# Patient Record
Sex: Male | Born: 1965
Health system: Southern US, Community
[De-identification: ages and names within clinical notes are randomized; demographics above are authoritative.]

## PROBLEM LIST (undated history)

## (undated) DIAGNOSIS — M722 Plantar fascial fibromatosis: Secondary | ICD-10-CM

## (undated) DIAGNOSIS — Z8639 Personal history of other endocrine, nutritional and metabolic disease: Secondary | ICD-10-CM

## (undated) DIAGNOSIS — G629 Polyneuropathy, unspecified: Secondary | ICD-10-CM

## (undated) DIAGNOSIS — E119 Type 2 diabetes mellitus without complications: Secondary | ICD-10-CM

## (undated) DIAGNOSIS — I10 Essential (primary) hypertension: Secondary | ICD-10-CM

## (undated) DIAGNOSIS — N289 Disorder of kidney and ureter, unspecified: Secondary | ICD-10-CM

## (undated) DIAGNOSIS — N200 Calculus of kidney: Secondary | ICD-10-CM

## (undated) DIAGNOSIS — M1990 Unspecified osteoarthritis, unspecified site: Secondary | ICD-10-CM

## (undated) DIAGNOSIS — N183 Chronic kidney disease, stage 3 unspecified: Secondary | ICD-10-CM

## (undated) DIAGNOSIS — E785 Hyperlipidemia, unspecified: Secondary | ICD-10-CM

## (undated) DIAGNOSIS — R351 Nocturia: Secondary | ICD-10-CM

## (undated) DIAGNOSIS — M549 Dorsalgia, unspecified: Secondary | ICD-10-CM

## (undated) DIAGNOSIS — N201 Calculus of ureter: Secondary | ICD-10-CM

## (undated) DIAGNOSIS — G8929 Other chronic pain: Secondary | ICD-10-CM

## (undated) DIAGNOSIS — M542 Cervicalgia: Secondary | ICD-10-CM

## (undated) HISTORY — PX: CARDIOVASCULAR STRESS TEST: SHX262

## (undated) HISTORY — DX: Calculus of kidney: N20.0

## (undated) HISTORY — PX: ANTERIOR CERVICAL DECOMP/DISCECTOMY FUSION: SHX1161

## (undated) HISTORY — DX: Personal history of other endocrine, nutritional and metabolic disease: Z86.39

## (undated) HISTORY — DX: Essential (primary) hypertension: I10

---

## 2013-03-04 ENCOUNTER — Emergency Department: Payer: Self-pay | Admitting: Emergency Medicine

## 2013-03-04 LAB — CBC
HCT: 41.8 % (ref 40.0–52.0)
MCH: 30.5 pg (ref 26.0–34.0)
MCHC: 35.2 g/dL (ref 32.0–36.0)
RBC: 4.81 10*6/uL (ref 4.40–5.90)
WBC: 9 10*3/uL (ref 3.8–10.6)

## 2013-03-04 LAB — BASIC METABOLIC PANEL
Anion Gap: 7 (ref 7–16)
Calcium, Total: 8.4 mg/dL — ABNORMAL LOW (ref 8.5–10.1)
Co2: 26 mmol/L (ref 21–32)
Creatinine: 1.36 mg/dL — ABNORMAL HIGH (ref 0.60–1.30)
EGFR (African American): >60
Glucose: 239 mg/dL — ABNORMAL HIGH (ref 65–99)
Sodium: 135 mmol/L — ABNORMAL LOW (ref 136–145)

## 2013-03-04 LAB — URINALYSIS, COMPLETE
Glucose,UR: >=500 mg/dL (ref 0–75)
Leukocyte Esterase: NEGATIVE
Nitrite: NEGATIVE
Protein: NEGATIVE
RBC,UR: 10 /HPF (ref 0–5)
Specific Gravity: 1.029 (ref 1.003–1.030)
Squamous Epithelial: 1
WBC UR: 10 /HPF (ref 0–5)

## 2013-04-13 ENCOUNTER — Emergency Department: Payer: Self-pay

## 2013-04-13 LAB — URINALYSIS, COMPLETE
Glucose,UR: >=500 mg/dL (ref 0–75)
Ketone: NEGATIVE
Protein: 30
Specific Gravity: 1.037 (ref 1.003–1.030)
Squamous Epithelial: NONE SEEN
WBC UR: 1 /HPF (ref 0–5)

## 2013-04-13 LAB — CBC
HCT: 41.9 % (ref 40.0–52.0)
HGB: 14.9 g/dL (ref 13.0–18.0)
MCH: 30.1 pg (ref 26.0–34.0)
MCV: 85 fL (ref 80–100)
RBC: 4.94 10*6/uL (ref 4.40–5.90)
RDW: 13.1 % (ref 11.5–14.5)

## 2013-04-13 LAB — BASIC METABOLIC PANEL
Calcium, Total: 9.3 mg/dL (ref 8.5–10.1)
Chloride: 104 mmol/L (ref 98–107)
Co2: 28 mmol/L (ref 21–32)
EGFR (African American): >60
Osmolality: 284 (ref 275–301)

## 2015-06-14 ENCOUNTER — Ambulatory Visit (INDEPENDENT_AMBULATORY_CARE_PROVIDER_SITE_OTHER): Payer: 59

## 2015-06-14 ENCOUNTER — Ambulatory Visit (INDEPENDENT_AMBULATORY_CARE_PROVIDER_SITE_OTHER): Payer: 59 | Admitting: Podiatry

## 2015-06-14 DIAGNOSIS — M79673 Pain in unspecified foot: Secondary | ICD-10-CM

## 2015-06-14 DIAGNOSIS — M722 Plantar fascial fibromatosis: Secondary | ICD-10-CM

## 2015-06-14 MED ORDER — DICLOFENAC SODIUM 75 MG PO TBEC: 75.0000 mg | DELAYED_RELEASE_TABLET | Freq: Two times a day (BID) | ORAL | Status: DC

## 2015-06-14 MED ORDER — TRIAMCINOLONE ACETONIDE 10 MG/ML IJ SUSP
10.0000 mg | Freq: Once | INTRAMUSCULAR | Status: AC
  Administered 2015-06-14: 10 mg

## 2015-06-14 NOTE — Progress Notes (Signed)
   Subjective:    Patient ID: Brian Butler, male    DOB: Jul 03, 1966, 49 y.o.   MRN: [id number]  HPI Pt presents with bilateral heel pain left over right. He states the pain is worse when ambulating after resting. He has tried otc orthotics, nsaids and stretching with no relief. This has been an ongoing problem worsening over several months   Review of Systems  Musculoskeletal: Positive for myalgias, back pain and gait problem.       Objective:   Physical Exam        Assessment & Plan:

## 2015-06-14 NOTE — Progress Notes (Signed)
Subjective:     Patient ID: Brian Butler, male   DOB: [birth year], 49 y.o.   MRN: [id number]  HPI patient states that I have had terrible pain in my plantar fascia and heel left over right for about 6 months and is been worsening recently. Patient states that he's tried different treatment options and tried support without relief of symptoms   Review of Systems  All other systems reviewed and are negative.      Objective:   Physical Exam  Constitutional: He is oriented to person, place, and time.  Cardiovascular: Intact distal pulses.   Musculoskeletal: Normal range of motion.  Neurological: He is oriented to person, place, and time.  Skin: Skin is warm.  Nursing note and vitals reviewed.  neurovascular status intact muscle strength adequate with range of motion within normal limits. Patient's noted to have significant discomfort in the plantar fascia and heel left over right with inflammation and fluid buildup noted around the medial band and moderate depression of the arch noted upon weightbearing. Patient is noted to have good digital perfusion and is well oriented 3     Assessment:     Plantar fasciitis with acute inflammation heel left over right with fluid buildup around the medial    Plan:     H&P and x-ray reviewed and today I injected the left plantar fascia 3 mg Kenalog 5 mg Xylocaine and applied fascial brace with instructions on usage. Patient was given instructions on physical therapy was placed on diclofenac 75 mg twice a day and will be rechecked again in 3 weeks

## 2015-06-14 NOTE — Patient Instructions (Addendum)

## 2015-07-05 ENCOUNTER — Ambulatory Visit: Payer: 59 | Admitting: Podiatry

## 2015-09-05 ENCOUNTER — Ambulatory Visit (INDEPENDENT_AMBULATORY_CARE_PROVIDER_SITE_OTHER): Payer: 59 | Admitting: Family Medicine

## 2015-09-05 ENCOUNTER — Encounter: Payer: Self-pay | Admitting: Family Medicine

## 2015-09-05 VITALS — BP 158/86 | HR 72 | Ht 72.0 in | Wt 225.8 lb

## 2015-09-05 DIAGNOSIS — M2550 Pain in unspecified joint: Secondary | ICD-10-CM

## 2015-09-05 DIAGNOSIS — N200 Calculus of kidney: Secondary | ICD-10-CM | POA: Diagnosis not present

## 2015-09-05 DIAGNOSIS — Z823 Family history of stroke: Secondary | ICD-10-CM

## 2015-09-05 DIAGNOSIS — M659 Synovitis and tenosynovitis, unspecified: Secondary | ICD-10-CM | POA: Diagnosis not present

## 2015-09-05 DIAGNOSIS — Z8639 Personal history of other endocrine, nutritional and metabolic disease: Secondary | ICD-10-CM | POA: Diagnosis not present

## 2015-09-05 DIAGNOSIS — I10 Essential (primary) hypertension: Secondary | ICD-10-CM | POA: Diagnosis not present

## 2015-09-05 DIAGNOSIS — E1165 Type 2 diabetes mellitus with hyperglycemia: Secondary | ICD-10-CM | POA: Diagnosis not present

## 2015-09-05 DIAGNOSIS — R5383 Other fatigue: Secondary | ICD-10-CM

## 2015-09-05 DIAGNOSIS — E119 Type 2 diabetes mellitus without complications: Secondary | ICD-10-CM | POA: Insufficient documentation

## 2015-09-05 DIAGNOSIS — R739 Hyperglycemia, unspecified: Secondary | ICD-10-CM | POA: Diagnosis not present

## 2015-09-05 DIAGNOSIS — IMO0001 Reserved for inherently not codable concepts without codable children: Secondary | ICD-10-CM

## 2015-09-05 DIAGNOSIS — R358 Other polyuria: Secondary | ICD-10-CM

## 2015-09-05 DIAGNOSIS — R3589 Other polyuria: Secondary | ICD-10-CM

## 2015-09-05 DIAGNOSIS — M778 Other enthesopathies, not elsewhere classified: Secondary | ICD-10-CM

## 2015-09-05 HISTORY — DX: Essential (primary) hypertension: I10

## 2015-09-05 HISTORY — DX: Personal history of other endocrine, nutritional and metabolic disease: Z86.39

## 2015-09-05 LAB — POCT URINALYSIS DIPSTICK
Bilirubin, UA: NEGATIVE
Blood, UA: NEGATIVE
KETONES UA: NEGATIVE
Leukocytes, UA: NEGATIVE
Nitrite, UA: NEGATIVE
Protein, UA: NEGATIVE
SPEC GRAV UA: 1.015
Urobilinogen, UA: 0.2
pH, UA: 5

## 2015-09-05 LAB — GLUCOSE, POCT (MANUAL RESULT ENTRY): POC Glucose: 239 mg/dl — AB (ref 70–99)

## 2015-09-05 MED ORDER — METFORMIN HCL 500 MG PO TABS: 500.0000 mg | ORAL_TABLET | Freq: Two times a day (BID) | ORAL | Status: DC

## 2015-09-05 MED ORDER — LISINOPRIL 10 MG PO TABS: 10.0000 mg | ORAL_TABLET | Freq: Every day | ORAL | Status: DC

## 2015-09-05 NOTE — Progress Notes (Signed)
Date:  09/05/2015   Name:  Brian Butler   DOB:  March 10, 1966   MRN:  [id number]  PCP:  Adline Potter, MD    Chief Complaint: Establish Care   History of Present Illness:  This is a 49 y.o. male with hx diabetes and HTN previously controlled with weight loss but has regained weight now. Had sweet tea before office visit. Currently c/o polyuria, diffuse arthralgia, fatigue, L flank pain, and R elbow pain. Hx low testosterone on Androgel 3 yrs ago, quit due to palpitations. Last tetanus 1 yr ago, declines flu imm. Recent cortisone injections both heels by podiatry for plantar fasciitis (help for a while).  Review of Systems:  Review of Systems  Constitutional: Negative for fever and chills.  Respiratory: Negative for shortness of breath.   Cardiovascular: Negative for chest pain and leg swelling.  Gastrointestinal: Negative for abdominal pain.  Genitourinary: Negative for hematuria and testicular pain.  Skin: Negative for rash.  Neurological: Negative for syncope and light-headedness.    Patient Active Problem List   Diagnosis Date Noted  . FH: stroke 09/05/2015  . Hypertension 09/05/2015  . Nephrolithiasis 09/05/2015  . Hx of hypotestosteronemia 09/05/2015  . Hyperglycemia 09/05/2015  . Uncontrolled type 2 diabetes mellitus without complication ([place]) 25/00/3704    Prior to Admission medications   Medication Sig Start Date End Date Taking? Authorizing Provider  lisinopril (PRINIVIL,ZESTRIL) 10 MG tablet Take 1 tablet (10 mg total) by mouth daily. 09/05/15   Adline Potter, MD  metFORMIN (GLUCOPHAGE) 500 MG tablet Take 1 tablet (500 mg total) by mouth 2 (two) times daily with a meal. 09/05/15   Adline Potter, MD    No Known Allergies  Past Surgical History  Procedure Laterality Date  . Cervical disc surgery  2006    Social History  Substance Use Topics  . Smoking status: Never Smoker   . Smokeless tobacco: None  . Alcohol Use: No    Family History  Problem Relation Age  of Onset  . Stroke Father     Medication list has been reviewed and updated.  Physical Examination: BP 158/86 mmHg  Pulse 72  Ht 6' (1.829 m)  Wt 225 lb 12.8 oz (102.422 kg)  BMI 30.62 kg/m2  Physical Exam  Constitutional: He is oriented to person, place, and time. He appears well-developed and well-nourished.  HENT:  Head: Normocephalic and atraumatic.  Right Ear: External ear normal.  Left Ear: External ear normal.  Mouth/Throat: Oropharynx is clear and moist.  B TM's clear  Eyes: Conjunctivae and EOM are normal. Pupils are equal, round, and reactive to light. No scleral icterus.  Neck: Neck supple. No thyromegaly present.  Cardiovascular: Normal rate, regular rhythm, normal heart sounds and intact distal pulses.   Pulmonary/Chest: Effort normal and breath sounds normal.  Abdominal: Soft. He exhibits no distension and no mass. There is no tenderness.  Genitourinary:  Mild L CVAT  Musculoskeletal: He exhibits no edema.  R elbow tender at biceps and triceps insertions  Lymphadenopathy:    He has no cervical adenopathy.  Neurological: He is alert and oriented to person, place, and time. Coordination normal.  Skin: Skin is warm and dry.  Psychiatric: He has a normal mood and affect. His behavior is normal.  Nursing note and vitals reviewed.   Assessment and Plan:  1. Uncontrolled type 2 diabetes mellitus without complication, without long-term current use of insulin (HCC) UA 2000+ glucose, FSBG 239, likely recurrent due to weight gain but cortisone and diet  may have excerbated, begin metformin, consider MNT  2. Essential hypertension Poor control, begin lisinopril - Comprehensive metabolic panel  3. Nephrolithiasis Suspect L flank pain due to polyuria which should improve with DM control  4. Left elbow tendonitis Consider NSAID if blood work ok  5. Other fatigue Suspect due to uncontrolled DM - CBC - TSH  6. Arthralgia No inflammatory sxs - Sed Rate  (ESR)  7. FH: stroke Father age 33  8. Hx of hypotestosteronemia On Androgel in past - Testosterone  9. Hyperglycemia - HgB A1c  10. Polyuria - POCT urinalysis dipstick - POCT Glucose (CBG)  Return in about 2 weeks (around 09/19/2015).  Satira Anis. Nichola Cieslinski, [facility]  09/05/2015

## 2015-09-06 LAB — COMPREHENSIVE METABOLIC PANEL
ALT: 67 IU/L — ABNORMAL HIGH (ref 0–44)
AST: 38 IU/L (ref 0–40)
Albumin/Globulin Ratio: 1.5 (ref 1.1–2.5)
Albumin: 4.2 g/dL (ref 3.5–5.5)
Alkaline Phosphatase: 88 IU/L (ref 39–117)
BUN/Creatinine Ratio: 11 (ref 9–20)
BUN: 14 mg/dL (ref 6–24)
Bilirubin Total: 1.1 mg/dL (ref 0.0–1.2)
CALCIUM: 9.8 mg/dL (ref 8.7–10.2)
CO2: 24 mmol/L (ref 18–29)
CREATININE: 1.26 mg/dL (ref 0.76–1.27)
Chloride: 96 mmol/L — ABNORMAL LOW (ref 97–108)
GFR calc Af Amer: 77 mL/min/{1.73_m2} (ref >59)
GFR, EST NON AFRICAN AMERICAN: 67 mL/min/{1.73_m2} (ref >59)
GLOBULIN, TOTAL: 2.8 g/dL (ref 1.5–4.5)
GLUCOSE: 255 mg/dL — AB (ref 65–99)
Potassium: 4.4 mmol/L (ref 3.5–5.2)
Sodium: 138 mmol/L (ref 134–144)
Total Protein: 7 g/dL (ref 6.0–8.5)

## 2015-09-06 LAB — CBC
HEMOGLOBIN: 14.7 g/dL (ref 12.6–17.7)
Hematocrit: 43.4 % (ref 37.5–51.0)
MCH: 29.9 pg (ref 26.6–33.0)
MCHC: 33.9 g/dL (ref 31.5–35.7)
MCV: 88 fL (ref 79–97)
Platelets: 196 10*3/uL (ref 150–379)
RBC: 4.92 x10E6/uL (ref 4.14–5.80)
RDW: 13.9 % (ref 12.3–15.4)
WBC: 7 10*3/uL (ref 3.4–10.8)

## 2015-09-06 LAB — HEMOGLOBIN A1C
Est. average glucose Bld gHb Est-mCnc: 174 mg/dL
HEMOGLOBIN A1C: 7.7 % — AB (ref 4.8–5.6)

## 2015-09-06 LAB — TESTOSTERONE: Testosterone: 244 ng/dL — ABNORMAL LOW (ref 348–1197)

## 2015-09-06 LAB — SEDIMENTATION RATE: SED RATE: 6 mm/h (ref 0–15)

## 2015-09-06 LAB — TSH: TSH: 2.14 u[IU]/mL (ref 0.450–4.500)

## 2015-09-07 ENCOUNTER — Telehealth: Payer: Self-pay

## 2015-09-07 NOTE — Telephone Encounter (Signed)
Ok, thanks.

## 2015-09-07 NOTE — Telephone Encounter (Signed)
Sent to [facility] 

## 2015-09-08 ENCOUNTER — Ambulatory Visit: Payer: 59

## 2015-09-08 ENCOUNTER — Ambulatory Visit
Admission: EM | Admit: 2015-09-08 | Discharge: 2015-09-08 | Disposition: A | Discharge status: Home / Self Care | Payer: 59 | Attending: Family Medicine | Admitting: Family Medicine

## 2015-09-08 ENCOUNTER — Encounter: Payer: Self-pay | Admitting: Emergency Medicine

## 2015-09-08 DIAGNOSIS — M25521 Pain in right elbow: Secondary | ICD-10-CM

## 2015-09-08 DIAGNOSIS — E1165 Type 2 diabetes mellitus with hyperglycemia: Secondary | ICD-10-CM

## 2015-09-08 DIAGNOSIS — IMO0001 Reserved for inherently not codable concepts without codable children: Secondary | ICD-10-CM

## 2015-09-08 DIAGNOSIS — N200 Calculus of kidney: Secondary | ICD-10-CM | POA: Diagnosis not present

## 2015-09-08 LAB — URINALYSIS COMPLETE WITH MICROSCOPIC (ARMC ONLY)
BILIRUBIN URINE: NEGATIVE
Bacteria, UA: NONE SEEN — AB
Glucose, UA: NEGATIVE mg/dL
HGB URINE DIPSTICK: NEGATIVE
KETONES UR: NEGATIVE mg/dL
LEUKOCYTES UA: NEGATIVE
NITRITE: NEGATIVE
PH: 6 (ref 5.0–8.0)
Protein, ur: NEGATIVE mg/dL
SPECIFIC GRAVITY, URINE: 1.025 (ref 1.005–1.030)
Squamous Epithelial / LPF: NONE SEEN — AB

## 2015-09-08 MED ORDER — ACETAMINOPHEN 500 MG PO TABS: 1000.0000 mg | ORAL_TABLET | Freq: Four times a day (QID) | ORAL | Status: DC | PRN

## 2015-09-08 MED ORDER — TAMSULOSIN HCL 0.4 MG PO CAPS: 0.4000 mg | ORAL_CAPSULE | Freq: Every day | ORAL | Status: DC

## 2015-09-08 MED ORDER — ONDANSETRON HCL 8 MG PO TABS: 8.0000 mg | ORAL_TABLET | Freq: Two times a day (BID) | ORAL | Status: DC | PRN

## 2015-09-08 MED ORDER — HYDROCODONE-ACETAMINOPHEN 5-325 MG PO TABS: 1.0000 | ORAL_TABLET | Freq: Four times a day (QID) | ORAL | Status: DC | PRN

## 2015-09-08 NOTE — ED Notes (Signed)
Pt with right flank pain

## 2015-09-08 NOTE — Discharge Instructions (Signed)
Dietary Guidelines to Help Prevent Kidney Stones Your risk of kidney stones can be decreased by adjusting the foods you eat. The most important thing you can do is drink enough fluid. You should drink enough fluid to keep your urine clear or pale yellow. The following guidelines provide specific information for the type of kidney stone you have had. GUIDELINES ACCORDING TO TYPE OF KIDNEY STONE Calcium Oxalate Kidney Stones  Reduce the amount of salt you eat. Foods that have a lot of salt cause your body to release excess calcium into your urine. The excess calcium can combine with a substance called oxalate to form kidney stones.  Reduce the amount of animal protein you eat if the amount you eat is excessive. Animal protein causes your body to release excess calcium into your urine. Ask your dietitian how much protein from animal sources you should be eating.  Avoid foods that are high in oxalates. If you take vitamins, they should have less than 500 mg of vitamin C. Your body turns vitamin C into oxalates. You do not need to avoid fruits and vegetables high in vitamin C. Calcium Phosphate Kidney Stones  Reduce the amount of salt you eat to help prevent the release of excess calcium into your urine.  Reduce the amount of animal protein you eat if the amount you eat is excessive. Animal protein causes your body to release excess calcium into your urine. Ask your dietitian how much protein from animal sources you should be eating.  Get enough calcium from food or take a calcium supplement (ask your dietitian for recommendations). Food sources of calcium that do not increase your risk of kidney stones include:  Broccoli.  Dairy products, such as cheese and yogurt.  Pudding. Uric Acid Kidney Stones  Do not have more than 6 oz of animal protein per day. FOOD SOURCES Animal Protein Sources  Meat (all types).  Poultry.  Eggs.  Fish, seafood. Foods High in [employer] seasonings.  Soy  sauce.  Teriyaki sauce.  Cured and processed meats.  Salted crackers and snack foods.  Fast food.  Canned soups and most canned foods. Foods High in Oxalates  Grains:  Amaranth.  Barley.  Grits.  Wheat germ.  Bran.  Buckwheat flour.  All bran cereals.  Pretzels.  Whole wheat bread.  Vegetables:  Beans (wax).  Beets and beet greens.  Collard greens.  Eggplant.  Escarole.  Leeks.  Okra.  Parsley.  Rutabagas.  Spinach.  Swiss chard.  Tomato paste.  Fried potatoes.  Sweet potatoes.  Fruits:  Red currants.  Figs.  Kiwi.  Rhubarb.  Meat and Other Protein Sources:  Beans (dried).  Soy burgers and other soybean products.  Miso.  Nuts (peanuts, almonds, pecans, cashews, hazelnuts).  Nut butters.  Sesame seeds and tahini (paste made of sesame seeds).  Poppy seeds.  Beverages:  Chocolate drink mixes.  Soy milk.  Instant iced tea.  Juices made from high-oxalate fruits or vegetables.  Other:  Carob.  Chocolate.  Fruitcake.  Marmalades.   This information is not intended to replace advice given to you by your health care provider. Make sure you discuss any questions you have with your health care provider.   Document Released: 03/14/2011 Document Revised: 11/22/2013 Document Reviewed: 10/14/2013 Elsevier Interactive Patient Education 2016 [employer]. Arthritis Arthritis is a term that is commonly used to refer to joint pain or joint disease. There are more than 100 types of arthritis. CAUSES The most common cause of this  condition is wear and tear of a joint. Other causes include:  Gout.  Inflammation of a joint.  An infection of a joint.  Sprains and other injuries near the joint.  A drug reaction or allergic reaction. In some cases, the cause may not be known. SYMPTOMS The main symptom of this condition is pain in the joint with movement. Other symptoms include:  Redness, swelling, or stiffness  at a joint.  Warmth coming from the joint.  Fever.  Overall feeling of illness. DIAGNOSIS This condition may be diagnosed with a physical exam and tests, including:  Blood tests.  Urine tests.  Imaging tests, such as MRI, X-rays, or a CT scan. Sometimes, fluid is removed from a joint for testing. TREATMENT Treatment for this condition may involve:  Treatment of the cause, if it is known.  Rest.  Raising (elevating) the joint.  Applying cold or hot packs to the joint.  Medicines to improve symptoms and reduce inflammation.  Injections of a steroid such as cortisone into the joint to help reduce pain and inflammation. Depending on the cause of your arthritis, you may need to make lifestyle changes to reduce stress on your joint. These changes may include exercising more and losing weight. HOME CARE INSTRUCTIONS Medicines  Take over-the-counter and prescription medicines only as told by your health care provider.  Do not take aspirin to relieve pain if gout is suspected. Activities  Rest your joint if told by your health care provider. Rest is important when your disease is active and your joint feels painful, swollen, or stiff.  Avoid activities that make the pain worse. It is important to balance activity with rest.  Exercise your joint regularly with range-of-motion exercises as told by your health care provider. Try doing low-impact exercise, such as:  Swimming.  Water aerobics.  Biking.  Walking. Joint Care  If your joint is swollen, keep it elevated if told by your health care provider.  If your joint feels stiff in the morning, try taking a warm shower.  If directed, apply heat to the joint. If you have diabetes, do not apply heat without permission from your health care provider.  Put a towel between the joint and the hot pack or heating pad.  Leave the heat on the area for 20-30 minutes.  If directed, apply ice to the joint:  Put ice in a  plastic bag.  Place a towel between your skin and the bag.  Leave the ice on for 20 minutes, 2-3 times per day.  Keep all follow-up visits as told by your health care provider. This is important. SEEK MEDICAL CARE IF:  The pain gets worse.  You have a fever. SEEK IMMEDIATE MEDICAL CARE IF:  You develop severe joint pain, swelling, or redness.  Many joints become painful and swollen.  You develop severe back pain.  You develop severe weakness in your leg.  You cannot control your bladder or bowels.   This information is not intended to replace advice given to you by your health care provider. Make sure you discuss any questions you have with your health care provider.   Document Released: 12/25/2004 Document Revised: 08/08/2015 Document Reviewed: 02/12/2015 Elsevier Interactive Patient Education 2016 Else[employer] Inc[employer] Kidney Stones Kidney stones (urolithiasis) are deposits that form inside your kidneys. The intense pain is caused by the stone moving through the urinary tract. When the stone moves, the ureter goes into spasm around the stone. The stone is usually passed in the urine.  CAUSES   A disorder that makes certain neck glands produce too much parathyroid hormone (primary hyperparathyroidism).  A buildup of uric acid crystals, similar to gout in your joints.  Narrowing (stricture) of the ureter.  A kidney obstruction present at birth (congenital obstruction).  Previous surgery on the kidney or ureters.  Numerous kidney infections. SYMPTOMS   Feeling sick to your stomach (nauseous).  Throwing up (vomiting).  Blood in the urine (hematuria).  Pain that usually spreads (radiates) to the groin.  Frequency or urgency of urination. DIAGNOSIS   Taking a history and physical exam.  Blood or urine tests.  CT scan.  Occasionally, an examination of the inside of the urinary bladder (cystoscopy) is performed. TREATMENT   Observation.  Increasing your fluid  intake.  Extracorporeal shock wave lithotripsy--This is a noninvasive procedure that uses shock waves to break up kidney stones.  Surgery may be needed if you have severe pain or persistent obstruction. There are various surgical procedures. Most of the procedures are performed with the use of small instruments. Only small incisions are needed to accommodate these instruments, so recovery time is minimized. The size, location, and chemical composition are all important variables that will determine the proper choice of action for you. Talk to your health care provider to better understand your situation so that you will minimize the risk of injury to yourself and your kidney.  HOME CARE INSTRUCTIONS   Drink enough water and fluids to keep your urine clear or pale yellow. This will help you to pass the stone or stone fragments.  Strain all urine through the provided strainer. Keep all particulate matter and stones for your health care provider to see. The stone causing the pain may be as small as a grain of salt. It is very important to use the strainer each and every time you pass your urine. The collection of your stone will allow your health care provider to analyze it and verify that a stone has actually passed. The stone analysis will often identify what you can do to reduce the incidence of recurrences.  Only take over-the-counter or prescription medicines for pain, discomfort, or fever as directed by your health care provider.  Keep all follow-up visits as told by your health care provider. This is important.  Get follow-up X-rays if required. The absence of pain does not always mean that the stone has passed. It may have only stopped moving. If the urine remains completely obstructed, it can cause loss of kidney function or even complete destruction of the kidney. It is your responsibility to make sure X-rays and follow-ups are completed. Ultrasounds of the kidney can show blockages and the  status of the kidney. Ultrasounds are not associated with any radiation and can be performed easily in a matter of minutes.  Make changes to your daily diet as told by your health care provider. You may be told to:  Limit the amount of salt that you eat.  Eat 5 or more servings of fruits and vegetables each day.  Limit the amount of meat, poultry, fish, and eggs that you eat.  Collect a 24-hour urine sample as told by your health care provider.You may need to collect another urine sample every 6-12 months. SEEK MEDICAL CARE IF:  You experience pain that is progressive and unresponsive to any pain medicine you have been prescribed. SEEK IMMEDIATE MEDICAL CARE IF:   Pain cannot be controlled with the prescribed medicine.  You have a fever or shaking chills.  The severity or intensity of pain increases over 18 hours and is not relieved by pain medicine.  You develop a new onset of abdominal pain.  You feel faint or pass out.  You are unable to urinate.   This information is not intended to replace advice given to you by your health care provider. Make sure you discuss any questions you have with your health care provider.   Document Released: 11/17/2005 Document Revised: 08/08/2015 Document Reviewed: 04/20/2013 Elsevier Interactive Patient Education 2016 Elsevier Inc.  [employer] Therapy Heat therapy can help ease sore, stiff, injured, and tight muscles and joints. Heat relaxes your muscles, which may help ease your pain.  RISKS AND COMPLICATIONS If you have any of the following conditions, do not use heat therapy unless your health care provider has approved:  Poor circulation.  Healing wounds or scarred skin in the area being treated.  Diabetes, heart disease, or high blood pressure.  Not being able to feel (numbness) the area being treated.  Unusual swelling of the area being treated.  Active infections.  Blood clots.  Cancer.  Inability to communicate pain. This may  include young children and people who have problems with their brain function (dementia).  Pregnancy. Heat therapy should only be used on old, pre-existing, or long-lasting (chronic) injuries. Do not use heat therapy on new injuries unless directed by your health care provider. HOW TO USE HEAT THERAPY There are several different kinds of heat therapy, including:  Moist heat pack.  Warm water bath.  Hot water bottle.  Electric heating pad.  Heated gel pack.  Heated wrap.  Electric heating pad. Use the heat therapy method suggested by your health care provider. Follow your health care provider's instructions on when and how to use heat therapy. GENERAL HEAT THERAPY RECOMMENDATIONS  Do not sleep while using heat therapy. Only use heat therapy while you are awake.  Your skin may turn pink while using heat therapy. Do not use heat therapy if your skin turns red.  Do not use heat therapy if you have new pain.  High heat or long exposure to heat can cause burns. Be careful when using heat therapy to avoid burning your skin.  Do not use heat therapy on areas of your skin that are already irritated, such as with a rash or sunburn. SEEK MEDICAL CARE IF:  You have blisters, redness, swelling, or numbness.  You have new pain.  Your pain is worse. MAKE SURE YOU:  Understand these instructions.  Will watch your condition.  Will get help right away if you are not doing well or get worse.   This information is not intended to replace advice given to you by your health care provider. Make sure you discuss any questions you have with your health care provider.   Document Released: 02/09/2012 Document Revised: 12/08/2014 Document Reviewed: 01/10/2014 Elsevier Interactive Patient Education 2016 Elsevier Inc. Lateral Epicondylitis With Rehab Lateral epicondylitis involves inflammation and pain around the outer portion of the elbow. The pain is caused by inflammation of the tendons in  the forearm that bring back (extend) the wrist. Lateral epicondylitis is also called tennis elbow, because it is very common in tennis players. However, it may occur in any individual who extends the wrist repetitively. If lateral epicondylitis is left untreated, it may become a chronic problem. SYMPTOMS   Pain, tenderness, and inflammation on the outer (lateral) side of the elbow.  Pain or weakness with gripping activities.  Pain that increases with wrist-twisting  motions (playing tennis, using a screwdriver, opening a door or a jar).  Pain with lifting objects, including a coffee cup. CAUSES  Lateral epicondylitis is caused by inflammation of the tendons that extend the wrist. Causes of injury may include:  Repetitive stress and strain on the muscles and tendons that extend the wrist.  Sudden change in activity level or intensity.  Incorrect grip in racquet sports.  Incorrect grip size of racquet (often too large).  Incorrect hitting position or technique (usually backhand, leading with the elbow).  Using a racket that is too heavy. RISK INCREASES WITH:  Sports or occupations that require repetitive and/or strenuous forearm and wrist movements (tennis, squash, racquetball, carpentry).  Poor wrist and forearm strength and flexibility.  Failure to warm up properly before activity.  Resuming activity before healing, rehabilitation, and conditioning are complete. PREVENTION   Warm up and stretch properly before activity.  Maintain physical fitness:  Strength, flexibility, and endurance.  Cardiovascular fitness.  Wear and use properly fitted equipment.  Learn and use proper technique and have a coach correct improper technique.  Wear a tennis elbow (counterforce) brace. PROGNOSIS  The course of this condition depends on the degree of the injury. If treated properly, acute cases (symptoms lasting less than 4 weeks) are often resolved in 2 to 6 weeks. Chronic (longer  lasting cases) often resolve in 3 to 6 months but may require physical therapy. RELATED COMPLICATIONS   Frequently recurring symptoms, resulting in a chronic problem. Properly treating the problem the first time decreases frequency of recurrence.  Chronic inflammation, scarring tendon degeneration, and partial tendon tear, requiring surgery.  Delayed healing or resolution of symptoms. TREATMENT  Treatment first involves the use of ice and medicine to reduce pain and inflammation. Strengthening and stretching exercises may help reduce discomfort if performed regularly. These exercises may be performed at home if the condition is an acute injury. Chronic cases may require a referral to a physical therapist for evaluation and treatment. Your caregiver may advise a corticosteroid injection to help reduce inflammation. Rarely, surgery is needed. MEDICATION  If pain medicine is needed, nonsteroidal anti-inflammatory medicines (aspirin and ibuprofen), or other minor pain relievers (acetaminophen), are often advised.  Do not take pain medicine for 7 days before surgery.  Prescription pain relievers may be given, if your caregiver thinks they are needed. Use only as directed and only as much as you need.  Corticosteroid injections may be recommended. These injections should be reserved only for the most severe cases, because they can only be given a certain number of times. HEAT AND COLD  Cold treatment (icing) should be applied for 10 to 15 minutes every 2 to 3 hours for inflammation and pain, and immediately after activity that aggravates your symptoms. Use ice packs or an ice massage.  Heat treatment may be used before performing stretching and strengthening activities prescribed by your caregiver, physical therapist, or athletic trainer. Use a heat pack or a warm water soak. SEEK MEDICAL CARE IF: Symptoms get worse or do not improve in 2 weeks, despite treatment. EXERCISES  RANGE OF MOTION  (ROM) AND STRETCHING EXERCISES - Epicondylitis, Lateral (Tennis Elbow) These exercises may help you when beginning to rehabilitate your injury. Your symptoms may go away with or without further involvement from your physician, physical therapist, or athletic trainer. While completing these exercises, remember:   Restoring tissue flexibility helps normal motion to return to the joints. This allows healthier, less painful movement and activity.  An effective stretch  should be held for at least 30 seconds.  A stretch should never be painful. You should only feel a gentle lengthening or release in the stretched tissue. RANGE OF MOTION - Wrist Flexion, Active-Assisted  Extend your right / left elbow with your fingers pointing down.*  Gently pull the back of your hand towards you, until you feel a gentle stretch on the top of your forearm.  Hold this position for __________ seconds. Repeat __________ times. Complete this exercise __________ times per day.  *If directed by your physician, physical therapist or athletic trainer, complete this stretch with your elbow bent, rather than extended. RANGE OF MOTION - Wrist Extension, Active-Assisted  Extend your right / left elbow and turn your palm upwards.*  Gently pull your palm and fingertips back, so your wrist extends and your fingers point more toward the ground.  You should feel a gentle stretch on the inside of your forearm.  Hold this position for __________ seconds. Repeat __________ times. Complete this exercise __________ times per day. *If directed by your physician, physical therapist or athletic trainer, complete this stretch with your elbow bent, rather than extended. STRETCH - Wrist Flexion  Place the back of your right / left hand on a tabletop, leaving your elbow slightly bent. Your fingers should point away from your body.  Gently press the back of your hand down onto the table by straightening your elbow. You should feel a  stretch on the top of your forearm.  Hold this position for __________ seconds. Repeat __________ times. Complete this stretch __________ times per day.  STRETCH - Wrist Extension   Place your right / left fingertips on a tabletop, leaving your elbow slightly bent. Your fingers should point backwards.  Gently press your fingers and palm down onto the table by straightening your elbow. You should feel a stretch on the inside of your forearm.  Hold this position for __________ seconds. Repeat __________ times. Complete this stretch __________ times per day.  STRENGTHENING EXERCISES - Epicondylitis, Lateral (Tennis Elbow) These exercises may help you when beginning to rehabilitate your injury. They may resolve your symptoms with or without further involvement from your physician, physical therapist, or athletic trainer. While completing these exercises, remember:   Muscles can gain both the endurance and the strength needed for everyday activities through controlled exercises.  Complete these exercises as instructed by your physician, physical therapist or athletic trainer. Increase the resistance and repetitions only as guided.  You may experience muscle soreness or fatigue, but the pain or discomfort you are trying to eliminate should never worsen during these exercises. If this pain does get worse, stop and make sure you are following the directions exactly. If the pain is still present after adjustments, discontinue the exercise until you can discuss the trouble with your caregiver. STRENGTH - Wrist Flexors  Sit with your right / left forearm palm-up and fully supported on a table or countertop. Your elbow should be resting below the height of your shoulder. Allow your wrist to extend over the edge of the surface.  Loosely holding a __________ weight, or a piece of rubber exercise band or tubing, slowly curl your hand up toward your forearm.  Hold this position for __________ seconds.  Slowly lower the wrist back to the starting position in a controlled manner. Repeat __________ times. Complete this exercise __________ times per day.  STRENGTH - Wrist Extensors  Sit with your right / left forearm palm-down and fully supported on a table or  countertop. Your elbow should be resting below the height of your shoulder. Allow your wrist to extend over the edge of the surface.  Loosely holding a __________ weight, or a piece of rubber exercise band or tubing, slowly curl your hand up toward your forearm.  Hold this position for __________ seconds. Slowly lower the wrist back to the starting position in a controlled manner. Repeat __________ times. Complete this exercise __________ times per day.  STRENGTH - Ulnar Deviators  Stand with a ____________________ weight in your right / left hand, or sit while holding a rubber exercise band or tubing, with your healthy arm supported on a table or countertop.  Move your wrist, so that your pinkie travels toward your forearm and your thumb moves away from your forearm.  Hold this position for __________ seconds and then slowly lower the wrist back to the starting position. Repeat __________ times. Complete this exercise __________ times per day STRENGTH - Radial Deviators  Stand with a ____________________ weight in your right / left hand, or sit while holding a rubber exercise band or tubing, with your injured arm supported on a table or countertop.  Raise your hand upward in front of you or pull up on the rubber tubing.  Hold this position for __________ seconds and then slowly lower the wrist back to the starting position. Repeat __________ times. Complete this exercise __________ times per day. STRENGTH - Forearm Supinators   Sit with your right / left forearm supported on a table, keeping your elbow below shoulder height. Rest your hand over the edge, palm down.  Gently grip a hammer or a soup ladle.  Without moving your  elbow, slowly turn your palm and hand upward to a "thumbs-up" position.  Hold this position for __________ seconds. Slowly return to the starting position. Repeat __________ times. Complete this exercise __________ times per day.  STRENGTH - Forearm Pronators   Sit with your right / left forearm supported on a table, keeping your elbow below shoulder height. Rest your hand over the edge, palm up.  Gently grip a hammer or a soup ladle.  Without moving your elbow, slowly turn your palm and hand upward to a "thumbs-up" position.  Hold this position for __________ seconds. Slowly return to the starting position. Repeat __________ times. Complete this exercise __________ times per day.  STRENGTH - Grip  Grasp a tennis ball, a dense sponge, or a large, rolled sock in your hand.  Squeeze as hard as you can, without increasing any pain.  Hold this position for __________ seconds. Release your grip slowly. Repeat __________ times. Complete this exercise __________ times per day.  STRENGTH - Elbow Extensors, Isometric  Stand or sit upright, on a firm surface. Place your right / left arm so that your palm faces your stomach, and it is at the height of your waist.  Place your opposite hand on the underside of your forearm. Gently push up as your right / left arm resists. Push as hard as you can with both arms, without causing any pain or movement at your right / left elbow. Hold this stationary position for __________ seconds. Gradually release the tension in both arms. Allow your muscles to relax completely before repeating.   This information is not intended to replace advice given to you by your health care provider. Make sure you discuss any questions you have with your health care provider.   Document Released: 11/17/2005 Document Revised: 12/08/2014 Document Reviewed: 03/01/2009 Elsevier Interactive Patient Education  2016 Elsevier Inc. Basic Carbohydrate Counting for Diabetes  [place] Carbohydrate counting is a method for keeping track of the amount of carbohydrates you eat. Eating carbohydrates naturally increases the level of sugar (glucose) in your blood, so it is important for you to know the amount that is okay for you to have in every meal. Carbohydrate counting helps keep the level of glucose in your blood within normal limits. The amount of carbohydrates allowed is different for every person. A dietitian can help you calculate the amount that is right for you. Once you know the amount of carbohydrates you can have, you can count the carbohydrates in the foods you want to eat. Carbohydrates are found in the following foods:  Grains, such as breads and cereals.  Dried beans and soy products.  Starchy vegetables, such as potatoes, peas, and corn.  Fruit and fruit juices.  Milk and yogurt.  Sweets and snack foods, such as cake, cookies, candy, chips, soft drinks, and fruit drinks. CARBOHYDRATE COUNTING There are two ways to count the carbohydrates in your food. You can use either of the methods or a combination of both. Reading the "Nutrition Facts" on Packaged Food The "Nutrition Facts" is an area that is included on the labels of almost all packaged food and beverages in the [place]. It includes the serving size of that food or beverage and information about the nutrients in each serving of the food, including the grams (g) of carbohydrate per serving.  Decide the number of servings of this food or beverage that you will be able to eat or drink. Multiply that number of servings by the number of grams of carbohydrate that is listed on the label for that serving. The total will be the amount of carbohydrates you will be having when you eat or drink this food or beverage. Learning Standard Serving Sizes of Food When you eat food that is not packaged or does not include "Nutrition Facts" on the label, you need to measure the servings in order to count the  amount of carbohydrates.A serving of most carbohydrate-rich foods contains about 15 g of carbohydrates. The following list includes serving sizes of carbohydrate-rich foods that provide 15 g ofcarbohydrate per serving:   1 slice of bread (1 oz) or 1 six-inch tortilla.    of a hamburger bun or English muffin.  4-6 crackers.   cup unsweetened dry cereal.    cup hot cereal.   cup rice or pasta.    cup mashed potatoes or  of a large baked potato.  1 cup fresh fruit or one small piece of fruit.    cup canned or frozen fruit or fruit juice.  1 cup milk.   cup plain fat-free yogurt or yogurt sweetened with artificial sweeteners.   cup cooked dried beans or starchy vegetable, such as peas, corn, or potatoes.  Decide the number of standard-size servings that you will eat. Multiply that number of servings by 15 (the grams of carbohydrates in that serving). For example, if you eat 2 cups of strawberries, you will have eaten 2 servings and 30 g of carbohydrates (2 servings x 15 g = 30 g). For foods such as soups and casseroles, in which more than one food is mixed in, you will need to count the carbohydrates in each food that is included. EXAMPLE OF CARBOHYDRATE COUNTING Sample Dinner  3 oz chicken breast.   cup of brown rice.   cup of corn.  1 cup milk.  1 cup strawberries with sugar-free whipped topping.  Carbohydrate Calculation Step 1: Identify the foods that contain carbohydrates:   Rice.   Corn.   Milk.   Strawberries. Step 2:Calculate the number of servings eaten of each:   2 servings of rice.   1 serving of corn.   1 serving of milk.   1 serving of strawberries. Step 3: Multiply each of those number of servings by 15 g:   2 servings of rice x 15 g = 30 g.   1 serving of corn x 15 g = 15 g.   1 serving of milk x 15 g = 15 g.   1 serving of strawberries x 15 g = 15 g. Step 4: Add together all of the amounts to find the total  grams of carbohydrates eaten: 30 g + 15 g + 15 g + 15 g = 75 g.   This information is not intended to replace advice given to you by your health care provider. Make sure you discuss any questions you have with your health care provider.   Document Released: 11/17/2005 Document Revised: 12/08/2014 Document Reviewed: 10/14/2013 Elsevier Interactive Patient Education [employer]

## 2015-09-08 NOTE — ED Provider Notes (Signed)
CSN: [id number]     Arrival date & time 09/08/15  1238 History   First MD Initiated Contact with Patient 09/08/15 1314     Chief Complaint  Patient presents with  . 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  PMHx kidney stones, new Type II diabetes was started on metformin by Dr Hollace Hayward.  Tried to work today and unable to finish due to right elbow and left flank pain.  Tried arthritis pain OTC medication without any relief (tylenol )  Having nausea and diarrhea since starting metformin  po BID on Wednesday 5 Oct.  Noticed sweating this am.  No fingerstick machine at home.  Urine seems clearer than earlier in the week but flank pain unchanged.  Intermittent frontal headach resolved thi sam.  Right elbow is throbbing couldn't hold wrenches today.  Ate breakfast this am has been avoiding sweetened tea since diabetes diagnosis.  Has been drinking more water and peeing more often states he has lost a couple pounds since starting metformin.  The history is provided by the patient and the spouse.    Past Medical History  Diagnosis Date  . Diabetes mellitus without complication (HCC)   . Renal calculi    Past Surgical History  Procedure Laterality Date  . Cervical disc surgery  2006   Family History  Problem Relation Age of Onset  . Stroke Father    Social History  Substance Use Topics  . Smoking status: Never Smoker   . Smokeless tobacco: None  . Alcohol Use: No    Review of Systems  Constitutional: Positive for diaphoresis. Negative for fever, chills, activity change, appetite change and fatigue.  HENT: Negative for congestion, dental problem, drooling, ear discharge, ear pain, facial swelling, hearing loss, mouth sores, nosebleeds, postnasal drip, rhinorrhea, sinus pressure, sneezing, sore throat,  tinnitus, trouble swallowing and voice change.   Eyes: Negative for photophobia, pain, discharge, redness, itching and visual disturbance.  Respiratory: Negative for cough, choking, chest tightness, shortness of breath, wheezing and stridor.   Cardiovascular: Negative for chest pain, palpitations and leg swelling.  Gastrointestinal: Positive for nausea and diarrhea. Negative for vomiting, abdominal pain, constipation, blood in stool, abdominal distention and anal bleeding.  Endocrine: Positive for polyuria. Negative for cold intolerance and heat intolerance.  Genitourinary: Positive for flank pain. Negative for urgency, frequency, hematuria, decreased urine volume, discharge, penile swelling, scrotal swelling, genital sores, penile pain and testicular pain.  Musculoskeletal: Positive for arthralgias. Negative for myalgias, back pain, joint swelling, gait problem, neck pain and neck stiffness.  Skin: Negative for color change and pallor.  Allergic/Immunologic: Negative for environmental allergies and food allergies.  Neurological: Positive for headaches. Negative for dizziness, tremors, seizures, syncope, facial asymmetry, speech difficulty, weakness, light-headedness and numbness.  Hematological: Negative for adenopathy. Does not bruise/bleed easily.  Psychiatric/Behavioral: Negative for behavioral problems, confusion, sleep disturbance and agitation.    Allergies  Review of patient's allergies indicates no known allergies.  Home Medications   Prior to Admission medications   Medication Sig Start Date End Date Taking? Authorizing Provider  acetaminophen (TYLENOL) 500 MG tablet Take 2 tablets (1,000 mg total) by mouth every 6 (six) hours as needed for mild pain or moderate pain. 09/08/15   Barbaraann Barthel, NP  HYDROcodone-acetaminophen (NORCO/VICODIN) 5-325 MG tablet Take 1 tablet by mouth every 6 (six) hours as needed for moderate pain or severe pain. 09/08/15  Barbaraann Barthel, NP    lisinopril (PRINIVIL,ZESTRIL) 10 MG tablet Take 1 tablet (10 mg total) by mouth daily. 09/05/15   Schuyler Amor, MD  metFORMIN (GLUCOPHAGE) 500 MG tablet Take 1 tablet (500 mg total) by mouth 2 (two) times daily with a meal. 09/05/15   Schuyler Amor, MD  ondansetron (ZOFRAN) 8 MG tablet Take 1 tablet (8 mg total) by mouth 2 (two) times daily as needed for nausea or vomiting (may substitute generic ODT). 09/08/15   Barbaraann Barthel, NP  tamsulosin (FLOMAX) 0.4 MG CAPS capsule Take 1 capsule (0.4 mg total) by mouth daily. 09/08/15   Barbaraann Barthel, NP   Meds Ordered and Administered this Visit  Medications - No data to display  BP 130/65 mmHg  Pulse 80  Temp(Src) 98.2 F (36.8 C) (Tympanic)  Resp 20  Ht 5\' 11"  (1.803 m)  Wt 223 lb (101.152 kg)  BMI 31.12 kg/m2  SpO2 99% No data found.   Physical Exam  Constitutional: He is oriented to person, place, and time. Vital signs are normal. He appears well-developed and well-nourished. No distress.  HENT:  Head: Normocephalic and atraumatic.  Right Ear: Hearing, tympanic membrane, external ear and ear canal normal.  Left Ear: Hearing, tympanic membrane, external ear and ear canal normal.  Nose: Nose normal. No mucosal edema, rhinorrhea, nose lacerations or sinus tenderness. No epistaxis.  No foreign bodies. Right sinus exhibits no maxillary sinus tenderness and no frontal sinus tenderness. Left sinus exhibits no maxillary sinus tenderness and no frontal sinus tenderness.  Mouth/Throat: Uvula is midline, oropharynx is clear and moist and mucous membranes are normal. Mucous membranes are not pale, not dry and not cyanotic. He does not have dentures. No oral lesions. No trismus in the jaw. Normal dentition. No dental abscesses, uvula swelling, lacerations or dental caries. No oropharyngeal exudate, posterior oropharyngeal edema, posterior oropharyngeal erythema or tonsillar abscesses.  Eyes: Conjunctivae, EOM and lids are normal. Pupils are equal,  round, and reactive to light. Right eye exhibits no chemosis, no discharge, no exudate and no hordeolum. No foreign body present in the right eye. Left eye exhibits no chemosis, no discharge, no exudate and no hordeolum. No foreign body present in the left eye. Right conjunctiva is not injected. Right conjunctiva has no hemorrhage. Left conjunctiva is not injected. Left conjunctiva has no hemorrhage. No scleral icterus. Right eye exhibits normal extraocular motion and no nystagmus. Left eye exhibits normal extraocular motion and no nystagmus. Right pupil is round and reactive. Left pupil is round and reactive. Pupils are equal.  Neck: Normal range of motion. Neck supple. No tracheal tenderness, no spinous process tenderness and no muscular tenderness present. No rigidity. No tracheal deviation, no edema, no erythema and normal range of motion present. No thyromegaly present.  Cardiovascular: Normal rate, regular rhythm, S1 normal, S2 normal, normal heart sounds and intact distal pulses.  Exam reveals no gallop and no friction rub.   No murmur heard. Pulses:      Radial pulses are 2+ on the right side, and 2+ on the left side.  Pulmonary/Chest: Effort normal. No accessory muscle usage or stridor. No respiratory distress. He has no decreased breath sounds. He has no wheezes. He has no rhonchi. He has no rales. He exhibits no tenderness.  Abdominal: Soft. Bowel sounds are normal. He exhibits no shifting dullness, no distension, no pulsatile liver, no fluid wave, no abdominal bruit, no ascites, no pulsatile midline mass and no mass. There is no hepatosplenomegaly. There  is no tenderness. There is no rigidity, no rebound, no guarding, no CVA tenderness, no tenderness at McBurney's point and negative Murphy's sign. Hernia confirmed negative in the ventral area.  Dull to percussion  X 4 quads  Musculoskeletal: Normal range of motion. He exhibits no edema or tenderness.       Right shoulder: Normal.       Left  shoulder: Normal.       Right elbow: He exhibits normal range of motion, no swelling, no effusion, no deformity and no laceration. No tenderness found. No radial head, no medial epicondyle, no lateral epicondyle and no olecranon process tenderness noted.       Left elbow: Normal.       Right wrist: Normal.       Left wrist: Normal.       Right hip: Normal.       Left hip: Normal.       Cervical back: Normal.       Thoracic back: He exhibits pain. He exhibits normal range of motion, no tenderness, no bony tenderness, no swelling, no edema, no deformity, no laceration, no spasm and normal pulse.       Lumbar back: Normal.       Back:       Right upper arm: Normal. He exhibits no tenderness, no bony tenderness, no swelling, no edema, no deformity and no laceration.       Left upper arm: Normal.       Right forearm: He exhibits no tenderness, no bony tenderness, no swelling, no edema, no deformity and no laceration.       Left forearm: Normal.       Arms:      Right hand: Normal.       Left hand: Normal.  Lymphadenopathy:       Head (right side): No submental, no submandibular, no tonsillar, no preauricular, no posterior auricular and no occipital adenopathy present.       Head (left side): No submental, no submandibular, no tonsillar, no preauricular, no posterior auricular and no occipital adenopathy present.    He has no cervical adenopathy.       Right cervical: No superficial cervical, no deep cervical and no posterior cervical adenopathy present.      Left cervical: No superficial cervical, no deep cervical and no posterior cervical adenopathy present.  Neurological: He is alert and oriented to person, place, and time. He displays no atrophy and no tremor. No cranial nerve deficit or sensory deficit. He exhibits normal muscle tone. He displays no seizure activity. Coordination and gait normal. GCS eye subscore is 4. GCS verbal subscore is 5. GCS motor subscore is 6.  Reflex Scores:       Patellar reflexes are 2+ on the right side and 2+ on the left side. Skin: Skin is warm, dry and intact. No abrasion, no bruising, no burn, no ecchymosis, no laceration, no lesion, no petechiae and no rash noted. He is not diaphoretic. No cyanosis or erythema. No pallor. Nails show no clubbing.  Psychiatric: He has a normal mood and affect. His speech is normal and behavior is normal. Judgment and thought content normal. Cognition and memory are normal.  Nursing note and vitals reviewed.   ED Course  Procedures (including critical care time)  Labs Review Labs Reviewed  URINALYSIS COMPLETEWITH MICROSCOPIC ([facility] ONLY) - Abnormal; Notable for the following:    Bacteria, UA NONE SEEN (*)    Squamous Epithelial / LPF NONE  SEEN (*)    All other components within normal limits    Imaging Review Dg Elbow Complete Right  09/08/2015   CLINICAL DATA:  Right elbow pain for 3 weeks without known injury.  EXAM: RIGHT ELBOW - COMPLETE 3+ VIEW  COMPARISON:  None.  FINDINGS: There is no evidence of fracture, dislocation, or joint effusion. There is no evidence of arthropathy or other focal bone abnormality. Soft tissues are unremarkable.  IMPRESSION: Normal right elbow.   Electronically Signed   By: Lupita Raider, M.D.   On: 09/08/2015 14:14   Dg Abd 1 View  09/08/2015   CLINICAL DATA:  Left-sided back pain, 2 weeks duration. History of kidney stones.  EXAM: ABDOMEN - 1 VIEW  COMPARISON:  CT 03/04/2013  FINDINGS: 13 x 20 mm stone previously present in the renal pelvis on the left as enter the ureter and is at the level of L4-5. No other evidence of urinary tract stone disease. No significant bone finding.  IMPRESSION: Previously seen stone in the left renal pelvis has entered the ureter and passed as far as the L4-5 level. This stone measures 13 x 20 mm.   Electronically Signed   By: Paulina Fusi M.D.   On: 09/08/2015 14:06    1340 discussed urinalysis results with patient and spouse; discussed glucose in  urine resolved from 5 Oct and calcium oxalate crystals noted in urine today.  Avoid large portions of meat in diet.  Agreed to proceed with right elbow and KUB xrays. Consider CT and serum labs patient did not want to proceed with them at this time.  Given copy of urinalysis results.  Patient and spouse verbalized understanding of information/instructions, agreed with plan of care and had no further questions at this time.  1435 patient tolerated water po without nausea/vomiting.  Start flomax as soon as receives from pharmacy.  Strainer given to patient by RN.  Strain all urine at home.  Avoid dehydration.  Continue to hydrate.  discussed xray results with patient and discussed how to request images on disk as xray tech currently performing CT scan and unavailable.  Prefers to see urology associated with [facility] in [place], [place].  Referral placed and patient given telephone number.  To ER this weekend at [facility] if worsening symptoms for re-evaluation.  Patient verbalized understanding of information/instructions, agreed with plan of care and had no further questions at this time.   MDM   1. Nephrolithiasis   2. Elbow pain, right   3. Uncontrolled type 2 diabetes mellitus without complication, without long-term current use of insulin (HCC)    Discussed KUB results and urinalysis results with patient and spouse and given copies of reports.  Discussed serum labs today and patient refused at this time.  Ambulatory referral to urology.  Patient to call  Urology apt to be scheduled if no stone passed in next 3 days.  Due to size probably not likely it will pass on own discussed lithotripsy and surgical removal briefly with patient.  Strain all urine.  Hydrate, hydrate.  Start po Flomax 0.4mg  daily as directed.  Has required vicodin and zofran in the past with previous passing of kidney stones Rx given:  norco 5/325 po q6h prn pain #8 RF0 and zofran  ODT po BID prn nausea/vomiting. Patient is also to push  fluids and strain urine Avoid dehydration. Call or return to clinic as needed if these symptoms worsen or fail to improve as anticipated e.g. gross hematuria, fever, worsening pain,  unable to void every 8 hours or decreasing urinary output.  Exitcare handout on nephrolithiasis and dietary choices to prevent kidney stones given to patient.  Patient verbalized agreement and understanding of treatment plan and had no further questions at this time. P2:  Hydrate  Patient was instructed to rest, ice if swelling; heat if worsening pain with cooler weather.  Gentle AROM exitcare handout on elbow exercises, lateral epicondylitis and arthritis given to patient.  Right hand dominant mechanic probable overuse injury.  May continue tylenol 1000mg  po QID.  Xray today negative for arthritis discussed with patient and given copy of report.  Call or return to clinic as needed if these symptoms worsen or fail to improve as anticipated and will consider PT referral and orthopedics evaluation. Discussed with patient if fails 2 week NSAID trial, stretching may require steroid injection by orthopedics.  Discussed arthritis typically responds to heat and gentle AROM lessens pain.  Tendonitis worsens with more activity.  Consider tendonitis strap trial. Patient verbalized agreement and understanding of treatment plan.  P2:  ROM, injury prevention  Discussed to continue metformin as prescribed by Dr Hollace Hayward.  Hydrate.  Take meds with meals if no improvement in GI symptoms next week contact Dr Hollace Hayward and notify him may require adjustment of dosing.  Discussed with patient GI symptoms common with initiation of metformin.  Encouraged to continue exercise routine and wear supportive shoes.  Moisturize skin to keep healthy and prevent sores/breakdown.  Annual routine diabetic eye exam to be scheduled.  Exitcare handout on carbohydrate counting given to patient.   Patient and spouse verbalized understanding of information/instructions/ agreed  with plan of care and had no further questions at this time.    Barbaraann Barthel, NP 09/09/15 1210

## 2015-09-12 ENCOUNTER — Ambulatory Visit: Payer: 59

## 2015-09-17 ENCOUNTER — Telehealth: Payer: Self-pay | Admitting: Radiology

## 2015-09-17 ENCOUNTER — Ambulatory Visit (INDEPENDENT_AMBULATORY_CARE_PROVIDER_SITE_OTHER): Payer: 59 | Admitting: Urology

## 2015-09-17 ENCOUNTER — Encounter: Payer: Self-pay | Admitting: Urology

## 2015-09-17 VITALS — BP 159/79 | HR 85 | Ht 72.0 in | Wt 219.6 lb

## 2015-09-17 DIAGNOSIS — N200 Calculus of kidney: Secondary | ICD-10-CM

## 2015-09-17 LAB — URINALYSIS, COMPLETE
BILIRUBIN UA: NEGATIVE
KETONES UA: NEGATIVE
LEUKOCYTES UA: NEGATIVE
NITRITE UA: NEGATIVE
Protein, UA: NEGATIVE
Urobilinogen, Ur: 0.2 mg/dL (ref 0.2–1.0)
pH, UA: 5 (ref 5.0–7.5)

## 2015-09-17 LAB — MICROSCOPIC EXAMINATION
EPITHELIAL CELLS (NON RENAL): NONE SEEN /HPF (ref 0–10)
Renal Epithel, UA: NONE SEEN /hpf

## 2015-09-17 MED ORDER — CIPROFLOXACIN HCL 500 MG PO TABS: 500.0000 mg | ORAL_TABLET | Freq: Two times a day (BID) | ORAL | Status: DC

## 2015-09-17 NOTE — Progress Notes (Addendum)
09/17/2015 9:18 AM   Brian Butler Apr 19, 1966 [id number]  Referring provider: Schuyler Amor, MD [address] [place], [place] [address]  Chief Complaint  Patient presents with  . Nephrolithiasis    New patient    HPI: The patient is a 49 year old male with a 2 cm left ureteral calculus. The stone dates back to at least 2014 when it was seen on a CT scan as a nonobstructing left renal stone. He has passed stones before, but he does not know what type the stone was. He has never had surgical intervention for a stoma 4. Of note, his father also has stones. Denies any other urological complaints at this time.   PMH: Past Medical History  Diagnosis Date  . Diabetes mellitus without complication (HCC)   . Renal calculi   . Hypertension 09/05/2015  . Nephrolithiasis 09/05/2015  . FH: stroke 09/05/2015  . Hx of hypotestosteronemia 09/05/2015  . Hyperglycemia 09/05/2015  . Uncontrolled type 2 diabetes mellitus without complication (HCC) 09/05/2015    Surgical History: Past Surgical History  Procedure Laterality Date  . Cervical disc surgery  2006    Home Medications:    Medication List       This list is accurate as of: 09/17/15  9:18 AM.  Always use your most recent med list.               acetaminophen 500 MG tablet  Commonly known as:  TYLENOL  Take 2 tablets (1,000 mg total) by mouth every 6 (six) hours as needed for mild pain or moderate pain.     HYDROcodone-acetaminophen 5-325 MG tablet  Commonly known as:  NORCO/VICODIN  Take 1 tablet by mouth every 6 (six) hours as needed for moderate pain or severe pain.     lisinopril 10 MG tablet  Commonly known as:  PRINIVIL,ZESTRIL  Take 1 tablet (10 mg total) by mouth daily.     metFORMIN 500 MG tablet  Commonly known as:  GLUCOPHAGE  Take 1 tablet (500 mg total) by mouth 2 (two) times daily with a meal.     ondansetron 8 MG tablet  Commonly known as:  ZOFRAN  Take 1 tablet (8 mg total) by mouth 2 (two)  times daily as needed for nausea or vomiting (may substitute generic ODT).     tamsulosin 0.4 MG Caps capsule  Commonly known as:  FLOMAX  Take 1 capsule (0.4 mg total) by mouth daily.        Allergies: No Known Allergies  Family History: Family History  Problem Relation Age of Onset  . Stroke Father   . Nephrolithiasis Father   . Nephrolithiasis Father   . Prostate cancer Neg Hx   . Bladder Cancer Neg Hx     Social History:  reports that he has never smoked. He does not have any smokeless tobacco history on file. He reports that he does not drink alcohol or use illicit drugs.  ROS: UROLOGY Frequent Urination?: No Hard to postpone urination?: No Burning/pain with urination?: No Get up at night to urinate?: Yes Leakage of urine?: No Urine stream starts and stops?: No Trouble starting stream?: No Do you have to strain to urinate?: No Blood in urine?: No Urinary tract infection?: No Sexually transmitted disease?: No Injury to kidneys or bladder?: No Painful intercourse?: No Weak stream?: No Erection problems?: No Penile pain?: No  Gastrointestinal Nausea?: No Vomiting?: No Indigestion/heartburn?: No Diarrhea?: No Constipation?: No  Constitutional Fever: No Night sweats?: No  Weight loss?: No Fatigue?: No  Skin Skin rash/lesions?: No Itching?: No  Eyes Blurred vision?: No Double vision?: No  Ears/Nose/Throat Sore throat?: No Sinus problems?: No  Hematologic/Lymphatic Swollen glands?: No Easy bruising?: No  Cardiovascular Leg swelling?: No Chest pain?: No  Respiratory Cough?: No Shortness of breath?: No  Endocrine Excessive thirst?: No  Musculoskeletal Back pain?: No Joint pain?: Yes  Neurological Headaches?: No Dizziness?: No  Psychologic Depression?: No Anxiety?: No  Physical Exam: BP 159/79 mmHg  Pulse 85  Ht 6' (1.829 m)  Wt 219 lb 9.6 oz (99.61 kg)  BMI 29.78 kg/m2  Constitutional:  Alert and oriented, No acute  distress. HEENT: [place] AT, moist mucus membranes.  Trachea midline, no masses. Cardiovascular: No clubbing, cyanosis, or edema. Respiratory: Normal respiratory effort, no increased work of breathing. GI: Abdomen is soft, nontender, nondistended, no abdominal masses GU: No CVA tenderness. Phallus. Testicles descended equally bilaterally. No masses. Nontender to palpation. Skin: No rashes, bruises or suspicious lesions. Lymph: No cervical or inguinal adenopathy. Neurologic: Grossly intact, no focal deficits, moving all 4 extremities. Psychiatric: Normal mood and affect.  Laboratory Data: Lab Results  Component Value Date   WBC 7.0 09/05/2015   HGB 14.9 04/13/2013   HCT 43.4 09/05/2015   MCV 85 04/13/2013   PLT 215 04/13/2013    Lab Results  Component Value Date   CREATININE 1.26 09/05/2015    No results found for: PSA  Lab Results  Component Value Date   TESTOSTERONE 244* 09/05/2015    Lab Results  Component Value Date   HGBA1C 7.7* 09/05/2015    Urinalysis    Component Value Date/Time   COLORURINE YELLOW 09/08/2015 1304   COLORURINE Yellow 04/13/2013 1423   APPEARANCEUR CLEAR 09/08/2015 1304   APPEARANCEUR Hazy 04/13/2013 1423   LABSPEC 1.025 09/08/2015 1304   LABSPEC 1.037 04/13/2013 1423   PHURINE 6.0 09/08/2015 1304   PHURINE 5.0 04/13/2013 1423   GLUCOSEU NEGATIVE 09/08/2015 1304   GLUCOSEU >=500 04/13/2013 1423   HGBUR NEGATIVE 09/08/2015 1304   HGBUR 3+ 04/13/2013 1423   BILIRUBINUR NEGATIVE 09/08/2015 1304   BILIRUBINUR neg 09/05/2015 1139   BILIRUBINUR Negative 04/13/2013 1423   KETONESUR NEGATIVE 09/08/2015 1304   KETONESUR Negative 04/13/2013 1423   PROTEINUR NEGATIVE 09/08/2015 1304   PROTEINUR neg 09/05/2015 1139   PROTEINUR 30 mg/dL 66/44/0347 4259   UROBILINOGEN 0.2 09/05/2015 1139   NITRITE NEGATIVE 09/08/2015 1304   NITRITE neg 09/05/2015 1139   NITRITE Negative 04/13/2013 1423   LEUKOCYTESUR NEGATIVE 09/08/2015 1304   LEUKOCYTESUR  Negative 04/13/2013 1423    Pertinent Imaging:  Study Result     CLINICAL DATA: Left-sided back pain, 2 weeks duration. History of kidney stones.  EXAM: ABDOMEN - 1 VIEW  COMPARISON: CT 03/04/2013  FINDINGS: 13 x 20 mm stone previously present in the renal pelvis on the left as enter the ureter and is at the level of L4-5. No other evidence of urinary tract stone disease. No significant bone finding.  IMPRESSION: Previously seen stone in the left renal pelvis has entered the ureter and passed as far as the L4-5 level. This stone measures 13 x 20 mm.     Assessment & Plan:    I discussed with the patient that the stone will likely not pass on its own given its size. We discussed areas treatment options which include left ureteroscopy with laser lithotripsy and extracorporeal shockwave lithotripsy. We discussed the risks, benefits, and stone free rate possibilities with each treatment. He  has elected to undergo left ESWL. He understands that he may not be stone free after the procedure. He also understands that he still may need ureteroscopy in the future if all stone fragments are unable to be broken in past. All risks, benefits,indications, and alternatives of ESWL were described with the patient. He is also aware that the risks do include, but are not limited to, hematoma and repeat stone surgeries. He is agreeable to proceeding.  1. Left ureteral calculus -Left ESWL -culture urine (Bacteria on U/A).  -start cipro now prior to procedure -f/u culture on day of lithotripsy  Return for ESWL with follow up 1-2 weeks after with KUB prior.  Hildred LaserBrian James Tiani Stanbery, MD  [facility] [address], [address] [place], [place] [address] [phone]

## 2015-09-17 NOTE — Addendum Note (Signed)
Addended by: Catalina LungerBUDZYN, Caidon Foti J on: 09/17/2015 09:35 AM   Modules accepted: Orders

## 2015-09-17 NOTE — Addendum Note (Signed)
Addended by: Martha ClanWATTS, SARAH M on: 09/17/2015 09:31 AM   Modules accepted: Orders

## 2015-09-17 NOTE — Telephone Encounter (Signed)
Went over paperwork for ESWL with pt during office visit. Pt advised that surgery is scheduled 09/20/15 with arrival time of 1:30 to SDS, he should be npo after mn day of surgery and instructed to use laxatives on the day prior to procedure. Pt verbalizes understanding.

## 2015-09-19 ENCOUNTER — Ambulatory Visit: Payer: 59 | Admitting: Family Medicine

## 2015-09-20 ENCOUNTER — Encounter: Payer: Self-pay | Admitting: *Deleted

## 2015-09-20 ENCOUNTER — Ambulatory Visit
Admission: RE | Admit: 2015-09-20 | Discharge: 2015-09-20 | Disposition: A | Discharge status: Home / Self Care | Payer: 59 | Source: Ambulatory Visit | Attending: Urology | Admitting: Urology

## 2015-09-20 ENCOUNTER — Ambulatory Visit: Payer: 59

## 2015-09-20 ENCOUNTER — Encounter
Admission: RE | Disposition: A | Discharge status: Home / Self Care | Payer: Self-pay | Source: Ambulatory Visit | Attending: Urology

## 2015-09-20 DIAGNOSIS — I10 Essential (primary) hypertension: Secondary | ICD-10-CM | POA: Insufficient documentation

## 2015-09-20 DIAGNOSIS — E119 Type 2 diabetes mellitus without complications: Secondary | ICD-10-CM | POA: Diagnosis not present

## 2015-09-20 DIAGNOSIS — Z841 Family history of disorders of kidney and ureter: Secondary | ICD-10-CM | POA: Diagnosis not present

## 2015-09-20 DIAGNOSIS — N201 Calculus of ureter: Secondary | ICD-10-CM | POA: Insufficient documentation

## 2015-09-20 DIAGNOSIS — N200 Calculus of kidney: Secondary | ICD-10-CM

## 2015-09-20 HISTORY — PX: EXTRACORPOREAL SHOCK WAVE LITHOTRIPSY: SHX1557

## 2015-09-20 LAB — CULTURE, URINE COMPREHENSIVE

## 2015-09-20 LAB — GLUCOSE, CAPILLARY: GLUCOSE-CAPILLARY: 101 mg/dL — AB (ref 65–99)

## 2015-09-20 SURGERY — LITHOTRIPSY, ESWL
Anesthesia: Moderate Sedation | Laterality: Left

## 2015-09-20 MED ORDER — DIPHENHYDRAMINE HCL 25 MG PO CAPS
25.0000 mg | ORAL_CAPSULE | ORAL | Status: AC
  Administered 2015-09-20: 25 mg via ORAL

## 2015-09-20 MED ORDER — DIPHENHYDRAMINE HCL 25 MG PO CAPS
ORAL_CAPSULE | ORAL | Status: AC
  Filled 2015-09-20: qty 1

## 2015-09-20 MED ORDER — DEXTROSE-NACL 5-0.45 % IV SOLN
INTRAVENOUS | Status: DC
  Administered 2015-09-20: 14:00:00 via INTRAVENOUS

## 2015-09-20 MED ORDER — CIPROFLOXACIN HCL 500 MG PO TABS
ORAL_TABLET | ORAL | Status: AC
  Filled 2015-09-20: qty 1

## 2015-09-20 MED ORDER — DIAZEPAM 5 MG PO TABS
10.0000 mg | ORAL_TABLET | ORAL | Status: AC
  Administered 2015-09-20: 10 mg via ORAL

## 2015-09-20 MED ORDER — DIAZEPAM 5 MG PO TABS
ORAL_TABLET | ORAL | Status: AC
  Filled 2015-09-20: qty 2

## 2015-09-20 MED ORDER — PROMETHAZINE HCL 25 MG/ML IJ SOLN
25.0000 mg | Freq: Once | INTRAMUSCULAR | Status: AC
  Administered 2015-09-20: 25 mg via INTRAMUSCULAR

## 2015-09-20 MED ORDER — CIPROFLOXACIN HCL 500 MG PO TABS
500.0000 mg | ORAL_TABLET | ORAL | Status: AC
  Administered 2015-09-20: 500 mg via ORAL

## 2015-09-20 MED ORDER — PROMETHAZINE HCL 25 MG/ML IJ SOLN
INTRAMUSCULAR | Status: AC
  Administered 2015-09-20: 25 mg via INTRAMUSCULAR
  Filled 2015-09-20: qty 1

## 2015-09-20 NOTE — Discharge Instructions (Signed)

## 2015-09-20 NOTE — OR Nursing (Signed)
Patient discharged 2100 pm after eating crackers and taking soda.  Nausea resolved.  Went over discharge instructions with his wife.  Percocet prescription given to patient/family

## 2015-10-05 ENCOUNTER — Ambulatory Visit
Admission: RE | Admit: 2015-10-05 | Discharge: 2015-10-05 | Disposition: A | Discharge status: Home / Self Care | Payer: 59 | Source: Ambulatory Visit | Attending: Urology | Admitting: Urology

## 2015-10-05 ENCOUNTER — Encounter: Payer: Self-pay | Admitting: Urology

## 2015-10-05 ENCOUNTER — Ambulatory Visit (INDEPENDENT_AMBULATORY_CARE_PROVIDER_SITE_OTHER): Payer: 59 | Admitting: Urology

## 2015-10-05 VITALS — BP 152/75 | HR 80 | Ht 72.0 in | Wt 217.0 lb

## 2015-10-05 DIAGNOSIS — N201 Calculus of ureter: Secondary | ICD-10-CM

## 2015-10-05 DIAGNOSIS — N200 Calculus of kidney: Secondary | ICD-10-CM

## 2015-10-05 LAB — URINALYSIS, COMPLETE
BILIRUBIN UA: NEGATIVE
Glucose, UA: NEGATIVE
KETONES UA: NEGATIVE
Leukocytes, UA: NEGATIVE
NITRITE UA: NEGATIVE
Protein, UA: NEGATIVE
Specific Gravity, UA: >1.03 — ABNORMAL HIGH (ref 1.005–1.030)
Urobilinogen, Ur: 0.2 mg/dL (ref 0.2–1.0)
pH, UA: 5 (ref 5.0–7.5)

## 2015-10-05 LAB — MICROSCOPIC EXAMINATION
Bacteria, UA: NONE SEEN
EPITHELIAL CELLS (NON RENAL): NONE SEEN /HPF (ref 0–10)
RBC MICROSCOPIC, UA: NONE SEEN /HPF (ref 0–?)
Renal Epithel, UA: NONE SEEN /hpf
WBC, UA: NONE SEEN /hpf (ref 0–?)

## 2015-10-05 MED ORDER — TAMSULOSIN HCL 0.4 MG PO CAPS: 0.4000 mg | ORAL_CAPSULE | Freq: Every day | ORAL | Status: DC

## 2015-10-05 NOTE — Progress Notes (Signed)
10/05/2015 2:44 PM   Brian Butler [birth year] [id number]  Referring provider: Schuyler Amor, MD [address] [place], [place] [address]  Chief Complaint  Patient presents with  . Post Litho    Recheck    HPI: The patient is a 49 year old male follows up after ESWL on is to send her left ureteral stone. He is then well since the procedure. He notes some mild discomfort in his flank. No hematuria. He has passed stone fragments.     PMH: Past Medical History  Diagnosis Date  . Diabetes mellitus without complication (HCC)   . Renal calculi   . Hypertension 09/05/2015  . Nephrolithiasis 09/05/2015  . FH: stroke 09/05/2015  . Hx of hypotestosteronemia 09/05/2015  . Hyperglycemia 09/05/2015  . Uncontrolled type 2 diabetes mellitus without complication (HCC) 09/05/2015    Surgical History: Past Surgical History  Procedure Laterality Date  . Cervical disc surgery  2006  . Extracorporeal shock wave lithotripsy Left 09/20/2015    Procedure: EXTRACORPOREAL SHOCK WAVE LITHOTRIPSY (ESWL);  Surgeon: Hildred Laser, MD;  Location: [facility] ORS;  Service: Urology;  Laterality: Left;    Home Medications:    Medication List       This list is accurate as of: 10/05/15  2:44 PM.  Always use your most recent med list.               acetaminophen 500 MG tablet  Commonly known as:  TYLENOL  Take 2 tablets (1,000 mg total) by mouth every 6 (six) hours as needed for mild pain or moderate pain.     ciprofloxacin 500 MG tablet  Commonly known as:  CIPRO  Take 1 tablet (500 mg total) by mouth every 12 (twelve) hours.     HYDROcodone-acetaminophen 5-325 MG tablet  Commonly known as:  NORCO/VICODIN  Take 1 tablet by mouth every 6 (six) hours as needed for moderate pain or severe pain.     lisinopril 10 MG tablet  Commonly known as:  PRINIVIL,ZESTRIL  Take 1 tablet (10 mg total) by mouth daily.     metFORMIN 500 MG tablet  Commonly known as:  GLUCOPHAGE  Take 1 tablet (500  mg total) by mouth 2 (two) times daily with a meal.     ondansetron 8 MG tablet  Commonly known as:  ZOFRAN  Take 1 tablet (8 mg total) by mouth 2 (two) times daily as needed for nausea or vomiting (may substitute generic ODT).     tamsulosin 0.4 MG Caps capsule  Commonly known as:  FLOMAX  Take 1 capsule (0.4 mg total) by mouth daily.     tamsulosin 0.4 MG Caps capsule  Commonly known as:  FLOMAX  Take 1 capsule (0.4 mg total) by mouth daily.        Allergies: No Known Allergies  Family History: Family History  Problem Relation Age of Onset  . Stroke Father   . Nephrolithiasis Father   . Nephrolithiasis Father   . Prostate cancer Neg Hx   . Bladder Cancer Neg Hx     Social History:  reports that he has never smoked. He does not have any smokeless tobacco history on file. He reports that he does not drink alcohol or use illicit drugs.  ROS: UROLOGY Frequent Urination?: Yes Hard to postpone urination?: No Burning/pain with urination?: No Get up at night to urinate?: Yes Leakage of urine?: No Urine stream starts and stops?: No Trouble starting stream?: No Do you have to strain  to urinate?: No Blood in urine?: No Urinary tract infection?: No Sexually transmitted disease?: No Injury to kidneys or bladder?: No Painful intercourse?: No Weak stream?: No Erection problems?: No Penile pain?: No  Gastrointestinal Nausea?: No Vomiting?: No Indigestion/heartburn?: No Diarrhea?: No Constipation?: No  Constitutional Fever: No Night sweats?: No Weight loss?: Yes Fatigue?: Yes  Skin Skin rash/lesions?: No Itching?: No  Eyes Blurred vision?: No Double vision?: No  Ears/Nose/Throat Sore throat?: No Sinus problems?: No  Hematologic/Lymphatic Swollen glands?: No Easy bruising?: No  Cardiovascular Leg swelling?: No Chest pain?: No  Respiratory Cough?: No Shortness of breath?: No  Endocrine Excessive thirst?: No  Musculoskeletal Back pain?:  No Joint pain?: No  Neurological Headaches?: No Dizziness?: No  Psychologic Depression?: No Anxiety?: No  Physical Exam: BP 152/75 mmHg  Pulse 80  Ht 6' (1.829 m)  Wt 217 lb (98.431 kg)  BMI 29.42 kg/m2  Constitutional:  Alert and oriented, No acute distress. HEENT: [place] AT, moist mucus membranes.  Trachea midline, no masses. Cardiovascular: No clubbing, cyanosis, or edema. Respiratory: Normal respiratory effort, no increased work of breathing. GI: Abdomen is soft, nontender, nondistended, no abdominal masses GU: No CVA tenderness.  Skin: No rashes, bruises or suspicious lesions. Lymph: No cervical or inguinal adenopathy. Neurologic: Grossly intact, no focal deficits, moving all 4 extremities. Psychiatric: Normal mood and affect.  Laboratory Data: Lab Results  Component Value Date   WBC 7.0 09/05/2015   HGB 14.9 04/13/2013   HCT 43.4 09/05/2015   MCV 85 04/13/2013   PLT 215 04/13/2013    Lab Results  Component Value Date   CREATININE 1.26 09/05/2015    No results found for: PSA  Lab Results  Component Value Date   TESTOSTERONE 244* 09/05/2015    Lab Results  Component Value Date   HGBA1C 7.7* 09/05/2015    Urinalysis    Component Value Date/Time   COLORURINE YELLOW 09/08/2015 1304   COLORURINE Yellow 04/13/2013 1423   APPEARANCEUR CLEAR 09/08/2015 1304   APPEARANCEUR Hazy 04/13/2013 1423   LABSPEC 1.025 09/08/2015 1304   LABSPEC 1.037 04/13/2013 1423   PHURINE 6.0 09/08/2015 1304   PHURINE 5.0 04/13/2013 1423   GLUCOSEU 1+* 09/17/2015 0851   GLUCOSEU >=500 04/13/2013 1423   HGBUR NEGATIVE 09/08/2015 1304   HGBUR 3+ 04/13/2013 1423   BILIRUBINUR Negative 09/17/2015 0851   BILIRUBINUR NEGATIVE 09/08/2015 1304   BILIRUBINUR neg 09/05/2015 1139   BILIRUBINUR Negative 04/13/2013 1423   KETONESUR NEGATIVE 09/08/2015 1304   KETONESUR Negative 04/13/2013 1423   PROTEINUR NEGATIVE 09/08/2015 1304   PROTEINUR neg 09/05/2015 1139   PROTEINUR 30 mg/dL  81/19/147805/14/2014 [id number]   UROBILINOGEN 0.2 09/05/2015 1139   NITRITE Negative 09/17/2015 0851   NITRITE NEGATIVE 09/08/2015 1304   NITRITE neg 09/05/2015 1139   NITRITE Negative 04/13/2013 1423   LEUKOCYTESUR Negative 09/17/2015 0851   LEUKOCYTESUR NEGATIVE 09/08/2015 1304   LEUKOCYTESUR Negative 04/13/2013 1423    Pertinent Imaging:  KUB shows residual stone from his distal left ureter. It appears that there are no large stone pieces but rather multiple fragments.  Assessment & Plan:   1. Left Ureteral stone  The patient has passed some stone fragments after ESWL, however he still has residual fragments on his KUB. He was given prescription for Flomax. We'll get a KUB and renal ultrasound in 1 month to ensure that this continues to resolve itself. - Abdomen 1 view (KUB); Future - Ultrasound renal complete; Future   Return in about 4 weeks (around 11/02/2015)  for with KUB, ultrasound prior.  Hildred Laser, MD  [facility] [address] [place], [place] [address] [phone]

## 2015-10-08 ENCOUNTER — Ambulatory Visit (INDEPENDENT_AMBULATORY_CARE_PROVIDER_SITE_OTHER): Payer: 59 | Admitting: Podiatry

## 2015-10-08 ENCOUNTER — Encounter: Payer: Self-pay | Admitting: Podiatry

## 2015-10-08 VITALS — BP 140/81 | HR 67 | Resp 16

## 2015-10-08 DIAGNOSIS — M722 Plantar fascial fibromatosis: Secondary | ICD-10-CM | POA: Diagnosis not present

## 2015-10-08 MED ORDER — TRIAMCINOLONE ACETONIDE 10 MG/ML IJ SUSP
10.0000 mg | Freq: Once | INTRAMUSCULAR | Status: AC
  Administered 2015-10-08: 10 mg

## 2015-10-08 MED ORDER — DICLOFENAC SODIUM 75 MG PO TBEC: 75.0000 mg | DELAYED_RELEASE_TABLET | Freq: Two times a day (BID) | ORAL | Status: DC

## 2015-10-09 ENCOUNTER — Other Ambulatory Visit: Payer: Self-pay

## 2015-10-09 DIAGNOSIS — N200 Calculus of kidney: Secondary | ICD-10-CM

## 2015-10-10 NOTE — Progress Notes (Signed)
Subjective:     Patient ID: Brian SlipperLarry J Butler, male   DOB: [birth year], 49 y.o.   MRN: [id number]  HPI patient states that my left heel is killing me and it started again a few weeks ago and I'm not sure what happened but I have been trying to increase my activity level   Review of Systems     Objective:   Physical Exam Neurovascular status intact muscle strength adequate with patient noted to have intense discomfort plantar fascial band left at the insertion of the tendon into the calcaneus with flatfoot deformity noted    Assessment:     Plantar fasciitis left with mechanical dysfunction of the foot with pain that is worse after periods of sitting and when getting up in the morning    Plan:     H&P condition reviewed. Injected the plantar fascia 3 mg Kenalog 5 mg Xylocaine and dispensed night splint with instructions on usage along with ice packs. Advised on supportive shoes and did scanned today for a customized orthotic device

## 2015-10-31 ENCOUNTER — Ambulatory Visit: Payer: 59

## 2015-11-02 ENCOUNTER — Ambulatory Visit: Payer: 59 | Admitting: *Deleted

## 2015-11-02 DIAGNOSIS — M722 Plantar fascial fibromatosis: Secondary | ICD-10-CM

## 2015-11-02 NOTE — Progress Notes (Signed)
Patient ID: Brian Butler, male   DOB: 06-17-1966, 49 y.o.   MRN: [id number] Patient presents for orthotic pick up.  Verbal and written break in and wear instructions given.  Patient will follow up in 4 weeks if symptoms worsen or fail to improve.

## 2015-11-02 NOTE — Patient Instructions (Signed)

## 2015-11-07 ENCOUNTER — Ambulatory Visit
Admission: RE | Admit: 2015-11-07 | Discharge: 2015-11-07 | Disposition: A | Discharge status: Home / Self Care | Payer: 59 | Source: Ambulatory Visit | Attending: Urology | Admitting: Urology

## 2015-11-07 DIAGNOSIS — N200 Calculus of kidney: Secondary | ICD-10-CM

## 2015-11-07 DIAGNOSIS — N201 Calculus of ureter: Secondary | ICD-10-CM | POA: Insufficient documentation

## 2015-11-07 DIAGNOSIS — Z87442 Personal history of urinary calculi: Secondary | ICD-10-CM | POA: Diagnosis not present

## 2015-11-07 DIAGNOSIS — N132 Hydronephrosis with renal and ureteral calculous obstruction: Secondary | ICD-10-CM | POA: Insufficient documentation

## 2015-11-09 ENCOUNTER — Encounter: Payer: Self-pay | Admitting: Obstetrics and Gynecology

## 2015-11-09 ENCOUNTER — Ambulatory Visit (INDEPENDENT_AMBULATORY_CARE_PROVIDER_SITE_OTHER): Payer: 59 | Admitting: Obstetrics and Gynecology

## 2015-11-09 ENCOUNTER — Ambulatory Visit
Admission: RE | Admit: 2015-11-09 | Discharge: 2015-11-09 | Disposition: A | Discharge status: Home / Self Care | Payer: 59 | Source: Ambulatory Visit | Attending: Urology | Admitting: Urology

## 2015-11-09 ENCOUNTER — Ambulatory Visit: Payer: 59

## 2015-11-09 VITALS — BP 118/76 | HR 72 | Resp 16 | Ht 72.0 in | Wt 216.3 lb

## 2015-11-09 DIAGNOSIS — N200 Calculus of kidney: Secondary | ICD-10-CM | POA: Diagnosis not present

## 2015-11-09 DIAGNOSIS — N201 Calculus of ureter: Secondary | ICD-10-CM | POA: Diagnosis present

## 2015-11-09 LAB — URINALYSIS, COMPLETE
BILIRUBIN UA: NEGATIVE
GLUCOSE, UA: NEGATIVE
KETONES UA: NEGATIVE
LEUKOCYTES UA: NEGATIVE
NITRITE UA: NEGATIVE
Protein, UA: NEGATIVE
Urobilinogen, Ur: 0.2 mg/dL (ref 0.2–1.0)
pH, UA: 5 (ref 5.0–7.5)

## 2015-11-09 LAB — MICROSCOPIC EXAMINATION: EPITHELIAL CELLS (NON RENAL): NONE SEEN /HPF (ref 0–10)

## 2015-11-09 NOTE — Progress Notes (Signed)
11/09/2015 10:14 AM   Brian Butler Jul 03, 1966 [id number]  Referring provider: Schuyler AmorWilliam Plonk, MD [address] [place], [place] [address]  Chief Complaint  Patient presents with  . Nephrolithiasis  . Results    KUB & RUS    HPI: Patient is a 49 year old male with a history of kidney stones presenting today status post ESWL for a 2 cm left ureteral calculus on 09/20/15. Follow-up KUB demonstrated remaining strip stone fragments in the left distal ureter. He presents today to review his KUB and renal ultrasound results. He reports that he has passed a few small stone fragments since his last visit that he has continued to experience left flank pain as well as new onset of left lower quadrant/suprapubic pain. He denies any gross hematuria or fevers. No nausea or vomiting.  Renal ultrasound demonstrating significant left-sided hydronephrosis with distal left ureteral stone.     PMH: Past Medical History  Diagnosis Date  . Diabetes mellitus without complication (HCC)   . Renal calculi   . Hypertension 09/05/2015  . Nephrolithiasis 09/05/2015  . FH: stroke 09/05/2015  . Hx of hypotestosteronemia 09/05/2015  . Hyperglycemia 09/05/2015  . Uncontrolled type 2 diabetes mellitus without complication (HCC) 09/05/2015    Surgical History: Past Surgical History  Procedure Laterality Date  . Cervical disc surgery  2006  . Extracorporeal shock wave lithotripsy Left 09/20/2015    Procedure: EXTRACORPOREAL SHOCK WAVE LITHOTRIPSY (ESWL);  Surgeon: Hildred LaserBrian James Budzyn, MD;  Location: [facility] ORS;  Service: Urology;  Laterality: Left;    Home Medications:    Medication List       This list is accurate as of: 11/09/15 11:59 PM.  Always use your most recent med list.               acetaminophen 500 MG tablet  Commonly known as:  TYLENOL  Take 2 tablets (1,000 mg total) by mouth every 6 (six) hours as needed for mild pain or moderate pain.     lisinopril 10 MG tablet  Commonly known  as:  PRINIVIL,ZESTRIL  Take 1 tablet (10 mg total) by mouth daily.     metFORMIN 500 MG tablet  Commonly known as:  GLUCOPHAGE  Take 1 tablet (500 mg total) by mouth 2 (two) times daily with a meal.     tamsulosin 0.4 MG Caps capsule  Commonly known as:  FLOMAX  Take 1 capsule (0.4 mg total) by mouth daily.        Allergies: No Known Allergies  Family History: Family History  Problem Relation Age of Onset  . Stroke Father   . Nephrolithiasis Father   . Nephrolithiasis Father   . Prostate cancer Neg Hx   . Bladder Cancer Neg Hx     Social History:  reports that he has never smoked. He does not have any smokeless tobacco history on file. He reports that he does not drink alcohol or use illicit drugs.  ROS: UROLOGY Frequent Urination?: No Hard to postpone urination?: No Burning/pain with urination?: No Get up at night to urinate?: No Leakage of urine?: No Urine stream starts and stops?: No Trouble starting stream?: No Do you have to strain to urinate?: No Blood in urine?: No Urinary tract infection?: No Sexually transmitted disease?: No Injury to kidneys or bladder?: No Painful intercourse?: No Weak stream?: No Erection problems?: No Penile pain?: No  Gastrointestinal Nausea?: No Vomiting?: No Indigestion/heartburn?: No Diarrhea?: No Constipation?: No  Constitutional Fever: No Night sweats?: No Weight loss?:  No Fatigue?: No  Skin Skin rash/lesions?: No Itching?: No  Eyes Blurred vision?: No Double vision?: No  Ears/Nose/Throat Sore throat?: No Sinus problems?: No  Hematologic/Lymphatic Swollen glands?: No Easy bruising?: No  Cardiovascular Leg swelling?: No Chest pain?: No  Respiratory Cough?: No Shortness of breath?: No  Endocrine Excessive thirst?: No  Musculoskeletal Back pain?: Yes Joint pain?: No  Neurological Headaches?: No Dizziness?: No  Psychologic Depression?: No Anxiety?: No  Physical Exam: BP 118/76 mmHg   Pulse 72  Resp 16  Ht 6' (1.829 m)  Wt 216 lb 4.8 oz (98.113 kg)  BMI 29.33 kg/m2  Constitutional:  Alert and oriented, No acute distress. HEENT: [place] AT, moist mucus membranes.  Trachea midline, no masses. Cardiovascular: No clubbing, cyanosis, or edema, RRR Respiratory: Normal respiratory effort, no increased work of breathing, CTAB GI: Abdomen is soft, nontender, nondistended, no abdominal masses GU: Left CVA tenderness.  Skin: No rashes, bruises or suspicious lesions. Neurologic: Grossly intact, no focal deficits, moving all 4 extremities. Psychiatric: Normal mood and affect.  Laboratory Data:   Urinalysis Results for orders placed or performed in visit on 11/09/15  CULTURE, URINE COMPREHENSIVE  Result Value Ref Range   Urine Culture, Comprehensive Final report    Result 1 Comment   Microscopic Examination  Result Value Ref Range   WBC, UA 0-5 0 -  5 /hpf   RBC, UA 3-10 (A) 0 -  2 /hpf   Epithelial Cells (non renal) None seen 0 - 10 /hpf   Bacteria, UA Few (A) None seen/Few  Urinalysis, Complete  Result Value Ref Range   Specific Gravity, UA >1.030 (H) 1.005 - 1.030   pH, UA 5.0 5.0 - 7.5   Color, UA Yellow Yellow   Appearance Ur Clear Clear   Leukocytes, UA Negative Negative   Protein, UA Negative Negative/Trace   Glucose, UA Negative Negative   Ketones, UA Negative Negative   RBC, UA 2+ (A) Negative   Bilirubin, UA Negative Negative   Urobilinogen, Ur 0.2 0.2 - 1.0 mg/dL   Nitrite, UA Negative Negative   Microscopic Examination See below:     Pertinent Imaging: CLINICAL DATA: History of kidney stones  EXAM: RENAL / URINARY TRACT ULTRASOUND COMPLETE  COMPARISON: 10/05/2015  FINDINGS: Right Kidney:  Length: 11.4 cm. Echogenicity within normal limits. No mass or hydronephrosis visualized.  Left Kidney:  Length: 13.4 cm. Severe hydronephrosis is noted. Proximal hydroureter is seen. This would correspond with the previously noted mid and distal  left ureteral stones. A 6 mm calcification is noted of the distal left ureter consistent with that seen on prior plain film examination.  Bladder:  Bilateral ureteral jets are noted. Mild distension is seen.  IMPRESSION: Significant left-sided hydronephrosis with distal left ureteral stone.   Electronically Signed  By: Alcide Clever M.D.  On: 11/07/2015 09:52        Assessment & Plan:   1. Kidney Stone- KUB and renal ultrasound demonstrating remaining left ureteral distal stone fragment with left-sided hydronephrosis. Patient in continuing to experience discomfort. We discussed various treatment options including ESWL vs. ureteroscopy, laser lithotripsy, and stent. We discussed the risks and benefits of both including bleeding, infection, damage to surrounding structures, efficacy with need for possible further intervention, and need for temporary ureteral stent. Patient adamantly refused URS and would like to proceed once again with ESWL. -Urinalysis -Urine Culture  There are no diagnoses linked to this encounter.  Return for schedule ESWL.  These notes generated with voice recognition software.  I apologize for typographical errors.  Herbert Moors, [facility] [address], [place], [place] [address] [phone]

## 2015-11-11 LAB — CULTURE, URINE COMPREHENSIVE

## 2015-11-13 ENCOUNTER — Telehealth: Payer: Self-pay | Admitting: Radiology

## 2015-11-13 NOTE — Telephone Encounter (Signed)
Pt notified arrival time for ESWL on 11/15/15 changed to 10:15 & he should be npo after mn. Pt voices understanding. Pt states he was previously given Phenergan IM following lithotripsy & he does not want that given again d/t pain.

## 2015-11-14 ENCOUNTER — Encounter: Payer: Self-pay | Admitting: *Deleted

## 2015-11-15 ENCOUNTER — Ambulatory Visit: Payer: 59

## 2015-11-15 ENCOUNTER — Encounter
Admission: RE | Disposition: A | Discharge status: Home / Self Care | Payer: Self-pay | Source: Ambulatory Visit | Attending: Urology

## 2015-11-15 ENCOUNTER — Encounter: Payer: Self-pay | Admitting: *Deleted

## 2015-11-15 ENCOUNTER — Ambulatory Visit
Admission: RE | Admit: 2015-11-15 | Discharge: 2015-11-15 | Disposition: A | Discharge status: Home / Self Care | Payer: 59 | Source: Ambulatory Visit | Attending: Urology | Admitting: Urology

## 2015-11-15 DIAGNOSIS — N200 Calculus of kidney: Secondary | ICD-10-CM

## 2015-11-15 DIAGNOSIS — N201 Calculus of ureter: Secondary | ICD-10-CM | POA: Insufficient documentation

## 2015-11-15 DIAGNOSIS — E119 Type 2 diabetes mellitus without complications: Secondary | ICD-10-CM | POA: Insufficient documentation

## 2015-11-15 DIAGNOSIS — I10 Essential (primary) hypertension: Secondary | ICD-10-CM | POA: Insufficient documentation

## 2015-11-15 HISTORY — PX: EXTRACORPOREAL SHOCK WAVE LITHOTRIPSY: SHX1557

## 2015-11-15 SURGERY — LITHOTRIPSY, ESWL
Anesthesia: Moderate Sedation | Laterality: Left

## 2015-11-15 MED ORDER — CIPROFLOXACIN HCL 500 MG PO TABS
500.0000 mg | ORAL_TABLET | ORAL | Status: AC
  Administered 2015-11-15: 500 mg via ORAL

## 2015-11-15 MED ORDER — DIPHENHYDRAMINE HCL 25 MG PO CAPS
25.0000 mg | ORAL_CAPSULE | ORAL | Status: AC
  Administered 2015-11-15: 25 mg via ORAL

## 2015-11-15 MED ORDER — DIAZEPAM 5 MG PO TABS
ORAL_TABLET | ORAL | Status: AC
  Filled 2015-11-15: qty 2

## 2015-11-15 MED ORDER — CIPROFLOXACIN HCL 500 MG PO TABS
ORAL_TABLET | ORAL | Status: AC
  Filled 2015-11-15: qty 1

## 2015-11-15 MED ORDER — DIPHENHYDRAMINE HCL 25 MG PO CAPS
ORAL_CAPSULE | ORAL | Status: AC
  Filled 2015-11-15: qty 1

## 2015-11-15 MED ORDER — DIAZEPAM 5 MG PO TABS
10.0000 mg | ORAL_TABLET | ORAL | Status: AC
  Administered 2015-11-15: 10 mg via ORAL

## 2015-11-15 MED ORDER — DEXTROSE-NACL 5-0.45 % IV SOLN: INTRAVENOUS | Status: DC

## 2015-11-15 NOTE — Discharge Instructions (Signed)
See [facility] discharge instructions in chart.  

## 2015-11-15 NOTE — Progress Notes (Signed)
Call made to Dr. Apolinar JunesBrandon, have a consent for patient and procedure but it nor the history and physical are Dated or times.  She will correct on arrival.

## 2015-11-15 NOTE — H&P (Signed)
See paper H&P 

## 2015-11-20 ENCOUNTER — Telehealth: Payer: Self-pay | Admitting: Urology

## 2015-11-20 MED ORDER — HYDROCODONE-ACETAMINOPHEN 5-325 MG PO TABS: 1.0000 | ORAL_TABLET | Freq: Four times a day (QID) | ORAL | Status: DC | PRN

## 2015-11-20 NOTE — Telephone Encounter (Signed)
Pt called stating that he is out of his pain med.  Still passing fragments.  Needs more pain medicine.  Please call [phone].  Wife is a Engineer, civil (consulting)nurse and pt states that it's ok for her to come by to pick up prescription.

## 2015-11-20 NOTE — Telephone Encounter (Signed)
Please advise 

## 2015-11-20 NOTE — Telephone Encounter (Signed)
Pt wife will be by to pick up script.

## 2015-11-20 NOTE — Telephone Encounter (Signed)
Script signed for #15.  He should transition to Ibuprofen/ tylenol.    Vanna ScotlandAshley Kaydee Magel, MD

## 2015-11-22 ENCOUNTER — Other Ambulatory Visit: Payer: Self-pay

## 2015-11-22 MED ORDER — METFORMIN HCL 500 MG PO TABS: 500.0000 mg | ORAL_TABLET | Freq: Two times a day (BID) | ORAL | Status: DC

## 2015-11-30 ENCOUNTER — Telehealth: Payer: Self-pay | Admitting: Radiology

## 2015-11-30 ENCOUNTER — Ambulatory Visit
Admission: RE | Admit: 2015-11-30 | Discharge: 2015-11-30 | Disposition: A | Discharge status: Home / Self Care | Payer: 59 | Source: Ambulatory Visit | Attending: Urology | Admitting: Urology

## 2015-11-30 ENCOUNTER — Ambulatory Visit (INDEPENDENT_AMBULATORY_CARE_PROVIDER_SITE_OTHER): Payer: 59 | Admitting: Urology

## 2015-11-30 ENCOUNTER — Encounter: Payer: Self-pay | Admitting: Urology

## 2015-11-30 VITALS — BP 138/79 | HR 81 | Ht 72.0 in | Wt 217.4 lb

## 2015-11-30 DIAGNOSIS — N201 Calculus of ureter: Secondary | ICD-10-CM | POA: Diagnosis not present

## 2015-11-30 DIAGNOSIS — N202 Calculus of kidney with calculus of ureter: Secondary | ICD-10-CM | POA: Diagnosis not present

## 2015-11-30 DIAGNOSIS — N200 Calculus of kidney: Secondary | ICD-10-CM | POA: Diagnosis present

## 2015-11-30 DIAGNOSIS — R109 Unspecified abdominal pain: Secondary | ICD-10-CM

## 2015-11-30 DIAGNOSIS — N132 Hydronephrosis with renal and ureteral calculous obstruction: Secondary | ICD-10-CM | POA: Diagnosis not present

## 2015-11-30 LAB — MICROSCOPIC EXAMINATION: EPITHELIAL CELLS (NON RENAL): NONE SEEN /HPF (ref 0–10)

## 2015-11-30 LAB — URINALYSIS, COMPLETE
BILIRUBIN UA: NEGATIVE
Ketones, UA: NEGATIVE
LEUKOCYTES UA: NEGATIVE
Nitrite, UA: NEGATIVE
Urobilinogen, Ur: 0.2 mg/dL (ref 0.2–1.0)
pH, UA: 5 (ref 5.0–7.5)

## 2015-11-30 MED ORDER — OXYCODONE-ACETAMINOPHEN 5-325 MG PO TABS: 1.0000 | ORAL_TABLET | ORAL | Status: DC | PRN

## 2015-11-30 NOTE — Telephone Encounter (Signed)
Notified pt of surgery scheduled 12/05/15, pre-admit testing appt on 12/04/15 and to call day prior to surgery for arrival time to SDS. Pt voices understanding.

## 2015-11-30 NOTE — Progress Notes (Signed)
3:35 PM  11/30/2015   LISA BLAKEMAN 23-Mar-1966 [id number]  Referring provider: Schuyler Amor, MD [address] [place], [place] [address]  Chief Complaint  Patient presents with  . Nephrolithiasis    2wk with KUB    HPI: 49 year old male with a history of kidney stones presenting today status post ESWL for a 2 cm left ureteral calculus on 09/20/15.  He did have some fragmentation and distal migration of the stone into the more distal portion of the ureter. He underwent repeat ESWL on 11/15/15 for the residual fragments and returns today for follow-up.  He has had increasing flank pain since the procedure and reports passing no fragments. He denies any fevers or chills. He has been taking narcotic pain medication on a regular basis since then.  KUB today shows fairly significant left distal ureteral steinstrasse up to 2 cm in length.     PMH: Past Medical History  Diagnosis Date  . Diabetes mellitus without complication (HCC)   . Renal calculi   . Hypertension 09/05/2015  . Nephrolithiasis 09/05/2015  . FH: stroke 09/05/2015  . Hx of hypotestosteronemia 09/05/2015  . Hyperglycemia 09/05/2015  . Uncontrolled type 2 diabetes mellitus without complication (HCC) 09/05/2015    Surgical History: Past Surgical History  Procedure Laterality Date  . Cervical disc surgery  2006  . Extracorporeal shock wave lithotripsy Left 09/20/2015    Procedure: EXTRACORPOREAL SHOCK WAVE LITHOTRIPSY (ESWL);  Surgeon: Hildred Laser, MD;  Location: [facility];  Service: Urology;  Laterality: Left;  . Extracorporeal shock wave lithotripsy Left 11/15/2015    Procedure: EXTRACORPOREAL SHOCK WAVE LITHOTRIPSY (ESWL);  Surgeon: Vanna Scotland, MD;  Location: [facility];  Service: Urology;  Laterality: Left;    Home Medications:    Medication List       This list is accurate as of: 11/30/15  3:35 PM.  Always use your most recent med list.               acetaminophen 500 MG tablet   Commonly known as:  TYLENOL  Take 2 tablets (1,000 mg total) by mouth every 6 (six) hours as needed for mild pain or moderate pain.     HYDROcodone-acetaminophen 5-325 MG tablet  Commonly known as:  NORCO/VICODIN  Take 1-2 tablets by mouth every 6 (six) hours as needed for moderate pain.     lisinopril 10 MG tablet  Commonly known as:  PRINIVIL,ZESTRIL  Take 1 tablet (10 mg total) by mouth daily.     metFORMIN 500 MG tablet  Commonly known as:  GLUCOPHAGE  Take 1 tablet (500 mg total) by mouth 2 (two) times daily with a meal.     oxyCODONE-acetaminophen 5-325 MG tablet  Commonly known as:  PERCOCET  Take 1-2 tablets by mouth every 4 (four) hours as needed for moderate pain or severe pain.     tamsulosin 0.4 MG Caps capsule  Commonly known as:  FLOMAX  Take 1 capsule (0.4 mg total) by mouth daily.        Allergies: No Known Allergies  Family History: Family History  Problem Relation Age of Onset  . Stroke Father   . Nephrolithiasis Father   . Nephrolithiasis Father   . Prostate cancer Neg Hx   . Bladder Cancer Neg Hx     Social History:  reports that he has never smoked. He does not have any smokeless tobacco history on file. He reports that he does not drink alcohol or use illicit drugs.  ROS: UROLOGY Frequent Urination?: Yes Hard to postpone urination?: No Burning/pain with urination?: No Get up at night to urinate?: Yes Leakage of urine?: No Urine stream starts and stops?: Yes Trouble starting stream?: Yes Do you have to strain to urinate?: Yes Blood in urine?: No Urinary tract infection?: No Sexually transmitted disease?: No Injury to kidneys or bladder?: No Painful intercourse?: No Weak stream?: No Erection problems?: No Penile pain?: No  Gastrointestinal Nausea?: No Vomiting?: No Indigestion/heartburn?: No Diarrhea?: No Constipation?: No  Constitutional Fever: No Night sweats?: No Weight loss?: No Fatigue?: No  Skin Skin rash/lesions?:  No Itching?: No  Eyes Blurred vision?: No Double vision?: No  Ears/Nose/Throat Sore throat?: No Sinus problems?: No  Hematologic/Lymphatic Swollen glands?: No Easy bruising?: No  Cardiovascular Leg swelling?: No Chest pain?: No  Respiratory Cough?: No Shortness of breath?: No  Endocrine Excessive thirst?: No  Musculoskeletal Back pain?: No Joint pain?: No  Neurological Headaches?: No Dizziness?: No  Psychologic Depression?: No Anxiety?: No  Physical Exam: BP 138/79 mmHg  Pulse 81  Ht 6' (1.829 m)  Wt 217 lb 6.4 oz (98.612 kg)  BMI 29.48 kg/m2  Constitutional:  Alert and oriented, No acute distress. HEENT: [place] AT, moist mucus membranes.  Trachea midline, no masses. Cardiovascular: No clubbing, cyanosis, or edema, RRR Respiratory: Normal respiratory effort, no increased work of breathing, CTAB GI: Abdomen is soft, nontender, nondistended, no abdominal masses.   GU: LLQ tenderness.   Skin: No rashes, bruises or suspicious lesions. Neurologic: Grossly intact, no focal deficits, moving all 4 extremities. Psychiatric: Normal mood and affect.  Laboratory Data:   Urinalysis Results for orders placed or performed in visit on 11/30/15  Microscopic Examination  Result Value Ref Range   WBC, UA 0-5 0 -  5 /hpf   RBC, UA 0-2 0 -  2 /hpf   Epithelial Cells (non renal) None seen 0 - 10 /hpf   Mucus, UA Present (A) Not Estab.   Bacteria, UA Moderate (A) None seen/Few  Urinalysis, Complete  Result Value Ref Range   Specific Gravity, UA >1.030 (H) 1.005 - 1.030   pH, UA 5.0 5.0 - 7.5   Color, UA Yellow Yellow   Appearance Ur Clear Clear   Leukocytes, UA Negative Negative   Protein, UA Trace (A) Negative/Trace   Glucose, UA 2+ (A) Negative   Ketones, UA Negative Negative   RBC, UA Trace (A) Negative   Bilirubin, UA Negative Negative   Urobilinogen, Ur 0.2 0.2 - 1.0 mg/dL   Nitrite, UA Negative Negative   Microscopic Examination See below:     Pertinent  Imaging: CLINICAL DATA: Left lower quadrant pain. Left nephrolithiasis. Lithotripsy 3 weeks ago.  EXAM: ABDOMEN - 1 VIEW  COMPARISON: 11/15/2015, 11/09/2015 and 10/05/2015  FINDINGS: There is a 3 cm long column of multiple stone fragments in the distal left ureter. This column of stones has moved approximately 2 cm distal as compared to the prior study.  No appreciable renal calculi. Bowel gas pattern is normal.  IMPRESSION: 2 cm of distal progression of the column of multiple stones in the distal left ureter.   Electronically Signed  By: Francene BoyersJames Maxwell M.D.  On: 11/30/2015 08:45                                  Assessment & Plan:   1. Left ureteral stone Left distal ureteral steinstrasse status post ESWL 2. He has failed  this procedure and I counseled him today that he absolutely needs ureteroscopy to clear his left ureter fairly urgently given his degree of pain and duration of obstruction.  Risks and benefits of ureteroscopy were reviewed including but not limited to infection, bleeding, pain, ureteral injury which could require open surgery versus prolonged indwelling if ureteralperforation occurs, persistent stone disease, requirement for staged procedure, possible stent, and global anesthesia risks. Patient expressed understanding and desires to proceed with ureteroscopy.  Patient was advised to present to the emergency room more urgently should he develop uncontrolled pain, fevers, chills or any other warning symptoms.  - Urinalysis, Complete - CULTURE, URINE COMPREHENSIVE  2. Hydronephrosis with urinary obstruction due to ureteral calculus Secondary to above.  Will check BMP with preop labs.    3. Left flank pain Percocet 5/325 mg as needed for pain, disp #30   Return for schedule surgery.   Vanna Scotland, MD  [facility] [address], [address] [place], [place] [address] [phone]  I spent 25 min with  this patient of which greater than 50% was spent in counseling and coordination of care with the patient. Discussions were answered in detail.

## 2015-12-03 LAB — CULTURE, URINE COMPREHENSIVE

## 2015-12-04 ENCOUNTER — Encounter
Admission: RE | Admit: 2015-12-04 | Discharge: 2015-12-04 | Disposition: A | Discharge status: Home / Self Care | Payer: 59 | Source: Ambulatory Visit | Attending: Urology | Admitting: Urology

## 2015-12-04 DIAGNOSIS — E7800 Pure hypercholesterolemia, unspecified: Secondary | ICD-10-CM | POA: Diagnosis not present

## 2015-12-04 DIAGNOSIS — Z87442 Personal history of urinary calculi: Secondary | ICD-10-CM | POA: Diagnosis not present

## 2015-12-04 DIAGNOSIS — N132 Hydronephrosis with renal and ureteral calculous obstruction: Secondary | ICD-10-CM | POA: Diagnosis not present

## 2015-12-04 DIAGNOSIS — N139 Obstructive and reflux uropathy, unspecified: Secondary | ICD-10-CM | POA: Diagnosis not present

## 2015-12-04 DIAGNOSIS — Z79899 Other long term (current) drug therapy: Secondary | ICD-10-CM | POA: Diagnosis not present

## 2015-12-04 DIAGNOSIS — E1165 Type 2 diabetes mellitus with hyperglycemia: Secondary | ICD-10-CM | POA: Diagnosis not present

## 2015-12-04 DIAGNOSIS — Z7984 Long term (current) use of oral hypoglycemic drugs: Secondary | ICD-10-CM | POA: Diagnosis not present

## 2015-12-04 DIAGNOSIS — I10 Essential (primary) hypertension: Secondary | ICD-10-CM | POA: Diagnosis not present

## 2015-12-04 DIAGNOSIS — R1032 Left lower quadrant pain: Secondary | ICD-10-CM | POA: Diagnosis not present

## 2015-12-04 DIAGNOSIS — Z841 Family history of disorders of kidney and ureter: Secondary | ICD-10-CM | POA: Diagnosis not present

## 2015-12-04 DIAGNOSIS — N201 Calculus of ureter: Secondary | ICD-10-CM | POA: Diagnosis present

## 2015-12-04 DIAGNOSIS — Z823 Family history of stroke: Secondary | ICD-10-CM | POA: Diagnosis not present

## 2015-12-04 LAB — CBC
HEMATOCRIT: 39.8 % — AB (ref 40.0–52.0)
HEMOGLOBIN: 13.4 g/dL (ref 13.0–18.0)
MCH: 28.7 pg (ref 26.0–34.0)
MCHC: 33.6 g/dL (ref 32.0–36.0)
MCV: 85.4 fL (ref 80.0–100.0)
Platelets: 180 10*3/uL (ref 150–440)
RBC: 4.66 MIL/uL (ref 4.40–5.90)
RDW: 13.3 % (ref 11.5–14.5)
WBC: 5.5 10*3/uL (ref 3.8–10.6)

## 2015-12-04 LAB — BASIC METABOLIC PANEL
Anion gap: 8 (ref 5–15)
BUN: 26 mg/dL — ABNORMAL HIGH (ref 6–20)
CHLORIDE: 104 mmol/L (ref 101–111)
CO2: 26 mmol/L (ref 22–32)
CREATININE: 1.62 mg/dL — AB (ref 0.61–1.24)
Calcium: 9.3 mg/dL (ref 8.9–10.3)
GFR calc non Af Amer: 49 mL/min — ABNORMAL LOW (ref >60)
GFR, EST AFRICAN AMERICAN: 56 mL/min — AB (ref >60)
Glucose, Bld: 205 mg/dL — ABNORMAL HIGH (ref 65–99)
POTASSIUM: 4.1 mmol/L (ref 3.5–5.1)
SODIUM: 138 mmol/L (ref 135–145)

## 2015-12-04 NOTE — Patient Instructions (Signed)
  Your procedure is scheduled on: 12/05/15 Report to Day Surgery. To find out your arrival time please call [phone] between 1PM - 3PM on 12/04/15.  Remember: Instructions that are not followed completely may result in serious medical risk, up to and including death, or upon the discretion of your surgeon and anesthesiologist your surgery may need to be rescheduled.    __x__ 1. Do not eat food or drink liquids after midnight. No gum chewing or hard candies.     __x__ 2. No Alcohol for 24 hours before or after surgery.   ____ 3. Bring all medications with you on the day of surgery if instructed.    ___x_ 4. Notify your doctor if there is any change in your medical condition     (cold, fever, infections).     Do not wear jewelry, make-up, hairpins, clips or nail polish.  Do not wear lotions, powders, or perfumes. You may wear deodorant.  Do not shave 48 hours prior to surgery. Men may shave face and neck.  Do not bring valuables to the hospital.    [facility] any belongings or valuables.               Contacts, dentures or bridgework may not be worn into surgery.  Leave your suitcase in the car. After surgery it may be brought to your room.  For patients admitted to the hospital, discharge time is determined by your                treatment team.   Patients discharged the day of surgery will not be allowed to drive home.   Please read over the following fact sheets that you were given:   Surgical Site Infection Prevention   ____ Take these medicines the morning of surgery with A SIP OF WATER:    1.   2.   3.   4.  5.  6.  ____ Fleet Enema (as directed)   ____ Use CHG Soap as directed  ____ Use inhalers on the day of surgery  __x__ Stop metformin 2 days prior to surgery 12/03/15   ____ Take 1/2 of usual insulin dose the night before surgery and none on the morning of surgery.   ____ Stop Coumadin/Plavix/aspirin on   ____ Stop Anti-inflammatories  on today   ____ Stop supplements until after surgery.    ____ Bring C-Pap to the hospital.

## 2015-12-05 ENCOUNTER — Ambulatory Visit: Payer: 59 | Admitting: Anesthesiology

## 2015-12-05 ENCOUNTER — Encounter
Admission: RE | Disposition: A | Discharge status: Home / Self Care | Payer: Self-pay | Source: Ambulatory Visit | Attending: Urology

## 2015-12-05 ENCOUNTER — Ambulatory Visit
Admission: RE | Admit: 2015-12-05 | Discharge: 2015-12-05 | Disposition: A | Discharge status: Home / Self Care | Payer: 59 | Source: Ambulatory Visit | Attending: Urology | Admitting: Urology

## 2015-12-05 DIAGNOSIS — E7800 Pure hypercholesterolemia, unspecified: Secondary | ICD-10-CM | POA: Insufficient documentation

## 2015-12-05 DIAGNOSIS — Z79899 Other long term (current) drug therapy: Secondary | ICD-10-CM | POA: Diagnosis not present

## 2015-12-05 DIAGNOSIS — Z823 Family history of stroke: Secondary | ICD-10-CM | POA: Diagnosis not present

## 2015-12-05 DIAGNOSIS — N200 Calculus of kidney: Secondary | ICD-10-CM | POA: Diagnosis not present

## 2015-12-05 DIAGNOSIS — I10 Essential (primary) hypertension: Secondary | ICD-10-CM | POA: Diagnosis not present

## 2015-12-05 DIAGNOSIS — E1165 Type 2 diabetes mellitus with hyperglycemia: Secondary | ICD-10-CM | POA: Insufficient documentation

## 2015-12-05 DIAGNOSIS — R1032 Left lower quadrant pain: Secondary | ICD-10-CM | POA: Diagnosis not present

## 2015-12-05 DIAGNOSIS — Z7984 Long term (current) use of oral hypoglycemic drugs: Secondary | ICD-10-CM | POA: Insufficient documentation

## 2015-12-05 DIAGNOSIS — N132 Hydronephrosis with renal and ureteral calculous obstruction: Secondary | ICD-10-CM | POA: Diagnosis not present

## 2015-12-05 DIAGNOSIS — Z87442 Personal history of urinary calculi: Secondary | ICD-10-CM | POA: Insufficient documentation

## 2015-12-05 DIAGNOSIS — N139 Obstructive and reflux uropathy, unspecified: Secondary | ICD-10-CM | POA: Diagnosis not present

## 2015-12-05 DIAGNOSIS — N201 Calculus of ureter: Secondary | ICD-10-CM | POA: Diagnosis not present

## 2015-12-05 DIAGNOSIS — Z841 Family history of disorders of kidney and ureter: Secondary | ICD-10-CM | POA: Insufficient documentation

## 2015-12-05 HISTORY — PX: CYSTOSCOPY WITH STENT PLACEMENT: SHX5790

## 2015-12-05 HISTORY — PX: URETEROSCOPY WITH HOLMIUM LASER LITHOTRIPSY: SHX6645

## 2015-12-05 LAB — GLUCOSE, CAPILLARY
Glucose-Capillary: 118 mg/dL — ABNORMAL HIGH (ref 65–99)
Glucose-Capillary: 107 mg/dL — ABNORMAL HIGH (ref 65–99)

## 2015-12-05 SURGERY — URETEROSCOPY, WITH LITHOTRIPSY USING HOLMIUM LASER
Anesthesia: General | Laterality: Left | Wound class: Clean Contaminated

## 2015-12-05 MED ORDER — PROPOFOL 10 MG/ML IV BOLUS
INTRAVENOUS | Status: DC | PRN
  Administered 2015-12-05: 50 mg via INTRAVENOUS

## 2015-12-05 MED ORDER — MIDAZOLAM HCL 2 MG/2ML IJ SOLN
INTRAMUSCULAR | Status: DC | PRN
  Administered 2015-12-05: 2 mg via INTRAVENOUS

## 2015-12-05 MED ORDER — OXYCODONE-ACETAMINOPHEN 5-325 MG PO TABS: 1.0000 | ORAL_TABLET | ORAL | Status: DC | PRN

## 2015-12-05 MED ORDER — ONDANSETRON HCL 4 MG/2ML IJ SOLN
INTRAMUSCULAR | Status: DC | PRN
  Administered 2015-12-05: 4 mg via INTRAVENOUS

## 2015-12-05 MED ORDER — OXYCODONE HCL 5 MG PO TABS
5.0000 mg | ORAL_TABLET | Freq: Once | ORAL | Status: AC | PRN
Start: 2015-12-05 — End: 2015-12-05
  Administered 2015-12-05: 5 mg via ORAL

## 2015-12-05 MED ORDER — SODIUM CHLORIDE 0.9 % IV SOLN
INTRAVENOUS | Status: AC
  Administered 2015-12-05: 1 g via INTRAVENOUS
  Filled 2015-12-05: qty 1000

## 2015-12-05 MED ORDER — FENTANYL CITRATE (PF) 100 MCG/2ML IJ SOLN: 25.0000 ug | INTRAMUSCULAR | Status: DC | PRN

## 2015-12-05 MED ORDER — CEPHALEXIN 500 MG PO CAPS: 500.0000 mg | ORAL_CAPSULE | Freq: Three times a day (TID) | ORAL | Status: DC

## 2015-12-05 MED ORDER — FENTANYL CITRATE (PF) 100 MCG/2ML IJ SOLN
INTRAMUSCULAR | Status: DC | PRN
  Administered 2015-12-05 (×2): 50 ug via INTRAVENOUS

## 2015-12-05 MED ORDER — FAMOTIDINE 20 MG PO TABS
20.0000 mg | ORAL_TABLET | Freq: Once | ORAL | Status: AC
  Administered 2015-12-05: 20 mg via ORAL

## 2015-12-05 MED ORDER — OXYCODONE HCL 5 MG PO TABS
ORAL_TABLET | ORAL | Status: AC
  Filled 2015-12-05: qty 1

## 2015-12-05 MED ORDER — GENTAMICIN SULFATE 40 MG/ML IJ SOLN
80.0000 mg | Freq: Once | INTRAMUSCULAR | Status: DC
  Filled 2015-12-05: qty 2

## 2015-12-05 MED ORDER — SODIUM CHLORIDE 0.9 % IV SOLN
INTRAVENOUS | Status: DC
  Administered 2015-12-05: 11:00:00 via INTRAVENOUS

## 2015-12-05 MED ORDER — SODIUM CHLORIDE 0.9 % IV SOLN
1.0000 g | Freq: Once | INTRAVENOUS | Status: AC
Start: 2015-12-05 — End: 2015-12-05
  Administered 2015-12-05: 1 g via INTRAVENOUS

## 2015-12-05 MED ORDER — GENTAMICIN IN SALINE 1.6-0.9 MG/ML-% IV SOLN
80.0000 mg | Freq: Once | INTRAVENOUS | Status: AC
  Administered 2015-12-05: 80 mg via INTRAVENOUS
  Filled 2015-12-05: qty 50

## 2015-12-05 MED ORDER — KETOROLAC TROMETHAMINE 30 MG/ML IJ SOLN
INTRAMUSCULAR | Status: DC | PRN
  Administered 2015-12-05: 30 mg via INTRAVENOUS

## 2015-12-05 MED ORDER — FAMOTIDINE 20 MG PO TABS
ORAL_TABLET | ORAL | Status: AC
  Administered 2015-12-05: 20 mg via ORAL
  Filled 2015-12-05: qty 1

## 2015-12-05 MED ORDER — OXYCODONE HCL 5 MG/5ML PO SOLN: 5.0000 mg | Freq: Once | ORAL | Status: AC | PRN

## 2015-12-05 SURGICAL SUPPLY — 29 items
BACTOSHIELD CHG 4% 4OZ (MISCELLANEOUS) ×2
BASKET ZERO TIP 1.9FR (BASKET) ×6 IMPLANT
CATH URETL 5X70 OPEN END (CATHETERS) ×3 IMPLANT
CNTNR SPEC 2.5X3XGRAD LEK (MISCELLANEOUS) ×1
CONT SPEC 4OZ STER OR WHT (MISCELLANEOUS) ×2
CONTAINER SPEC 2.5X3XGRAD LEK (MISCELLANEOUS) ×1 IMPLANT
FEE TECHNICIAN ONLY PER HOUR (MISCELLANEOUS) IMPLANT
GLOVE BIO SURGEON STRL SZ7 (GLOVE) ×6 IMPLANT
GLOVE BIO SURGEON STRL SZ7.5 (GLOVE) ×3 IMPLANT
GOWN STRL REUS W/ TWL LRG LVL4 (GOWN DISPOSABLE) ×1 IMPLANT
GOWN STRL REUS W/TWL LRG LVL4 (GOWN DISPOSABLE) ×2
GOWN STRL REUS W/TWL XL LVL3 (GOWN DISPOSABLE) ×3 IMPLANT
GUIDEWIRE SUPER STIFF (WIRE) IMPLANT
KIT RM TURNOVER CYSTO AR (KITS) ×3 IMPLANT
LASER FIBER 200M SMARTSCOPE (Laser) ×3 IMPLANT
LASER HOLMIUM FIBER SU 272UM (MISCELLANEOUS) IMPLANT
PACK CYSTO AR (MISCELLANEOUS) ×3 IMPLANT
SCRUB CHG 4% DYNA-HEX 4OZ (MISCELLANEOUS) ×1 IMPLANT
SENSORWIRE 0.038 NOT ANGLED (WIRE) ×3
SET CYSTO W/LG BORE CLAMP LF (SET/KITS/TRAYS/PACK) ×3 IMPLANT
SHEATH URETERAL 13/15X36 1L (SHEATH) IMPLANT
SOL .9 NS 3000ML IRR  AL (IV SOLUTION) ×2
SOL .9 NS 3000ML IRR UROMATIC (IV SOLUTION) ×1 IMPLANT
STENT URET 6FRX24 CONTOUR (STENTS) ×3 IMPLANT
STENT URET 6FRX26 CONTOUR (STENTS) ×3 IMPLANT
SURGILUBE 2OZ TUBE FLIPTOP (MISCELLANEOUS) ×3 IMPLANT
SYRINGE IRR TOOMEY STRL 70CC (SYRINGE) ×3 IMPLANT
WATER STERILE IRR 1000ML POUR (IV SOLUTION) ×3 IMPLANT
WIRE SENSOR 0.038 NOT ANGLED (WIRE) ×1 IMPLANT

## 2015-12-05 NOTE — H&P (View-Only) (Signed)
3:35 PM  11/30/2015   Brian Butler 07/16/1966 [id number]  Referring provider: [name], MD [address] [place], [place] [address]  Chief Complaint  Patient presents with  . Nephrolithiasis    2wk with KUB    HPI: 50-year-old male with a history of kidney stones presenting today status post ESWL for a 2 cm left ureteral calculus on 09/20/15.  He did have some fragmentation and distal migration of the stone into the more distal portion of the ureter. He underwent repeat ESWL on 11/15/15 for the residual fragments and returns today for follow-up.  He has had increasing flank pain since the procedure and reports passing no fragments. He denies any fevers or chills. He has been taking narcotic pain medication on a regular basis since then.  KUB today shows fairly significant left distal ureteral steinstrasse up to 2 cm in length.     PMH: Past Medical History  Diagnosis Date  . Diabetes mellitus without complication (HCC)   . Renal calculi   . Hypertension 09/05/2015  . Nephrolithiasis 09/05/2015  . FH: stroke 09/05/2015  . Hx of hypotestosteronemia 09/05/2015  . Hyperglycemia 09/05/2015  . Uncontrolled type 2 diabetes mellitus without complication (HCC) 09/05/2015    Surgical History: Past Surgical History  Procedure Laterality Date  . Cervical disc surgery  2006  . Extracorporeal shock wave lithotripsy Left 09/20/2015    Procedure: EXTRACORPOREAL SHOCK WAVE LITHOTRIPSY (ESWL);  Surgeon: Brian James Budzyn, MD;  Location: [facility];  Service: Urology;  Laterality: Left;  . Extracorporeal shock wave lithotripsy Left 11/15/2015    Procedure: EXTRACORPOREAL SHOCK WAVE LITHOTRIPSY (ESWL);  Surgeon: Ethelreda Sukhu, MD;  Location: [facility];  Service: Urology;  Laterality: Left;    Home Medications:    Medication List       This list is accurate as of: 11/30/15  3:35 PM.  Always use your most recent med list.               acetaminophen 500 MG tablet   Commonly known as:  TYLENOL  Take 2 tablets (1,000 mg total) by mouth every 6 (six) hours as needed for mild pain or moderate pain.     HYDROcodone-acetaminophen 5-325 MG tablet  Commonly known as:  NORCO/VICODIN  Take 1-2 tablets by mouth every 6 (six) hours as needed for moderate pain.     lisinopril 10 MG tablet  Commonly known as:  PRINIVIL,ZESTRIL  Take 1 tablet (10 mg total) by mouth daily.     metFORMIN 500 MG tablet  Commonly known as:  GLUCOPHAGE  Take 1 tablet (500 mg total) by mouth 2 (two) times daily with a meal.     oxyCODONE-acetaminophen 5-325 MG tablet  Commonly known as:  PERCOCET  Take 1-2 tablets by mouth every 4 (four) hours as needed for moderate pain or severe pain.     tamsulosin 0.4 MG Caps capsule  Commonly known as:  FLOMAX  Take 1 capsule (0.4 mg total) by mouth daily.        Allergies: No Known Allergies  Family History: Family History  Problem Relation Age of Onset  . Stroke Father   . Nephrolithiasis Father   . Nephrolithiasis Father   . Prostate cancer Neg Hx   . Bladder Cancer Neg Hx     Social History:  reports that he has never smoked. He does not have any smokeless tobacco history on file. He reports that he does not drink alcohol or use illicit drugs.    ROS: UROLOGY Frequent Urination?: Yes Hard to postpone urination?: No Burning/pain with urination?: No Get up at night to urinate?: Yes Leakage of urine?: No Urine stream starts and stops?: Yes Trouble starting stream?: Yes Do you have to strain to urinate?: Yes Blood in urine?: No Urinary tract infection?: No Sexually transmitted disease?: No Injury to kidneys or bladder?: No Painful intercourse?: No Weak stream?: No Erection problems?: No Penile pain?: No  Gastrointestinal Nausea?: No Vomiting?: No Indigestion/heartburn?: No Diarrhea?: No Constipation?: No  Constitutional Fever: No Night sweats?: No Weight loss?: No Fatigue?: No  Skin Skin rash/lesions?:  No Itching?: No  Eyes Blurred vision?: No Double vision?: No  Ears/Nose/Throat Sore throat?: No Sinus problems?: No  Hematologic/Lymphatic Swollen glands?: No Easy bruising?: No  Cardiovascular Leg swelling?: No Chest pain?: No  Respiratory Cough?: No Shortness of breath?: No  Endocrine Excessive thirst?: No  Musculoskeletal Back pain?: No Joint pain?: No  Neurological Headaches?: No Dizziness?: No  Psychologic Depression?: No Anxiety?: No  Physical Exam: BP 138/79 mmHg  Pulse 81  Ht 6' (1.829 m)  Wt 217 lb 6.4 oz (98.612 kg)  BMI 29.48 kg/m2  Constitutional:  Alert and oriented, No acute distress. HEENT: [place] AT, moist mucus membranes.  Trachea midline, no masses. Cardiovascular: No clubbing, cyanosis, or edema, RRR Respiratory: Normal respiratory effort, no increased work of breathing, CTAB GI: Abdomen is soft, nontender, nondistended, no abdominal masses.   GU: LLQ tenderness.   Skin: No rashes, bruises or suspicious lesions. Neurologic: Grossly intact, no focal deficits, moving all 4 extremities. Psychiatric: Normal mood and affect.  Laboratory Data:   Urinalysis Results for orders placed or performed in visit on 11/30/15  Microscopic Examination  Result Value Ref Range   WBC, UA 0-5 0 -  5 /hpf   RBC, UA 0-2 0 -  2 /hpf   Epithelial Cells (non renal) None seen 0 - 10 /hpf   Mucus, UA Present (A) Not Estab.   Bacteria, UA Moderate (A) None seen/Few  Urinalysis, Complete  Result Value Ref Range   Specific Gravity, UA >1.030 (H) 1.005 - 1.030   pH, UA 5.0 5.0 - 7.5   Color, UA Yellow Yellow   Appearance Ur Clear Clear   Leukocytes, UA Negative Negative   Protein, UA Trace (A) Negative/Trace   Glucose, UA 2+ (A) Negative   Ketones, UA Negative Negative   RBC, UA Trace (A) Negative   Bilirubin, UA Negative Negative   Urobilinogen, Ur 0.2 0.2 - 1.0 mg/dL   Nitrite, UA Negative Negative   Microscopic Examination See below:     Pertinent  Imaging: CLINICAL DATA: Left lower quadrant pain. Left nephrolithiasis. Lithotripsy 3 weeks ago.  EXAM: ABDOMEN - 1 VIEW  COMPARISON: 11/15/2015, 11/09/2015 and 10/05/2015  FINDINGS: There is a 3 cm long column of multiple stone fragments in the distal left ureter. This column of stones has moved approximately 2 cm distal as compared to the prior study.  No appreciable renal calculi. Bowel gas pattern is normal.  IMPRESSION: 2 cm of distal progression of the column of multiple stones in the distal left ureter.   Electronically Signed  By: Francene BoyersJames Maxwell M.D.  On: 11/30/2015 08:45                                  Assessment & Plan:   1. Left ureteral stone Left distal ureteral steinstrasse status post ESWL 2. He has failed  this procedure and I counseled him today that he absolutely needs ureteroscopy to clear his left ureter fairly urgently given his degree of pain and duration of obstruction.  Risks and benefits of ureteroscopy were reviewed including but not limited to infection, bleeding, pain, ureteral injury which could require open surgery versus prolonged indwelling if ureteralperforation occurs, persistent stone disease, requirement for staged procedure, possible stent, and global anesthesia risks. Patient expressed understanding and desires to proceed with ureteroscopy.  Patient was advised to present to the emergency room more urgently should he develop uncontrolled pain, fevers, chills or any other warning symptoms.  - Urinalysis, Complete - CULTURE, URINE COMPREHENSIVE  2. Hydronephrosis with urinary obstruction due to ureteral calculus Secondary to above.  Will check BMP with preop labs.    3. Left flank pain Percocet 5/325 mg as needed for pain, disp #30   Return for schedule surgery.   Vanna ScotlandAshley Whitaker Holderman, MD  [facility] [address], [address] [place], [place] [address] [phone]  I spent 25 min with  this patient of which greater than 50% was spent in counseling and coordination of care with the patient. Discussions were answered in detail.

## 2015-12-05 NOTE — Discharge Instructions (Signed)
° ° °General Anesthesia, Adult, Care After °Refer to this sheet in the next few weeks. These instructions provide you with information on caring for yourself after your procedure. Your health care provider may also give you more specific instructions. Your treatment has been planned according to current medical practices, but problems sometimes occur. Call your health care provider if you have any problems or questions after your procedure. °WHAT TO EXPECT AFTER THE PROCEDURE °After the procedure, it is typical to experience: °Sleepiness. °Nausea and vomiting. °HOME CARE INSTRUCTIONS °For the first 24 hours after general anesthesia: °Have a responsible person with you. °Do not drive a car. If you are alone, do not take public transportation. °Do not drink alcohol. °Do not take medicine that has not been prescribed by your health care provider. °Do not sign important papers or make important decisions. °You may resume a normal diet and activities as directed by your health care provider. °Change bandages (dressings) as directed. °If you have questions or problems that seem related to general anesthesia, call the hospital and ask for the anesthetist or anesthesiologist on call. °SEEK MEDICAL CARE IF: °You have nausea and vomiting that continue the day after anesthesia. °You develop a rash. °SEEK IMMEDIATE MEDICAL CARE IF:  °You have difficulty breathing. °You have chest pain. °You have any allergic problems. °  °This information is not intended to replace advice given to you by your health care provider. Make sure you discuss any questions you have with your health care provider. °  °Document Released: 02/23/2001 Document Revised: 12/08/2014 Document Reviewed: 03/17/2012 °Elsevier Interactive Patient Education ©2016 [employer] °Ureteral Stent Implantation, Care After °Refer to this sheet in the next few weeks. These instructions provide you with information on caring for yourself after your procedure. Your health  care provider may also give you more specific instructions. Your treatment has been planned according to current medical practices, but problems sometimes occur. Call your health care provider if you have any problems or questions after your procedure. °WHAT TO EXPECT AFTER THE PROCEDURE °You should be back to normal activity within 48 hours after the procedure. Nausea and vomiting may occur and are commonly the result of anesthesia. °It is common to experience sharp pain in the back or lower abdomen and penis with voiding. This is caused by movement of the ends of the stent with the act of urinating. It usually goes away within minutes after you have stopped urinating. °HOME CARE INSTRUCTIONS °Make sure to drink plenty of fluids. You may have small amounts of bleeding, causing your urine to be red. This is normal. Certain movements may trigger pain or a feeling that you need to urinate. You may be given medicines to prevent infection or bladder spasms. Be sure to take all medicines as directed. Only take over-the-counter or prescription medicines for pain, discomfort, or fever as directed by your health care provider. Do not take aspirin, as this can make bleeding worse. °Your stent will be left in until the blockage is resolved. This may take 2 weeks or longer, depending on the reason for stent implantation. You may have an X-ray exam to make sure your ureter is open and that the stent has not moved out of position (migrated). The stent can be removed by your health care provider in the office. Medicines may be given for comfort while the stent is being removed. Be sure to keep all follow-up appointments so your health care provider can check that you are healing properly. °SEEK MEDICAL CARE   IF: °· You experience increasing pain. °· Your pain medicine is not working. °SEEK IMMEDIATE MEDICAL CARE IF: °· Your urine is dark red or has blood clots. °· You are leaking urine (incontinent). °· You have a fever, chills,  feeling sick to your stomach (nausea), or vomiting. °· Your pain is not relieved by pain medicine. °· The end of the stent comes out of the urethra. °· You are unable to urinate. °  °This information is not intended to replace advice given to you by your health care provider. Make sure you discuss any questions you have with your health care provider. °  °Document Released: 07/20/2013 Document Revised: 11/22/2013 Document Reviewed: 06/01/2015 °Elsevier Interactive Patient Education ©2016 [employer] ° °

## 2015-12-05 NOTE — Anesthesia Procedure Notes (Signed)
Procedure Name: LMA Insertion Date/Time: 12/05/2015 1:26 PM Performed by: Brian Butler, Brian Butler Pre-anesthesia Checklist: Patient identified, Patient being monitored, Timeout performed, Emergency Drugs available and Suction available Patient Re-evaluated:Patient Re-evaluated prior to inductionOxygen Delivery Method: Circle system utilized Preoxygenation: Pre-oxygenation with 100% oxygen Intubation Type: IV induction Ventilation: Mask ventilation without difficulty LMA: LMA inserted LMA Size: 4.0 Tube type: Oral Number of attempts: 1 Placement Confirmation: positive ETCO2 and breath sounds checked- equal and bilateral Tube secured with: Tape Dental Injury: Teeth and Oropharynx as per pre-operative assessment

## 2015-12-05 NOTE — Op Note (Signed)
Date of procedure: 12/05/2015  Preoperative diagnosis:  1. Left ureteral stones   Postoperative diagnosis:  1. Left impacted ureteral stones   Procedure: 1. Cystoscopy 2. Left ureteroscopy 3. Laser lithotripsy 4. Stone basketing 5. Left ureteral stent placement 6 French by 26 cm  Surgeon: Baruch Gouty, MD  Anesthesia: General  Complications: None  Intraoperative findings: The patient was noted to have steinstrasse with an impacted distal stone. Due to the impacted distal stone, not all fragments were able to be removed in this procedure.   EBL: None  Specimens: Left ureteral stone  Drains: 6 French by 26 cm left double-J ureteral stent  Disposition: Stable to the postanesthesia care unit  Indication for procedure: The patient is a 50 y.o. male with Steinstrasse a in the left ureter following lithotripsy presents for stone management.  After reviewing the management options for treatment, the patient elected to proceed with the above surgical procedure(s). We have discussed the potential benefits and risks of the procedure, side effects of the proposed treatment, the likelihood of the patient achieving the goals of the procedure, and any potential problems that might occur during the procedure or recuperation. Informed consent has been obtained.  Description of procedure: The patient was met in the preoperative area. All risks, benefits, and indications of the procedure were described in great detail. The patient consented to the procedure. Preoperative antibiotics were given. The patient was taken to the operative theater. General anesthesia was induced per the anesthesia service. The patient was then placed in the dorsal lithotomy position and prepped and draped in the usual sterile fashion. A preoperative timeout was called. A 21 French 30 cystoscope was inserted into the patient's bladder per urethra atraumatically. The left ureteral orifice was visualized and a sensor wire was  placed up to level the renal pelvis under fluoroscopy. The cystoscope was withdrawn. The semirigid ureteroscope was inserted in the patient's left ureteral orifice. In the distal ureter, the patient was noted to have an impacted left ureteral stone. It appeared to be within the mucosa of the ureter. The stone was broken with laser lithotripsy and removed. He still had large stone burden proximal to this, however due to the impacted nature of his left ureteral stone attempt to remove these stones were aborted to minimize further trauma to the ureter. The ureteroscope was withdrawn and the cystoscope was backloaded over the sensor wire. A 6 French by 26 cm double-J ureteral stent was placed over the sensor wire. The sensor wire was removed. Correct position was confirmed with a curl seen in the patient's left renal pelvis on fluoroscopy and a curl seen in the patient's urinary bladder direct visualization.  Plan: The patient will follow up for repeat ureteroscopy a few weeks after passive dilation of the ureter and allow the ureter to heal before further instrumentation.  Baruch Gouty, M.D.

## 2015-12-05 NOTE — Anesthesia Preprocedure Evaluation (Signed)
Anesthesia Evaluation  Patient identified by MRN, date of birth, ID band Patient awake    Reviewed: Allergy & Precautions, NPO status , Patient's Chart, lab work & pertinent test results  Airway Mallampati: II       Dental  (+) Teeth Intact   Pulmonary neg pulmonary ROS,    breath sounds clear to auscultation       Cardiovascular hypertension, Pt. on medications  Rhythm:Regular     Neuro/Psych    GI/Hepatic negative GI ROS, Neg liver ROS,   Endo/Other  diabetes, Type 2  Renal/GU      Musculoskeletal negative musculoskeletal ROS (+)   Abdominal Normal abdominal exam  (+)   Peds  Hematology negative hematology ROS (+)   Anesthesia Other Findings   Reproductive/Obstetrics                             Anesthesia Physical Anesthesia Plan  ASA: II  Anesthesia Plan: General   Post-op Pain Management:    Induction: Intravenous  Airway Management Planned: LMA  Additional Equipment:   Intra-op Plan:   Post-operative Plan: Extubation in OR  Informed Consent: I have reviewed the patients History and Physical, chart, labs and discussed the procedure including the risks, benefits and alternatives for the proposed anesthesia with the patient or authorized representative who has indicated his/her understanding and acceptance.     Plan Discussed with: CRNA  Anesthesia Plan Comments:         Anesthesia Quick Evaluation

## 2015-12-05 NOTE — Transfer of Care (Signed)
Immediate Anesthesia Transfer of Care Note  Patient: Brian Butler  Procedure(s) Performed: Procedure(s): URETEROSCOPY WITH HOLMIUM LASER LITHOTRIPSY (Left) CYSTOSCOPY WITH STENT PLACEMENT (Left)  Patient Location: PACU  Anesthesia Type:General  Level of Consciousness: sedated and responds to stimulation  Airway & Oxygen Therapy: Patient Spontanous Breathing and Patient connected to face mask oxygen  Post-op Assessment: Report given to RN and Post -op Vital signs reviewed and stable  Post vital signs: Reviewed and stable  Last Vitals:  Filed Vitals:   12/05/15 1035 12/05/15 1404  BP: 131/80 90/56  Pulse: 70 58  Temp: 36.3 C 37 C  Resp: 16 8    Complications: No apparent anesthesia complications

## 2015-12-05 NOTE — Interval H&P Note (Signed)
History and Physical Interval Note:  12/05/2015 11:39 AM  Brian SlipperLarry J Louks  has presented today for surgery, with the diagnosis of NEPHROLITHIASIS  The various methods of treatment have been discussed with the patient and family. After consideration of risks, benefits and other options for treatment, the patient has consented to  Procedure(s): URETEROSCOPY WITH HOLMIUM LASER LITHOTRIPSY (Left) CYSTOSCOPY WITH STENT PLACEMENT (Left) as a surgical intervention .  The patient's history has been reviewed, patient examined, no change in status, stable for surgery.  I have reviewed the patient's chart and labs.  Questions were answered to the patient's satisfaction.     RRR Unlabored respirations  Cloyde ReamsBrian James MelmoreBudzyn

## 2015-12-06 ENCOUNTER — Telehealth: Payer: Self-pay | Admitting: Radiology

## 2015-12-06 ENCOUNTER — Encounter: Payer: Self-pay | Admitting: Urology

## 2015-12-06 NOTE — Anesthesia Postprocedure Evaluation (Signed)
Anesthesia Post Note  Patient: Brian Butler  Procedure(s) Performed: Procedure(s) (LRB): URETEROSCOPY WITH HOLMIUM LASER LITHOTRIPSY (Left) CYSTOSCOPY WITH STENT PLACEMENT (Left)  Patient location during evaluation: PACU Anesthesia Type: General Level of consciousness: awake and alert Pain management: satisfactory to patient Vital Signs Assessment: post-procedure vital signs reviewed and stable Respiratory status: nonlabored ventilation Cardiovascular status: stable Anesthetic complications: no    Last Vitals:  Filed Vitals:   12/05/15 1536 12/05/15 1557  BP: 151/83 148/78  Pulse: 62 78  Temp:    Resp: 14 14    Last Pain:  Filed Vitals:   12/05/15 1558  PainSc: 5                  VAN STAVEREN,Alfhild Partch

## 2015-12-06 NOTE — Telephone Encounter (Signed)
Called pt to notify of surgery scheduled 01/02/16. Pt states he has been passing large amounts of stone fragments and feels surgery may not be necessary. Per Dr Sherryl BartersBudzyn, pt was advised to get a KUB & RTC on 12/20/15 @ 9:45. Pt voices understanding.

## 2015-12-18 ENCOUNTER — Telehealth: Payer: Self-pay

## 2015-12-18 MED ORDER — LISINOPRIL 10 MG PO TABS
10.0000 mg | ORAL_TABLET | Freq: Every day | ORAL | Status: DC
Start: 2015-12-18 — End: 2016-06-16

## 2015-12-18 NOTE — Telephone Encounter (Signed)
Sent message to [facility] 

## 2015-12-18 NOTE — Addendum Note (Signed)
Addended by: Schuyler Amor on: 12/18/2015 12:30 PM   Modules accepted: Orders

## 2015-12-19 ENCOUNTER — Other Ambulatory Visit: Payer: Self-pay

## 2015-12-19 NOTE — Telephone Encounter (Signed)
Rx sent yesterday

## 2015-12-19 NOTE — Telephone Encounter (Signed)
Sent to [facility] 

## 2015-12-20 ENCOUNTER — Other Ambulatory Visit: Payer: Self-pay

## 2015-12-20 ENCOUNTER — Ambulatory Visit
Admission: RE | Admit: 2015-12-20 | Discharge: 2015-12-20 | Disposition: A | Discharge status: Home / Self Care | Payer: 59 | Source: Ambulatory Visit | Attending: Urology | Admitting: Urology

## 2015-12-20 ENCOUNTER — Ambulatory Visit (INDEPENDENT_AMBULATORY_CARE_PROVIDER_SITE_OTHER): Payer: 59 | Admitting: Urology

## 2015-12-20 ENCOUNTER — Encounter: Payer: Self-pay | Admitting: Urology

## 2015-12-20 VITALS — BP 149/74 | HR 58 | Ht 72.0 in | Wt 214.1 lb

## 2015-12-20 DIAGNOSIS — N200 Calculus of kidney: Secondary | ICD-10-CM | POA: Diagnosis not present

## 2015-12-20 LAB — URINALYSIS, COMPLETE
Bilirubin, UA: NEGATIVE
GLUCOSE, UA: NEGATIVE
KETONES UA: NEGATIVE
NITRITE UA: NEGATIVE
SPEC GRAV UA: 1.025 (ref 1.005–1.030)
UUROB: 0.2 mg/dL (ref 0.2–1.0)
pH, UA: 5.5 (ref 5.0–7.5)

## 2015-12-20 LAB — MICROSCOPIC EXAMINATION: Epithelial Cells (non renal): NONE SEEN /hpf (ref 0–10)

## 2015-12-20 IMAGING — CR DG ABDOMEN 1V
1 series · 1 of 1 positions shown · non-contrast
Comparison: [DATE]

CLINICAL DATA: Recent lithotripsy.  No pain today.

EXAM:
ABDOMEN - 1 VIEW

[dg abd 1 view]
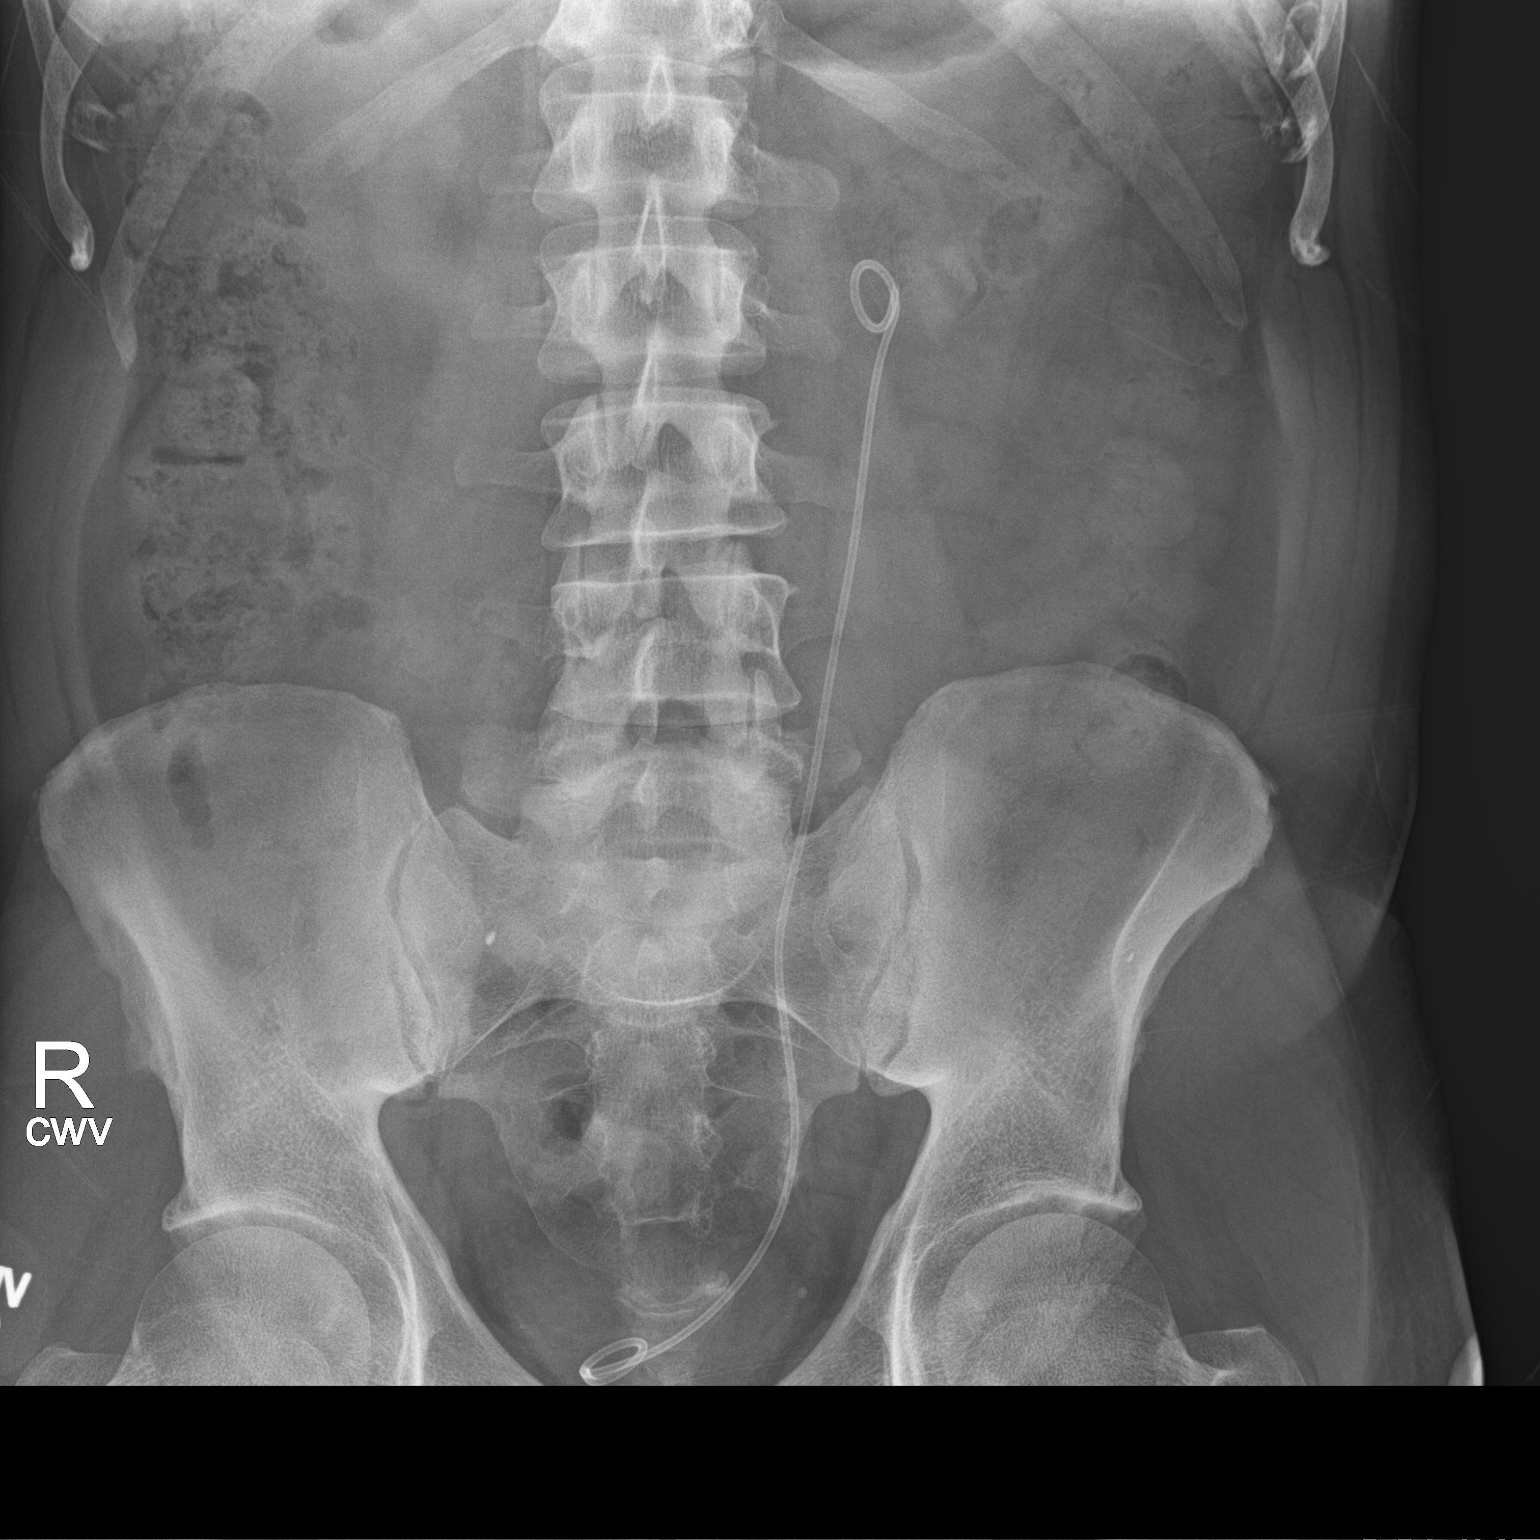

[1 of 1 positions shown; findings below may reference images not displayed]

FINDINGS: No renal stones are identified. No right ureteral stones are noted.
A left-sided ureteral stent is identified. The column of stones seen
adjacent to the distal stent on the previous study are less
prominent in the interval. However, I suspect multiple small stones
remain. A phlebolith is seen in the left pelvis. No other interval
changes or acute abnormalities.
IMPRESSION: Several small stones remain around the distal left ureteral stent,
less prominent in the interval.

## 2015-12-20 NOTE — Progress Notes (Signed)
12/20/2015 9:54 AM   Brian Butler 1966/04/15 [id number]  Referring provider: Schuyler Amor, MD [address] [place], [place] [address]  Chief Complaint  Patient presents with  . Routine Post Op    ureteroscopy w/ holium laser lithotripsy nephrolithoasis     HPI: 50 year old male with a history of kidney stones presenting today status post ESWL for a 2 cm left ureteral calculus on 09/20/15. He did have some fragmentation and distal migration of the stone into the more distal portion of the ureter. He underwent repeat ESWL on 11/15/15 for the residual fragments and returns today for follow-up.  He has had increasing flank pain since the procedure and reports passing no fragments. He denies any fevers or chills. He has been taking narcotic pain medication on a regular basis since then.  KUB today shows fairly significant left distal ureteral steinstrasse up to 2 cm in length.   Interval history: The patient went ureteroscopy but not all stone for laser was removed due to the impacted stone causing tremor the ureter. The large lead stone of the Steinstrasse was removed so. The patient since has passed multiple stone fragments. He did not want to go and repeat ureteroscopy until he had a repeat x-ray. X-ray today does show still residual stone burden in the left distal ureter with the stent correctly placed.    PMH: Past Medical History  Diagnosis Date  . Diabetes mellitus without complication (HCC)   . Renal calculi   . Hypertension 09/05/2015  . Nephrolithiasis 09/05/2015  . FH: stroke 09/05/2015  . Hx of hypotestosteronemia 09/05/2015  . Hyperglycemia 09/05/2015  . Uncontrolled type 2 diabetes mellitus without complication (HCC) 09/05/2015    Surgical History: Past Surgical History  Procedure Laterality Date  . Cervical disc surgery  2006  . Extracorporeal shock wave lithotripsy Left 09/20/2015    Procedure: EXTRACORPOREAL SHOCK WAVE LITHOTRIPSY (ESWL);  Surgeon:  Hildred Laser, MD;  Location: [facility];  Service: Urology;  Laterality: Left;  . Extracorporeal shock wave lithotripsy Left 11/15/2015    Procedure: EXTRACORPOREAL SHOCK WAVE LITHOTRIPSY (ESWL);  Surgeon: Vanna Scotland, MD;  Location: [facility] OR[facility];  Service: Urology;  Laterality: Left;  . Ureteroscopy with holmium laser lithotripsy Left 12/05/2015    Procedure: URETEROSCOPY WITH HOLMIUM LASER LITHOTRIPSY;  Surgeon: Hildred Laser, MD;  Location: [facility];  Service: Urology;  Laterality: Left;  . Cystoscopy with stent placement Left 12/05/2015    Procedure: CYSTOSCOPY WITH STENT PLACEMENT;  Surgeon: Hildred Laser, MD;  Location: [facility] OR[facility];  Service: Urology;  Laterality: Left;    Home Medications:    Medication List       This list is accurate as of: 12/20/15  9:54 AM.  Always use your most recent med list.               cephALEXin 500 MG capsule  Commonly known as:  KEFLEX  Take 1 capsule (500 mg total) by mouth 3 (three) times daily.     lisinopril 10 MG tablet  Commonly known as:  PRINIVIL,ZESTRIL  Take 1 tablet (10 mg total) by mouth daily.     metFORMIN 500 MG tablet  Commonly known as:  GLUCOPHAGE  Take 1 tablet (500 mg total) by mouth 2 (two) times daily with a meal.     oxyCODONE-acetaminophen 5-325 MG tablet  Commonly known as:  PERCOCET  Take 1-2 tablets by mouth every 4 (four) hours as needed for moderate pain or severe pain.  tamsulosin 0.4 MG Caps capsule  Commonly known as:  FLOMAX  Take 1 capsule (0.4 mg total) by mouth daily.        Allergies: No Known Allergies  Family History: Family History  Problem Relation Age of Onset  . Stroke Father   . Nephrolithiasis Father   . Nephrolithiasis Father   . Prostate cancer Neg Hx   . Bladder Cancer Neg Hx     Social History:  reports that he has never smoked. He does not have any smokeless tobacco history on file. He reports that he does not drink alcohol or use illicit  drugs.  ROS: UROLOGY Frequent Urination?: No Hard to postpone urination?: No Burning/pain with urination?: No Get up at night to urinate?: Yes Leakage of urine?: No Urine stream starts and stops?: No Trouble starting stream?: No Do you have to strain to urinate?: No Blood in urine?: No Urinary tract infection?: No Sexually transmitted disease?: No Injury to kidneys or bladder?: No Painful intercourse?: No Weak stream?: No Erection problems?: No Penile pain?: No  Gastrointestinal Nausea?: No Vomiting?: No Indigestion/heartburn?: No Diarrhea?: No Constipation?: No  Constitutional Fever: No Night sweats?: No Weight loss?: No Fatigue?: No  Skin Skin rash/lesions?: No Itching?: No  Eyes Blurred vision?: No Double vision?: No  Ears/Nose/Throat Sore throat?: No Sinus problems?: No  Hematologic/Lymphatic Swollen glands?: No Easy bruising?: No  Cardiovascular Leg swelling?: No Chest pain?: No  Respiratory Cough?: No Shortness of breath?: No  Endocrine Excessive thirst?: No  Musculoskeletal Back pain?: No Joint pain?: No  Neurological Headaches?: No Dizziness?: No  Psychologic Depression?: No Anxiety?: No  Physical Exam: BP 149/74 mmHg  Pulse 58  Ht 6' (1.829 m)  Wt 214 lb 1.6 oz (97.115 kg)  BMI 29.03 kg/m2  Constitutional:  Alert and oriented, No acute distress. HEENT: [place] AT, moist mucus membranes.  Trachea midline, no masses. Cardiovascular: No clubbing, cyanosis, or edema. Respiratory: Normal respiratory effort, no increased work of breathing. GI: Abdomen is soft, nontender, nondistended, no abdominal masses GU: No CVA tenderness.  Skin: No rashes, bruises or suspicious lesions. Lymph: No cervical or inguinal adenopathy. Neurologic: Grossly intact, no focal deficits, moving all 4 extremities. Psychiatric: Normal mood and affect.  Laboratory Data: Lab Results  Component Value Date   WBC 5.5 12/04/2015   HGB 13.4 12/04/2015   HCT  39.8* 12/04/2015   MCV 85.4 12/04/2015   PLT 180 12/04/2015    Lab Results  Component Value Date   CREATININE 1.62* 12/04/2015    No results found for: PSA  Lab Results  Component Value Date   TESTOSTERONE 244* 09/05/2015    Lab Results  Component Value Date   HGBA1C 7.7* 09/05/2015    Urinalysis    Component Value Date/Time   COLORURINE YELLOW 09/08/2015 1304   COLORURINE Yellow 04/13/2013 1423   APPEARANCEUR CLEAR 09/08/2015 1304   APPEARANCEUR Hazy 04/13/2013 1423   LABSPEC 1.025 09/08/2015 1304   LABSPEC 1.037 04/13/2013 1423   PHURINE 6.0 09/08/2015 1304   PHURINE 5.0 04/13/2013 1423   GLUCOSEU 2+* 11/30/2015 0922   GLUCOSEU >=500 04/13/2013 1423   HGBUR NEGATIVE 09/08/2015 1304   HGBUR 3+ 04/13/2013 1423   BILIRUBINUR Negative 11/30/2015 0922   BILIRUBINUR NEGATIVE 09/08/2015 1304   BILIRUBINUR neg 09/05/2015 1139   BILIRUBINUR Negative 04/13/2013 1423   KETONESUR NEGATIVE 09/08/2015 1304   KETONESUR Negative 04/13/2013 1423   PROTEINUR NEGATIVE 09/08/2015 1304   PROTEINUR neg 09/05/2015 1139   PROTEINUR 30 mg/dL 86/57/8469 6295  UROBILINOGEN 0.2 09/05/2015 1139   NITRITE Negative 11/30/2015 0922   NITRITE NEGATIVE 09/08/2015 1304   NITRITE neg 09/05/2015 1139   NITRITE Negative 04/13/2013 1423   LEUKOCYTESUR Negative 11/30/2015 0922   LEUKOCYTESUR NEGATIVE 09/08/2015 1304   LEUKOCYTESUR Negative 04/13/2013 1423    Pertinent Imaging:  CLINICAL DATA: Recent lithotripsy. No pain today.  EXAM: ABDOMEN - 1 VIEW  COMPARISON: November 30, 2015  FINDINGS: No renal stones are identified. No right ureteral stones are noted. A left-sided ureteral stent is identified. The column of stones seen adjacent to the distal stent on the previous study are less prominent in the interval. However, I suspect multiple small stones remain. A phlebolith is seen in the left pelvis. No other interval changes or acute abnormalities.  IMPRESSION: Several  small stones remain around the distal left ureteral stent, less prominent in the interval.      Assessment & Plan:   1. Left ureteral stone -repeat cystoscopy, left ureteroscopy, laser lithotripsy, left ureteral stent exchange  No Follow-up on file.  Hildred Laser, MD  [facility] [address] [place], [place] [address] [phone]

## 2015-12-22 LAB — URINE CULTURE: Organism ID, Bacteria: NO GROWTH

## 2016-01-09 ENCOUNTER — Ambulatory Visit (INDEPENDENT_AMBULATORY_CARE_PROVIDER_SITE_OTHER): Payer: 59 | Admitting: Podiatry

## 2016-01-09 ENCOUNTER — Encounter: Payer: Self-pay | Admitting: Podiatry

## 2016-01-09 VITALS — BP 132/81 | HR 82 | Resp 16

## 2016-01-09 DIAGNOSIS — M722 Plantar fascial fibromatosis: Secondary | ICD-10-CM

## 2016-01-09 MED ORDER — TRIAMCINOLONE ACETONIDE 10 MG/ML IJ SUSP
10.0000 mg | Freq: Once | INTRAMUSCULAR | Status: AC
  Administered 2016-01-09: 10 mg

## 2016-01-09 NOTE — Patient Instructions (Signed)

## 2016-01-10 NOTE — Progress Notes (Signed)
Subjective:     Patient ID: Brian Butler, male   DOB: 1966/05/14, 50 y.o.   MRN: [id number]  HPI patient states my left heel has been awful and really hurting me and I'm also having a lot of problems with kidney stones   Review of Systems     Objective:   Physical Exam Neurovascular status intact muscle strength adequate with severe discomfort of the plantar heel left at the insertional point tendon calcaneus with fluid buildup noted around the medial band. Patient again is also having issues with kidney and is due for a procedure and the next several weeks    Assessment:     Acute plantar fasciitis left with severe discomfort at the insertion to the tendon calcaneus    Plan:     Reviewed condition and at this time  Completely immobilized with boot and air fracture walker was dispensed. I also went ahead and I injected the plantar fascia 3 mg Kenalog 5 mg Xylocaine to reduce inflammation and I will see back in 4 weeks. I gave instructions it is possible this will require surgery if symptoms do not reduce

## 2016-01-16 ENCOUNTER — Telehealth: Payer: Self-pay

## 2016-01-16 ENCOUNTER — Encounter
Admission: RE | Disposition: A | Discharge status: Home / Self Care | Payer: Self-pay | Source: Ambulatory Visit | Attending: Urology

## 2016-01-16 ENCOUNTER — Ambulatory Visit: Payer: 59 | Admitting: Certified Registered Nurse Anesthetist

## 2016-01-16 ENCOUNTER — Encounter: Payer: Self-pay | Admitting: *Deleted

## 2016-01-16 ENCOUNTER — Ambulatory Visit
Admission: RE | Admit: 2016-01-16 | Discharge: 2016-01-16 | Disposition: A | Discharge status: Home / Self Care | Payer: 59 | Source: Ambulatory Visit | Attending: Urology | Admitting: Urology

## 2016-01-16 DIAGNOSIS — Z823 Family history of stroke: Secondary | ICD-10-CM | POA: Insufficient documentation

## 2016-01-16 DIAGNOSIS — N200 Calculus of kidney: Secondary | ICD-10-CM | POA: Diagnosis not present

## 2016-01-16 DIAGNOSIS — I10 Essential (primary) hypertension: Secondary | ICD-10-CM | POA: Diagnosis not present

## 2016-01-16 DIAGNOSIS — Z7984 Long term (current) use of oral hypoglycemic drugs: Secondary | ICD-10-CM | POA: Insufficient documentation

## 2016-01-16 DIAGNOSIS — Z87442 Personal history of urinary calculi: Secondary | ICD-10-CM | POA: Diagnosis not present

## 2016-01-16 DIAGNOSIS — R109 Unspecified abdominal pain: Secondary | ICD-10-CM | POA: Insufficient documentation

## 2016-01-16 DIAGNOSIS — Z841 Family history of disorders of kidney and ureter: Secondary | ICD-10-CM | POA: Insufficient documentation

## 2016-01-16 DIAGNOSIS — E119 Type 2 diabetes mellitus without complications: Secondary | ICD-10-CM | POA: Diagnosis not present

## 2016-01-16 DIAGNOSIS — N201 Calculus of ureter: Secondary | ICD-10-CM | POA: Insufficient documentation

## 2016-01-16 DIAGNOSIS — Z79899 Other long term (current) drug therapy: Secondary | ICD-10-CM | POA: Diagnosis not present

## 2016-01-16 DIAGNOSIS — Z466 Encounter for fitting and adjustment of urinary device: Secondary | ICD-10-CM | POA: Diagnosis not present

## 2016-01-16 HISTORY — PX: CYSTOSCOPY WITH STENT PLACEMENT: SHX5790

## 2016-01-16 HISTORY — PX: URETEROSCOPY WITH HOLMIUM LASER LITHOTRIPSY: SHX6645

## 2016-01-16 HISTORY — PX: CYSTOSCOPY W/ RETROGRADES: SHX1426

## 2016-01-16 LAB — GLUCOSE, CAPILLARY: Glucose-Capillary: 139 mg/dL — ABNORMAL HIGH (ref 65–99)

## 2016-01-16 SURGERY — URETEROSCOPY, WITH LITHOTRIPSY USING HOLMIUM LASER
Anesthesia: General | Laterality: Left | Wound class: Clean Contaminated

## 2016-01-16 MED ORDER — HYDROMORPHONE HCL 1 MG/ML IJ SOLN
INTRAMUSCULAR | Status: DC | PRN
  Administered 2016-01-16: 1 mg via INTRAVENOUS

## 2016-01-16 MED ORDER — HYDROCODONE-ACETAMINOPHEN 5-325 MG PO TABS: 1.0000 | ORAL_TABLET | Freq: Four times a day (QID) | ORAL | Status: DC | PRN

## 2016-01-16 MED ORDER — FENTANYL CITRATE (PF) 100 MCG/2ML IJ SOLN
INTRAMUSCULAR | Status: DC | PRN
  Administered 2016-01-16: 100 ug via INTRAVENOUS

## 2016-01-16 MED ORDER — MIDAZOLAM HCL 2 MG/2ML IJ SOLN
INTRAMUSCULAR | Status: DC | PRN
Start: 2016-01-16 — End: 2016-01-16
  Administered 2016-01-16: 1 mg via INTRAVENOUS

## 2016-01-16 MED ORDER — CEPHALEXIN 500 MG PO CAPS: 500.0000 mg | ORAL_CAPSULE | Freq: Three times a day (TID) | ORAL | Status: DC

## 2016-01-16 MED ORDER — DEXAMETHASONE SODIUM PHOSPHATE 4 MG/ML IJ SOLN
INTRAMUSCULAR | Status: DC | PRN
  Administered 2016-01-16: 5 mg via INTRAVENOUS

## 2016-01-16 MED ORDER — FENTANYL CITRATE (PF) 100 MCG/2ML IJ SOLN: 25.0000 ug | INTRAMUSCULAR | Status: DC | PRN

## 2016-01-16 MED ORDER — CEFAZOLIN SODIUM-DEXTROSE 2-3 GM-% IV SOLR: 2.0000 g | Freq: Once | INTRAVENOUS | Status: DC

## 2016-01-16 MED ORDER — FAMOTIDINE 20 MG PO TABS
ORAL_TABLET | ORAL | Status: AC
  Filled 2016-01-16: qty 1

## 2016-01-16 MED ORDER — SODIUM CHLORIDE 0.9 % IV SOLN
INTRAVENOUS | Status: DC
  Administered 2016-01-16: 11:00:00 via INTRAVENOUS

## 2016-01-16 MED ORDER — GLYCOPYRROLATE 0.2 MG/ML IJ SOLN
INTRAMUSCULAR | Status: DC | PRN
  Administered 2016-01-16: 0.2 mg via INTRAVENOUS

## 2016-01-16 MED ORDER — LIDOCAINE HCL (CARDIAC) 20 MG/ML IV SOLN
INTRAVENOUS | Status: DC | PRN
  Administered 2016-01-16: 100 mg via INTRAVENOUS

## 2016-01-16 MED ORDER — IOTHALAMATE MEGLUMINE 43 % IV SOLN
INTRAVENOUS | Status: DC | PRN
  Administered 2016-01-16: 25 mL

## 2016-01-16 MED ORDER — PHENYLEPHRINE HCL 10 MG/ML IJ SOLN
INTRAMUSCULAR | Status: DC | PRN
  Administered 2016-01-16: 100 ug via INTRAVENOUS

## 2016-01-16 MED ORDER — CEFAZOLIN SODIUM-DEXTROSE 2-3 GM-% IV SOLR
INTRAVENOUS | Status: AC
  Filled 2016-01-16: qty 50

## 2016-01-16 MED ORDER — FAMOTIDINE 20 MG PO TABS: 20.0000 mg | ORAL_TABLET | Freq: Once | ORAL | Status: DC

## 2016-01-16 MED ORDER — KETOROLAC TROMETHAMINE 30 MG/ML IJ SOLN
INTRAMUSCULAR | Status: DC | PRN
  Administered 2016-01-16: 15 mg via INTRAVENOUS

## 2016-01-16 MED ORDER — ONDANSETRON HCL 4 MG/2ML IJ SOLN: 4.0000 mg | Freq: Once | INTRAMUSCULAR | Status: DC | PRN

## 2016-01-16 MED ORDER — ONDANSETRON HCL 4 MG/2ML IJ SOLN
INTRAMUSCULAR | Status: DC | PRN
  Administered 2016-01-16: 4 mg via INTRAVENOUS

## 2016-01-16 MED ORDER — PROPOFOL 10 MG/ML IV BOLUS
INTRAVENOUS | Status: DC | PRN
  Administered 2016-01-16: 180 mg via INTRAVENOUS

## 2016-01-16 SURGICAL SUPPLY — 33 items
BACTOSHIELD CHG 4% 4OZ (MISCELLANEOUS) ×2
BASKET ZERO TIP 1.9FR (BASKET) ×3 IMPLANT
CATH URETL 5X70 OPEN END (CATHETERS) ×3 IMPLANT
CNTNR SPEC 2.5X3XGRAD LEK (MISCELLANEOUS) ×1
CONRAY 43 FOR UROLOGY 50M (MISCELLANEOUS) ×3 IMPLANT
CONT SPEC 4OZ STER OR WHT (MISCELLANEOUS) ×2
CONTAINER SPEC 2.5X3XGRAD LEK (MISCELLANEOUS) ×1 IMPLANT
FEE TECHNICIAN ONLY PER HOUR (MISCELLANEOUS) IMPLANT
GLOVE BIO SURGEON STRL SZ7 (GLOVE) ×6 IMPLANT
GLOVE BIO SURGEON STRL SZ7.5 (GLOVE) ×3 IMPLANT
GOWN STRL REUS W/ TWL LRG LVL3 (GOWN DISPOSABLE) ×1 IMPLANT
GOWN STRL REUS W/ TWL LRG LVL4 (GOWN DISPOSABLE) ×1 IMPLANT
GOWN STRL REUS W/TWL LRG LVL3 (GOWN DISPOSABLE) ×2
GOWN STRL REUS W/TWL LRG LVL4 (GOWN DISPOSABLE) ×2
GOWN STRL REUS W/TWL XL LVL3 (GOWN DISPOSABLE) ×3 IMPLANT
GUIDEWIRE SUPER STIFF (WIRE) IMPLANT
KIT RM TURNOVER CYSTO AR (KITS) ×3 IMPLANT
LASER FIBER 200M SMARTSCOPE (Laser) IMPLANT
LASER FIBER 365M SMARTSCOPE (Laser) ×3 IMPLANT
LASER HOLMIUM FIBER SU 272UM (MISCELLANEOUS) IMPLANT
PACK CYSTO AR (MISCELLANEOUS) ×3 IMPLANT
SCRUB CHG 4% DYNA-HEX 4OZ (MISCELLANEOUS) ×1 IMPLANT
SENSORWIRE 0.038 NOT ANGLED (WIRE) ×3
SET CYSTO W/LG BORE CLAMP LF (SET/KITS/TRAYS/PACK) ×3 IMPLANT
SHEATH URETERAL 13/15X36 1L (SHEATH) IMPLANT
SOL .9 NS 3000ML IRR  AL (IV SOLUTION) ×2
SOL .9 NS 3000ML IRR UROMATIC (IV SOLUTION) ×1 IMPLANT
STENT URET 6FRX24 CONTOUR (STENTS) IMPLANT
STENT URET 6FRX26 CONTOUR (STENTS) ×3 IMPLANT
SURGILUBE 2OZ TUBE FLIPTOP (MISCELLANEOUS) ×3 IMPLANT
SYRINGE IRR TOOMEY STRL 70CC (SYRINGE) ×3 IMPLANT
WATER STERILE IRR 1000ML POUR (IV SOLUTION) ×3 IMPLANT
WIRE SENSOR 0.038 NOT ANGLED (WIRE) ×1 IMPLANT

## 2016-01-16 NOTE — Anesthesia Preprocedure Evaluation (Addendum)
Anesthesia Evaluation  Patient identified by MRN, date of birth, ID band Patient awake    Reviewed: Allergy & Precautions, NPO status , Patient's Chart, lab work & pertinent test results  Airway Mallampati: II       Dental  (+) Teeth Intact, Caps, Missing   Pulmonary neg pulmonary ROS,    breath sounds clear to auscultation       Cardiovascular Exercise Tolerance: Good hypertension, Pt. on medications (-) angina(-) CAD, (-) Past MI, (-) Cardiac Stents and (-) CABG (-) dysrhythmias (-) Valvular Problems/Murmurs Rhythm:Regular     Neuro/Psych    GI/Hepatic negative GI ROS, Neg liver ROS,   Endo/Other  diabetes, Type 2  Renal/GU Renal disease (Kidney stones)     Musculoskeletal negative musculoskeletal ROS (+)   Abdominal Normal abdominal exam  (+)   Peds  Hematology negative hematology ROS (+)   Anesthesia Other Findings   Reproductive/Obstetrics                           Anesthesia Physical  Anesthesia Plan  ASA: II  Anesthesia Plan: General   Post-op Pain Management:    Induction: Intravenous  Airway Management Planned: LMA  Additional Equipment:   Intra-op Plan:   Post-operative Plan: Extubation in OR  Informed Consent: I have reviewed the patients History and Physical, chart, labs and discussed the procedure including the risks, benefits and alternatives for the proposed anesthesia with the patient or authorized representative who has indicated his/her understanding and acceptance.   Dental Advisory Given  Plan Discussed with: CRNA, Surgeon and Anesthesiologist  Anesthesia Plan Comments:        Anesthesia Quick Evaluation

## 2016-01-16 NOTE — Telephone Encounter (Signed)
-----   Message from Hildred Laser, MD sent at 01/16/2016 12:46 PM EST ----- Patient needs to follow up in the office next week for cystoscopy/stent removal in the office.

## 2016-01-16 NOTE — Op Note (Signed)
Date of procedure: 01/16/2016  Preoperative diagnosis:  1. Left ureteral stone   Postoperative diagnosis:  1. Left ureteral stone   Procedure: 1. Cystoscopy 2. Left ureteroscopy 3. Laser lithotripsy 4. Stone basketing 5. Left retrograde pyelogram with interpretation 6. Left ureteral stent exchange 6 [place] by 26 cm  Surgeon: Baruch Gouty, MD  Anesthesia: General  Complications: None  Intraoperative findings: There is a large burden of small calculi in the left ureter. Approximately 30-40 small fragments first pass with stone basket and removed. Left retropyelogram at the end the procedure showed no significant filling defects and the left ureteral stent correctly placed.  EBL: None  Specimens: Left ureteral stones  Drains: 6 French by 26 cm left double-J ureteral stent  Disposition: Stable to the postanesthesia care unit  Indication for procedure: The patient is a 50 y.o. male with history of a 2 cm left ureteral stone who underwent ESWL but eventually had to undergo ureteroscopy secondary to Steinstrasse. During his last fluoroscopy, all stone fragments were not able to be cleared due to his narrow ureter and ureteral trauma. He presents today for definitive stone management.  After reviewing the management options for treatment, the patient elected to proceed with the above surgical procedure(s). We have discussed the potential benefits and risks of the procedure, side effects of the proposed treatment, the likelihood of the patient achieving the goals of the procedure, and any potential problems that might occur during the procedure or recuperation. Informed consent has been obtained.  Description of procedure: The patient was met in the preoperative area. All risks, benefits, and indications of the procedure were described in great detail. The patient consented to the procedure. Preoperative antibiotics were given. The patient was taken to the operative theater. General  anesthesia was induced per the anesthesia service. The patient was then placed in the dorsal lithotomy position and prepped and draped in the usual sterile fashion. A preoperative timeout was called.    A 21 French 30 cystoscope was inserted into the patient's bladder per urethra atraumatically. The left renal stent was grasped with flexible graspers. It was removed to the level urethral meatus. A sensor wire was exchanged through the stent and the stent was removed. Fluoroscopy confirmed the sensor wire to be approximately in the left renal pelvis. Semirigid ureteroscope was assembled and inserted into the patient's bladder per urethra. The ureteral orifice on the level identified and intubated with the ureteroscope. Upon entering the distal ureter there was a large number of stone fragments. These were basketed with a stone basket and removed and dropped in the bladder. There are approximately 30-40 fragments at all. This took approximately one hour. A few of the fragments are too large for removal so they were broken into smaller pieces with laser lithotripsy first. After removal of all significant fragments, pan ureteroscopy was unremarkable for any large residual fragments. There was some mild trauma from the impacted stones, decision was made to leave a left ureteral stent. The ureteroscope was withdrawn after retrograde pyelogram was obtained that showed no significant filling defects consistent with residual stone burden. Again pan ureteroscopy showed no large stone fragments either. The cystoscope was then assembled over the sensor wire and inserted into the patient's bladder per urethra. A 6 French by 26 cm double-J ureteral stent was placed over the sensor wire, and the sensor wire removed. The stent was confirmed to be in the correct position with a curl seen in the patient's renal pelvis on fluoroscopy and a curl  seen in the patient's urinary bladder direct visualization. At this point all the stone  fragment remaining. The patient's bladder was drained and stone fragments sent to pathology. There were approximately 30-40 fragments in total. The patient Was awoken from anesthesia and transferred in stable condition to the post anesthesia care unit with no apparent complications.   Plan: The patient will follow-up in one week for stent removal. He will follow-up 30 days after that with a renal ultrasound prior to rule out iatrogenic hydronephrosis.  Baruch Gouty, M.D.

## 2016-01-16 NOTE — OR Nursing (Signed)
Patient belligerent and uncooperative.  Insisting on getting up from bed and refusing pain meds.  Does not want to pay for them.

## 2016-01-16 NOTE — Anesthesia Postprocedure Evaluation (Signed)
Anesthesia Post Note  Patient: Brian Butler  Procedure(s) Performed: Procedure(s) (LRB): URETEROSCOPY WITH HOLMIUM LASER LITHOTRIPSY (Left) CYSTOSCOPY WITH RETROGRADE PYELOGRAM (Left) CYSTOSCOPY WITH STENT PLACEMENT/ EXCHANGE (Left)  Patient location during evaluation: PACU Anesthesia Type: General Level of consciousness: awake and alert Pain management: pain level controlled Vital Signs Assessment: post-procedure vital signs reviewed and stable Respiratory status: spontaneous breathing, nonlabored ventilation, respiratory function stable and patient connected to nasal cannula oxygen Cardiovascular status: blood pressure returned to baseline and stable Postop Assessment: no signs of nausea or vomiting Anesthetic complications: no    Last Vitals:  Filed Vitals:   01/16/16 1327 01/16/16 1349  BP: 150/91 132/82  Pulse: 88 75  Temp: 35.6 C   Resp: 16 16    Last Pain:  Filed Vitals:   01/16/16 1350  PainSc: 0-No pain                 Lenard Simmer

## 2016-01-16 NOTE — H&P (View-Only) (Signed)
12/20/2015 9:54 AM   Brian Butler 1966/04/15 [id number]  Referring provider: Schuyler Amor, MD [address] [place], [place] [address]  Chief Complaint  Patient presents with  . Routine Post Op    ureteroscopy w/ holium laser lithotripsy nephrolithoasis     HPI: 50 year old male with a history of kidney stones presenting today status post ESWL for a 2 cm left ureteral calculus on 09/20/15. He did have some fragmentation and distal migration of the stone into the more distal portion of the ureter. He underwent repeat ESWL on 11/15/15 for the residual fragments and returns today for follow-up.  He has had increasing flank pain since the procedure and reports passing no fragments. He denies any fevers or chills. He has been taking narcotic pain medication on a regular basis since then.  KUB today shows fairly significant left distal ureteral steinstrasse up to 2 cm in length.   Interval history: The patient went ureteroscopy but not all stone for laser was removed due to the impacted stone causing tremor the ureter. The large lead stone of the Steinstrasse was removed so. The patient since has passed multiple stone fragments. He did not want to go and repeat ureteroscopy until he had a repeat x-ray. X-ray today does show still residual stone burden in the left distal ureter with the stent correctly placed.    PMH: Past Medical History  Diagnosis Date  . Diabetes mellitus without complication (HCC)   . Renal calculi   . Hypertension 09/05/2015  . Nephrolithiasis 09/05/2015  . FH: stroke 09/05/2015  . Hx of hypotestosteronemia 09/05/2015  . Hyperglycemia 09/05/2015  . Uncontrolled type 2 diabetes mellitus without complication (HCC) 09/05/2015    Surgical History: Past Surgical History  Procedure Laterality Date  . Cervical disc surgery  2006  . Extracorporeal shock wave lithotripsy Left 09/20/2015    Procedure: EXTRACORPOREAL SHOCK WAVE LITHOTRIPSY (ESWL);  Surgeon:  Hildred Laser, MD;  Location: [facility];  Service: Urology;  Laterality: Left;  . Extracorporeal shock wave lithotripsy Left 11/15/2015    Procedure: EXTRACORPOREAL SHOCK WAVE LITHOTRIPSY (ESWL);  Surgeon: Vanna Scotland, MD;  Location: [facility];  Service: Urology;  Laterality: Left;  . Ureteroscopy with holmium laser lithotripsy Left 12/05/2015    Procedure: URETEROSCOPY WITH HOLMIUM LASER LITHOTRIPSY;  Surgeon: Hildred Laser, MD;  Location: [facility];  Service: Urology;  Laterality: Left;  . Cystoscopy with stent placement Left 12/05/2015    Procedure: CYSTOSCOPY WITH STENT PLACEMENT;  Surgeon: Hildred Laser, MD;  Location: [facility];  Service: Urology;  Laterality: Left;    Home Medications:    Medication List       This list is accurate as of: 12/20/15  9:54 AM.  Always use your most recent med list.               cephALEXin 500 MG capsule  Commonly known as:  KEFLEX  Take 1 capsule (500 mg total) by mouth 3 (three) times daily.     lisinopril 10 MG tablet  Commonly known as:  PRINIVIL,ZESTRIL  Take 1 tablet (10 mg total) by mouth daily.     metFORMIN 500 MG tablet  Commonly known as:  GLUCOPHAGE  Take 1 tablet (500 mg total) by mouth 2 (two) times daily with a meal.     oxyCODONE-acetaminophen 5-325 MG tablet  Commonly known as:  PERCOCET  Take 1-2 tablets by mouth every 4 (four) hours as needed for moderate pain or severe pain.  tamsulosin 0.4 MG Caps capsule  Commonly known as:  FLOMAX  Take 1 capsule (0.4 mg total) by mouth daily.        Allergies: No Known Allergies  Family History: Family History  Problem Relation Age of Onset  . Stroke Father   . Nephrolithiasis Father   . Nephrolithiasis Father   . Prostate cancer Neg Hx   . Bladder Cancer Neg Hx     Social History:  reports that he has never smoked. He does not have any smokeless tobacco history on file. He reports that he does not drink alcohol or use illicit  drugs.  ROS: UROLOGY Frequent Urination?: No Hard to postpone urination?: No Burning/pain with urination?: No Get up at night to urinate?: Yes Leakage of urine?: No Urine stream starts and stops?: No Trouble starting stream?: No Do you have to strain to urinate?: No Blood in urine?: No Urinary tract infection?: No Sexually transmitted disease?: No Injury to kidneys or bladder?: No Painful intercourse?: No Weak stream?: No Erection problems?: No Penile pain?: No  Gastrointestinal Nausea?: No Vomiting?: No Indigestion/heartburn?: No Diarrhea?: No Constipation?: No  Constitutional Fever: No Night sweats?: No Weight loss?: No Fatigue?: No  Skin Skin rash/lesions?: No Itching?: No  Eyes Blurred vision?: No Double vision?: No  Ears/Nose/Throat Sore throat?: No Sinus problems?: No  Hematologic/Lymphatic Swollen glands?: No Easy bruising?: No  Cardiovascular Leg swelling?: No Chest pain?: No  Respiratory Cough?: No Shortness of breath?: No  Endocrine Excessive thirst?: No  Musculoskeletal Back pain?: No Joint pain?: No  Neurological Headaches?: No Dizziness?: No  Psychologic Depression?: No Anxiety?: No  Physical Exam: BP 149/74 mmHg  Pulse 58  Ht 6' (1.829 m)  Wt 214 lb 1.6 oz (97.115 kg)  BMI 29.03 kg/m2  Constitutional:  Alert and oriented, No acute distress. HEENT: [place] AT, moist mucus membranes.  Trachea midline, no masses. Cardiovascular: No clubbing, cyanosis, or edema. Respiratory: Normal respiratory effort, no increased work of breathing. GI: Abdomen is soft, nontender, nondistended, no abdominal masses GU: No CVA tenderness.  Skin: No rashes, bruises or suspicious lesions. Lymph: No cervical or inguinal adenopathy. Neurologic: Grossly intact, no focal deficits, moving all 4 extremities. Psychiatric: Normal mood and affect.  Laboratory Data: Lab Results  Component Value Date   WBC 5.5 12/04/2015   HGB 13.4 12/04/2015   HCT  39.8* 12/04/2015   MCV 85.4 12/04/2015   PLT 180 12/04/2015    Lab Results  Component Value Date   CREATININE 1.62* 12/04/2015    No results found for: PSA  Lab Results  Component Value Date   TESTOSTERONE 244* 09/05/2015    Lab Results  Component Value Date   HGBA1C 7.7* 09/05/2015    Urinalysis    Component Value Date/Time   COLORURINE YELLOW 09/08/2015 1304   COLORURINE Yellow 04/13/2013 1423   APPEARANCEUR CLEAR 09/08/2015 1304   APPEARANCEUR Hazy 04/13/2013 1423   LABSPEC 1.025 09/08/2015 1304   LABSPEC 1.037 04/13/2013 1423   PHURINE 6.0 09/08/2015 1304   PHURINE 5.0 04/13/2013 1423   GLUCOSEU 2+* 11/30/2015 0922   GLUCOSEU >=500 04/13/2013 1423   HGBUR NEGATIVE 09/08/2015 1304   HGBUR 3+ 04/13/2013 1423   BILIRUBINUR Negative 11/30/2015 0922   BILIRUBINUR NEGATIVE 09/08/2015 1304   BILIRUBINUR neg 09/05/2015 1139   BILIRUBINUR Negative 04/13/2013 1423   KETONESUR NEGATIVE 09/08/2015 1304   KETONESUR Negative 04/13/2013 1423   PROTEINUR NEGATIVE 09/08/2015 1304   PROTEINUR neg 09/05/2015 1139   PROTEINUR 30 mg/dL 04/13/2013 1423     UROBILINOGEN 0.2 09/05/2015 1139   NITRITE Negative 11/30/2015 0922   NITRITE NEGATIVE 09/08/2015 1304   NITRITE neg 09/05/2015 1139   NITRITE Negative 04/13/2013 1423   LEUKOCYTESUR Negative 11/30/2015 0922   LEUKOCYTESUR NEGATIVE 09/08/2015 1304   LEUKOCYTESUR Negative 04/13/2013 1423    Pertinent Imaging:  CLINICAL DATA: Recent lithotripsy. No pain today.  EXAM: ABDOMEN - 1 VIEW  COMPARISON: November 30, 2015  FINDINGS: No renal stones are identified. No right ureteral stones are noted. A left-sided ureteral stent is identified. The column of stones seen adjacent to the distal stent on the previous study are less prominent in the interval. However, I suspect multiple small stones remain. A phlebolith is seen in the left pelvis. No other interval changes or acute abnormalities.  IMPRESSION: Several  small stones remain around the distal left ureteral stent, less prominent in the interval.      Assessment & Plan:   1. Left ureteral stone -repeat cystoscopy, left ureteroscopy, laser lithotripsy, left ureteral stent exchange  No Follow-up on file.  Garret Teale James Latiana Tomei, MD  [facility] [address], [address] [place], [place] [address] [phone]   

## 2016-01-16 NOTE — Transfer of Care (Signed)
Immediate Anesthesia Transfer of Care Note  Patient: Brian Butler  Procedure(s) Performed: Procedure(s): URETEROSCOPY WITH HOLMIUM LASER LITHOTRIPSY (Left) CYSTOSCOPY WITH RETROGRADE PYELOGRAM (Left) CYSTOSCOPY WITH STENT PLACEMENT/ EXCHANGE (Left)  Patient Location: PACU  Anesthesia Type:General  Level of Consciousness: sedated  Airway & Oxygen Therapy: Patient Spontanous Breathing and Patient connected to nasal cannula oxygen  Post-op Assessment: Report given to RN and Post -op Vital signs reviewed and stable  Post vital signs: Reviewed and stable  Last Vitals:  Filed Vitals:   01/16/16 1110 01/16/16 1242  BP: 133/74 117/66  Pulse: 66 78  Temp: 36.9 C 36.7 C  Resp: 16 6    Complications: No apparent anesthesia complications

## 2016-01-16 NOTE — Discharge Instructions (Signed)

## 2016-01-16 NOTE — Interval H&P Note (Signed)
History and Physical Interval Note:  01/16/2016 9:35 AM  Brian Butler  has presented today for surgery, with the diagnosis of NEPHROLITHIASIS  The various methods of treatment have been discussed with the patient and family. After consideration of risks, benefits and other options for treatment, the patient has consented to  Procedure(s): URETEROSCOPY WITH HOLMIUM LASER LITHOTRIPSY (Left) CYSTOSCOPY WITH RETROGRADE PYELOGRAM (Left) CYSTOSCOPY WITH STENT PLACEMENT/ EXCHANGE (Left) as a surgical intervention .  The patient's history has been reviewed, patient examined, no change in status, stable for surgery.  I have reviewed the patient's chart and labs.  Questions were answered to the patient's satisfaction.     RRR Unlabored respirations  Cloyde Reams [place]

## 2016-01-16 NOTE — Anesthesia Procedure Notes (Signed)
Procedure Name: LMA Insertion Date/Time: 01/16/2016 11:35 AM Performed by: Shirlee Limerick, Aashritha Miedema Pre-anesthesia Checklist: Patient identified, Emergency Drugs available, Suction available and Patient being monitored Patient Re-evaluated:Patient Re-evaluated prior to inductionOxygen Delivery Method: Circle system utilized Preoxygenation: Pre-oxygenation with 100% oxygen Intubation Type: IV induction LMA: LMA inserted LMA Size: 4.5 Number of attempts: 1 Tube secured with: Tape Dental Injury: Teeth and Oropharynx as per pre-operative assessment

## 2016-01-17 ENCOUNTER — Encounter: Payer: Self-pay | Admitting: Urology

## 2016-01-17 NOTE — Telephone Encounter (Signed)
Patient already has an appointment schd   Thanks,  Marcelino Duster

## 2016-01-22 LAB — STONE ANALYSIS
CA OXALATE, MONOHYDR.: 80 %
CA PHOS CRY STONE QL IR: 5 %
STONE WEIGHT KSTONE: 469 mg
Uric Acid: 15 %

## 2016-01-24 ENCOUNTER — Encounter: Payer: Self-pay | Admitting: Urology

## 2016-01-24 ENCOUNTER — Ambulatory Visit (INDEPENDENT_AMBULATORY_CARE_PROVIDER_SITE_OTHER): Payer: 59 | Admitting: Urology

## 2016-01-24 VITALS — BP 122/75 | HR 92 | Ht 72.0 in | Wt 210.0 lb

## 2016-01-24 DIAGNOSIS — N200 Calculus of kidney: Secondary | ICD-10-CM

## 2016-01-24 LAB — URINALYSIS, COMPLETE
Bilirubin, UA: NEGATIVE
Ketones, UA: NEGATIVE
NITRITE UA: NEGATIVE
PH UA: 5.5 (ref 5.0–7.5)
Specific Gravity, UA: >1.03 — ABNORMAL HIGH (ref 1.005–1.030)
UUROB: 0.2 mg/dL (ref 0.2–1.0)

## 2016-01-24 LAB — MICROSCOPIC EXAMINATION: BACTERIA UA: NONE SEEN

## 2016-01-24 MED ORDER — CIPROFLOXACIN HCL 500 MG PO TABS
500.0000 mg | ORAL_TABLET | Freq: Once | ORAL | Status: AC
  Administered 2016-01-24: 500 mg via ORAL

## 2016-01-24 MED ORDER — LIDOCAINE HCL 2 % EX GEL
1.0000 "application " | Freq: Once | CUTANEOUS | Status: AC
  Administered 2016-01-24: 1 via URETHRAL

## 2016-01-24 NOTE — Progress Notes (Signed)
01/24/2016 8:38 AM   Brian Butler 01-16-1966 [id number]  Referring provider: Schuyler Amor, MD [address] [place], [place] [address]  Chief Complaint  Patient presents with  . Cysto Stent Removal    HPI: 50 year old male with a history of kidney stones presenting today status post ESWL for a 2 cm left ureteral calculus on 09/20/15. He did have some fragmentation and distal migration of the stone into the more distal portion of the ureter. He underwent repeat ESWL on 11/15/15 for the residual fragments and returns today for follow-up.  He has had increasing flank pain since the procedure and reports passing no fragments. He denies any fevers or chills. He has been taking narcotic pain medication on a regular basis since then.  KUB today shows fairly significant left distal ureteral steinstrasse up to 2 cm in length.   Interval history: The patient underwent second look ureteroscopy with removal of remaining stone fragments. He presents today for ureteral stent removal.   PMH: Past Medical History  Diagnosis Date  . Diabetes mellitus without complication (HCC)   . Renal calculi   . Hypertension 09/05/2015  . Nephrolithiasis 09/05/2015  . FH: stroke 09/05/2015  . Hx of hypotestosteronemia 09/05/2015  . Hyperglycemia 09/05/2015  . Uncontrolled type 2 diabetes mellitus without complication (HCC) 09/05/2015    Surgical History: Past Surgical History  Procedure Laterality Date  . Cervical disc surgery  2006  . Extracorporeal shock wave lithotripsy Left 09/20/2015    Procedure: EXTRACORPOREAL SHOCK WAVE LITHOTRIPSY (ESWL);  Surgeon: Hildred Laser, MD;  Location: [facility];  Service: Urology;  Laterality: Left;  . Extracorporeal shock wave lithotripsy Left 11/15/2015    Procedure: EXTRACORPOREAL SHOCK WAVE LITHOTRIPSY (ESWL);  Surgeon: Vanna Scotland, MD;  Location: [facility];  Service: Urology;  Laterality: Left;  . Ureteroscopy with holmium laser lithotripsy Left  12/05/2015    Procedure: URETEROSCOPY WITH HOLMIUM LASER LITHOTRIPSY;  Surgeon: Hildred Laser, MD;  Location: [facility];  Service: Urology;  Laterality: Left;  . Cystoscopy with stent placement Left 12/05/2015    Procedure: CYSTOSCOPY WITH STENT PLACEMENT;  Surgeon: Hildred Laser, MD;  Location: [facility];  Service: Urology;  Laterality: Left;  . Ureteroscopy with holmium laser lithotripsy Left 01/16/2016    Procedure: URETEROSCOPY WITH HOLMIUM LASER LITHOTRIPSY;  Surgeon: Hildred Laser, MD;  Location: [facility];  Service: Urology;  Laterality: Left;  . Cystoscopy w/ retrogrades Left 01/16/2016    Procedure: CYSTOSCOPY WITH RETROGRADE PYELOGRAM;  Surgeon: Hildred Laser, MD;  Location: [facility];  Service: Urology;  Laterality: Left;  . Cystoscopy with stent placement Left 01/16/2016    Procedure: CYSTOSCOPY WITH STENT PLACEMENT/ EXCHANGE;  Surgeon: Hildred Laser, MD;  Location: [facility];  Service: Urology;  Laterality: Left;    Home Medications:    Medication List       This list is accurate as of: 01/24/16  8:38 AM.  Always use your most recent med list.               HYDROcodone-acetaminophen 5-325 MG tablet  Commonly known as:  NORCO  Take 1 tablet by mouth every 6 (six) hours as needed for moderate pain.     lisinopril 10 MG tablet  Commonly known as:  PRINIVIL,ZESTRIL  Take 1 tablet (10 mg total) by mouth daily.     metFORMIN 500 MG tablet  Commonly known as:  GLUCOPHAGE  Take 1 tablet (500 mg total) by mouth 2 (two) times daily with  a meal.     tamsulosin 0.4 MG Caps capsule  Commonly known as:  FLOMAX  Take 1 capsule (0.4 mg total) by mouth daily.        Allergies: No Known Allergies  Family History: Family History  Problem Relation Age of Onset  . Stroke Father   . Nephrolithiasis Father   . Nephrolithiasis Father   . Prostate cancer Neg Hx   . Bladder Cancer Neg Hx     Social History:  reports that he has never smoked. He does not have any  smokeless tobacco history on file. He reports that he does not drink alcohol or use illicit drugs.  ROS:                                        Physical Exam: BP 122/75 mmHg  Pulse 92  Ht 6' (1.829 m)  Wt 210 lb (95.255 kg)  BMI 28.47 kg/m2  Constitutional:  Alert and oriented, No acute distress. HEENT: [place] AT, moist mucus membranes.  Trachea midline, no masses. Cardiovascular: No clubbing, cyanosis, or edema. Respiratory: Normal respiratory effort, no increased work of breathing. GI: Abdomen is soft, nontender, nondistended, no abdominal masses GU: No CVA tenderness.  Skin: No rashes, bruises or suspicious lesions. Lymph: No cervical or inguinal adenopathy. Neurologic: Grossly intact, no focal deficits, moving all 4 extremities. Psychiatric: Normal mood and affect.  Laboratory Data: Lab Results  Component Value Date   WBC 5.5 12/04/2015   HGB 13.4 12/04/2015   HCT 39.8* 12/04/2015   MCV 85.4 12/04/2015   PLT 180 12/04/2015    Lab Results  Component Value Date   CREATININE 1.62* 12/04/2015    No results found for: PSA  Lab Results  Component Value Date   TESTOSTERONE 244* 09/05/2015    Lab Results  Component Value Date   HGBA1C 7.7* 09/05/2015    Urinalysis    Component Value Date/Time   COLORURINE YELLOW 09/08/2015 1304   COLORURINE Yellow 04/13/2013 1423   APPEARANCEUR CLEAR 09/08/2015 1304   APPEARANCEUR Hazy 04/13/2013 1423   LABSPEC 1.025 09/08/2015 1304   LABSPEC 1.037 04/13/2013 1423   PHURINE 6.0 09/08/2015 1304   PHURINE 5.0 04/13/2013 1423   GLUCOSEU Negative 12/20/2015 0945   GLUCOSEU >=500 04/13/2013 1423   HGBUR NEGATIVE 09/08/2015 1304   HGBUR 3+ 04/13/2013 1423   BILIRUBINUR Negative 12/20/2015 0945   BILIRUBINUR NEGATIVE 09/08/2015 1304   BILIRUBINUR neg 09/05/2015 1139   BILIRUBINUR Negative 04/13/2013 1423   KETONESUR NEGATIVE 09/08/2015 1304   KETONESUR Negative 04/13/2013 1423   PROTEINUR NEGATIVE  09/08/2015 1304   PROTEINUR neg 09/05/2015 1139   PROTEINUR 30 mg/dL 19/14/7829 5621   UROBILINOGEN 0.2 09/05/2015 1139   NITRITE Negative 12/20/2015 0945   NITRITE NEGATIVE 09/08/2015 1304   NITRITE neg 09/05/2015 1139   NITRITE Negative 04/13/2013 1423   LEUKOCYTESUR 1+* 12/20/2015 0945   LEUKOCYTESUR NEGATIVE 09/08/2015 1304   LEUKOCYTESUR Negative 04/13/2013 1423    Cystoscopy Procedure Note  Patient identification was confirmed, informed consent was obtained, and patient was prepped using Betadine solution.  Lidocaine jelly was administered per urethral meatus.    Preoperative abx where received prior to procedure.     Pre-Procedure: - Inspection reveals a normal caliber ureteral meatus.  Procedure: The flexible cystoscope was introduced without difficulty - No urethral strictures/lesions are present. - Normal prostate  - Normal bladder neck  Left ureteral stent removed intact with flexible graspers   Post-Procedure: - Patient tolerated the procedure well    Assessment & Plan:    1. Left ureteral stone s/p ESWL/ureteroscopy -follow up in one month with renal ultrasound  Return in about 4 weeks (around 02/21/2016) for with renal ultrasound .  Hildred Laser, MD  [facility] [address] [place], [place] [address] [phone]

## 2016-01-25 ENCOUNTER — Telehealth: Payer: Self-pay

## 2016-01-25 DIAGNOSIS — N200 Calculus of kidney: Secondary | ICD-10-CM

## 2016-01-25 MED ORDER — HYDROCODONE-ACETAMINOPHEN 5-325 MG PO TABS: 1.0000 | ORAL_TABLET | ORAL | Status: DC | PRN

## 2016-01-25 NOTE — Telephone Encounter (Signed)
Pt called c/o severe left kidney pain, 9/10. Per Dr. Sherryl Barters pt can have norco 5/325 #30.  Spoke with pt and made aware. Pt voiced understanding. Script was printed, signed, and left up front for pt.

## 2016-01-28 ENCOUNTER — Other Ambulatory Visit: Payer: 59

## 2016-02-06 ENCOUNTER — Ambulatory Visit: Payer: 59 | Admitting: Podiatry

## 2016-02-11 ENCOUNTER — Encounter: Payer: Self-pay | Admitting: Podiatry

## 2016-02-11 ENCOUNTER — Ambulatory Visit (INDEPENDENT_AMBULATORY_CARE_PROVIDER_SITE_OTHER): Payer: 59 | Admitting: Podiatry

## 2016-02-11 DIAGNOSIS — M722 Plantar fascial fibromatosis: Secondary | ICD-10-CM | POA: Diagnosis not present

## 2016-02-11 NOTE — Progress Notes (Signed)
Subjective:     Patient ID: Brian Butler, male   DOB: September 11, 1966, 50 y.o.   MRN: [id number]  HPI patient states that my heel is still bothering me a significant amount and I am just worried about my diabetes and I had a friend lose his leg after having foot surgery   Review of Systems     Objective:   Physical Exam Neurovascular status intact muscle strength adequate with discomfort of a severe nature in the medial band of the plantar fascial left heel. Patient states his sugars been running better but he is concerned about    Assessment:     Continued acute plantar fasciitis left    Plan:     Discussed treatment options and I do think we could consider shockwave therapy at this time before we were to consider open surgery and he is in agreement that he wants to see his family physician first to checks his diabetes and see where he stands. He does have good circulatory status and I do think it would heal well from surgery if needed but we will try to do what we can do to hold off on it. Educated him on shockwave at this time

## 2016-02-14 ENCOUNTER — Ambulatory Visit: Payer: 59

## 2016-02-21 ENCOUNTER — Ambulatory Visit: Payer: 59 | Admitting: Urology

## 2016-03-17 DIAGNOSIS — M722 Plantar fascial fibromatosis: Secondary | ICD-10-CM

## 2016-04-21 ENCOUNTER — Encounter (HOSPITAL_COMMUNITY): Payer: Self-pay | Admitting: *Deleted

## 2016-04-21 ENCOUNTER — Observation Stay (HOSPITAL_COMMUNITY)
Admission: EM | Admit: 2016-04-21 | Discharge: 2016-04-23 | Disposition: A | Discharge status: Home / Self Care | Payer: 59 | Attending: Internal Medicine | Admitting: Internal Medicine

## 2016-04-21 ENCOUNTER — Ambulatory Visit (INDEPENDENT_AMBULATORY_CARE_PROVIDER_SITE_OTHER)
Admission: EM | Admit: 2016-04-21 | Discharge: 2016-04-21 | Disposition: A | Discharge status: Nursing Home (Includes ICF) | Payer: 59 | Source: Home / Self Care | Attending: Family Medicine | Admitting: Family Medicine

## 2016-04-21 ENCOUNTER — Encounter (HOSPITAL_COMMUNITY): Payer: Self-pay | Admitting: Emergency Medicine

## 2016-04-21 ENCOUNTER — Emergency Department (HOSPITAL_COMMUNITY): Payer: 59

## 2016-04-21 DIAGNOSIS — R0602 Shortness of breath: Secondary | ICD-10-CM | POA: Diagnosis not present

## 2016-04-21 DIAGNOSIS — E1122 Type 2 diabetes mellitus with diabetic chronic kidney disease: Secondary | ICD-10-CM | POA: Diagnosis not present

## 2016-04-21 DIAGNOSIS — I200 Unstable angina: Secondary | ICD-10-CM

## 2016-04-21 DIAGNOSIS — R931 Abnormal findings on diagnostic imaging of heart and coronary circulation: Secondary | ICD-10-CM

## 2016-04-21 DIAGNOSIS — I10 Essential (primary) hypertension: Secondary | ICD-10-CM

## 2016-04-21 DIAGNOSIS — R000 Tachycardia, unspecified: Secondary | ICD-10-CM

## 2016-04-21 DIAGNOSIS — R079 Chest pain, unspecified: Secondary | ICD-10-CM | POA: Diagnosis not present

## 2016-04-21 DIAGNOSIS — R002 Palpitations: Secondary | ICD-10-CM

## 2016-04-21 DIAGNOSIS — R072 Precordial pain: Principal | ICD-10-CM | POA: Insufficient documentation

## 2016-04-21 DIAGNOSIS — E785 Hyperlipidemia, unspecified: Secondary | ICD-10-CM | POA: Diagnosis not present

## 2016-04-21 DIAGNOSIS — I209 Angina pectoris, unspecified: Secondary | ICD-10-CM

## 2016-04-21 DIAGNOSIS — R0600 Dyspnea, unspecified: Secondary | ICD-10-CM | POA: Diagnosis not present

## 2016-04-21 DIAGNOSIS — I129 Hypertensive chronic kidney disease with stage 1 through stage 4 chronic kidney disease, or unspecified chronic kidney disease: Secondary | ICD-10-CM | POA: Diagnosis not present

## 2016-04-21 DIAGNOSIS — I2510 Atherosclerotic heart disease of native coronary artery without angina pectoris: Secondary | ICD-10-CM | POA: Diagnosis not present

## 2016-04-21 DIAGNOSIS — Z7984 Long term (current) use of oral hypoglycemic drugs: Secondary | ICD-10-CM | POA: Diagnosis not present

## 2016-04-21 DIAGNOSIS — E1165 Type 2 diabetes mellitus with hyperglycemia: Secondary | ICD-10-CM | POA: Diagnosis not present

## 2016-04-21 DIAGNOSIS — N183 Chronic kidney disease, stage 3 (moderate): Secondary | ICD-10-CM | POA: Diagnosis not present

## 2016-04-21 DIAGNOSIS — R0789 Other chest pain: Secondary | ICD-10-CM | POA: Diagnosis not present

## 2016-04-21 DIAGNOSIS — E119 Type 2 diabetes mellitus without complications: Secondary | ICD-10-CM | POA: Diagnosis present

## 2016-04-21 DIAGNOSIS — I110 Hypertensive heart disease with heart failure: Secondary | ICD-10-CM | POA: Diagnosis not present

## 2016-04-21 HISTORY — DX: Plantar fascial fibromatosis: M72.2

## 2016-04-21 HISTORY — DX: Chronic kidney disease, stage 3 unspecified: N18.30

## 2016-04-21 HISTORY — DX: Chronic kidney disease, stage 3 (moderate): N18.3

## 2016-04-21 LAB — BASIC METABOLIC PANEL
ANION GAP: 6 (ref 5–15)
BUN: 18 mg/dL (ref 6–20)
CALCIUM: 9 mg/dL (ref 8.9–10.3)
CO2: 25 mmol/L (ref 22–32)
CREATININE: 1.5 mg/dL — AB (ref 0.61–1.24)
Chloride: 105 mmol/L (ref 101–111)
GFR, EST NON AFRICAN AMERICAN: 53 mL/min — AB (ref >60)
Glucose, Bld: 128 mg/dL — ABNORMAL HIGH (ref 65–99)
Potassium: 3.8 mmol/L (ref 3.5–5.1)
SODIUM: 136 mmol/L (ref 135–145)

## 2016-04-21 LAB — CBC
HCT: 41.2 % (ref 39.0–52.0)
HEMOGLOBIN: 13.8 g/dL (ref 13.0–17.0)
MCH: 28.8 pg (ref 26.0–34.0)
MCHC: 33.5 g/dL (ref 30.0–36.0)
MCV: 86 fL (ref 78.0–100.0)
PLATELETS: 178 10*3/uL (ref 150–400)
RBC: 4.79 MIL/uL (ref 4.22–5.81)
RDW: 12.8 % (ref 11.5–15.5)
WBC: 7.6 10*3/uL (ref 4.0–10.5)

## 2016-04-21 LAB — TROPONIN I

## 2016-04-21 LAB — I-STAT TROPONIN, ED: TROPONIN I, POC: 0 ng/mL (ref 0.00–0.08)

## 2016-04-21 LAB — HEPARIN LEVEL (UNFRACTIONATED): HEPARIN UNFRACTIONATED: 0.33 [IU]/mL (ref 0.30–0.70)

## 2016-04-21 LAB — GLUCOSE, CAPILLARY: Glucose-Capillary: 104 mg/dL — ABNORMAL HIGH (ref 65–99)

## 2016-04-21 LAB — D-DIMER, QUANTITATIVE (NOT AT ARMC)

## 2016-04-21 MED ORDER — HYDROCODONE-ACETAMINOPHEN 5-325 MG PO TABS
1.0000 | ORAL_TABLET | Freq: Four times a day (QID) | ORAL | Status: DC | PRN
Start: 2016-04-21 — End: 2016-04-23

## 2016-04-21 MED ORDER — HEPARIN (PORCINE) IN NACL 100-0.45 UNIT/ML-% IJ SOLN
1400.0000 [IU]/h | INTRAMUSCULAR | Status: DC
  Administered 2016-04-21: 1200 [IU]/h via INTRAVENOUS
  Administered 2016-04-22 – 2016-04-23 (×2): 1300 [IU]/h via INTRAVENOUS
  Filled 2016-04-21 (×3): qty 250

## 2016-04-21 MED ORDER — NITROGLYCERIN 0.4 MG SL SUBL
SUBLINGUAL_TABLET | SUBLINGUAL | Status: AC
  Filled 2016-04-21: qty 1

## 2016-04-21 MED ORDER — ASPIRIN 81 MG PO CHEW
324.0000 mg | CHEWABLE_TABLET | Freq: Once | ORAL | Status: AC
  Administered 2016-04-21: 324 mg via ORAL

## 2016-04-21 MED ORDER — GI COCKTAIL ~~LOC~~: 30.0000 mL | Freq: Four times a day (QID) | ORAL | Status: DC | PRN

## 2016-04-21 MED ORDER — MORPHINE SULFATE (PF) 2 MG/ML IV SOLN: 2.0000 mg | INTRAVENOUS | Status: DC | PRN

## 2016-04-21 MED ORDER — HEPARIN BOLUS VIA INFUSION
4000.0000 [IU] | Freq: Once | INTRAVENOUS | Status: AC
  Administered 2016-04-21: 4000 [IU] via INTRAVENOUS
  Filled 2016-04-21: qty 4000

## 2016-04-21 MED ORDER — MORPHINE SULFATE (PF) 4 MG/ML IV SOLN
4.0000 mg | Freq: Once | INTRAVENOUS | Status: AC
Start: 2016-04-21 — End: 2016-04-21
  Administered 2016-04-21: 4 mg via INTRAVENOUS
  Filled 2016-04-21: qty 1

## 2016-04-21 MED ORDER — INSULIN ASPART 100 UNIT/ML ~~LOC~~ SOLN
0.0000 [IU] | Freq: Three times a day (TID) | SUBCUTANEOUS | Status: DC
  Administered 2016-04-23: 2 [IU] via SUBCUTANEOUS

## 2016-04-21 MED ORDER — HEPARIN (PORCINE) IN NACL 100-0.45 UNIT/ML-% IJ SOLN: 12.0000 [IU]/kg/h | INTRAMUSCULAR | Status: DC

## 2016-04-21 MED ORDER — ONDANSETRON HCL 4 MG/2ML IJ SOLN
4.0000 mg | Freq: Once | INTRAMUSCULAR | Status: AC
  Administered 2016-04-21: 4 mg via INTRAVENOUS
  Filled 2016-04-21: qty 2

## 2016-04-21 MED ORDER — ACETAMINOPHEN 325 MG PO TABS: 650.0000 mg | ORAL_TABLET | ORAL | Status: DC | PRN

## 2016-04-21 MED ORDER — HYDRALAZINE HCL 20 MG/ML IJ SOLN: 10.0000 mg | Freq: Four times a day (QID) | INTRAMUSCULAR | Status: DC | PRN

## 2016-04-21 MED ORDER — ONDANSETRON HCL 4 MG/2ML IJ SOLN: 4.0000 mg | Freq: Four times a day (QID) | INTRAMUSCULAR | Status: DC | PRN

## 2016-04-21 MED ORDER — SODIUM CHLORIDE 0.9 % IV SOLN
Freq: Once | INTRAVENOUS | Status: AC
  Administered 2016-04-21: 14:00:00 via INTRAVENOUS

## 2016-04-21 MED ORDER — INSULIN ASPART 100 UNIT/ML ~~LOC~~ SOLN: 0.0000 [IU] | Freq: Every day | SUBCUTANEOUS | Status: DC

## 2016-04-21 MED ORDER — ASPIRIN 81 MG PO CHEW
CHEWABLE_TABLET | ORAL | Status: AC
  Filled 2016-04-21: qty 4

## 2016-04-21 MED ORDER — NITROGLYCERIN 0.4 MG SL SUBL
0.4000 mg | SUBLINGUAL_TABLET | SUBLINGUAL | Status: DC | PRN
  Administered 2016-04-21: 0.4 mg via SUBLINGUAL

## 2016-04-21 MED ORDER — ASPIRIN EC 325 MG PO TBEC
325.0000 mg | DELAYED_RELEASE_TABLET | Freq: Every day | ORAL | Status: DC
  Filled 2016-04-21: qty 1

## 2016-04-21 NOTE — ED Provider Notes (Signed)
CSN: [id number]     Arrival date & time 04/21/16  1502 History   First MD Initiated Contact with Patient 04/21/16 [phone]     Chief Complaint  Patient presents with  . Chest Pain   HPI Comments: 50 year old male presents with chest pain starting at 9 AM this morning. Past medical history significant for hypertension and diabetes. He states sometimes he does have chest pain occassionally however he has never had it formally evaluated before and this time is has been more severe with SOB. He went to urgent care who referred him here to the ED for further evaluation. He states the chest pain is over the left side of his chest, intermittent, and non-radiating. Feels like a pressure. Reports associated diaphoresis, palpitations, shortness of breath. Denies fever, chills, syncope, leg swelling, hemoptysis, cough, wheezing abdominal pain, nausea, vomiting. EMS has given him 2 nitro and 1 full strength ASA with moderate relief of his pain and he was put on 2L O2. His pain has improved with this and is now a 5/10. Denies tobacco use, illegal drug use. Has never had a stress test.   Past Medical History  Diagnosis Date  . Diabetes mellitus without complication (HCC)   . Renal calculi   . Hypertension 09/05/2015  . Nephrolithiasis 09/05/2015  . FH: stroke 09/05/2015  . Hx of hypotestosteronemia 09/05/2015  . Hyperglycemia 09/05/2015  . Uncontrolled type 2 diabetes mellitus without complication (HCC) 09/05/2015   Past Surgical History  Procedure Laterality Date  . Cervical disc surgery  2006  . Extracorporeal shock wave lithotripsy Left 09/20/2015    Procedure: EXTRACORPOREAL SHOCK WAVE LITHOTRIPSY (ESWL);  Surgeon: Hildred Laser, MD;  Location: [facility];  Service: Urology;  Laterality: Left;  . Extracorporeal shock wave lithotripsy Left 11/15/2015    Procedure: EXTRACORPOREAL SHOCK WAVE LITHOTRIPSY (ESWL);  Surgeon: Vanna Scotland, MD;  Location: [facility];  Service: Urology;  Laterality: Left;  .  Ureteroscopy with holmium laser lithotripsy Left 12/05/2015    Procedure: URETEROSCOPY WITH HOLMIUM LASER LITHOTRIPSY;  Surgeon: Hildred Laser, MD;  Location: [facility];  Service: Urology;  Laterality: Left;  . Cystoscopy with stent placement Left 12/05/2015    Procedure: CYSTOSCOPY WITH STENT PLACEMENT;  Surgeon: Hildred Laser, MD;  Location: [facility];  Service: Urology;  Laterality: Left;  . Ureteroscopy with holmium laser lithotripsy Left 01/16/2016    Procedure: URETEROSCOPY WITH HOLMIUM LASER LITHOTRIPSY;  Surgeon: Hildred Laser, MD;  Location: [facility];  Service: Urology;  Laterality: Left;  . Cystoscopy w/ retrogrades Left 01/16/2016    Procedure: CYSTOSCOPY WITH RETROGRADE PYELOGRAM;  Surgeon: Hildred Laser, MD;  Location: [facility];  Service: Urology;  Laterality: Left;  . Cystoscopy with stent placement Left 01/16/2016    Procedure: CYSTOSCOPY WITH STENT PLACEMENT/ EXCHANGE;  Surgeon: Hildred Laser, MD;  Location: [facility];  Service: Urology;  Laterality: Left;   Family History  Problem Relation Age of Onset  . Stroke Father   . Nephrolithiasis Father   . Nephrolithiasis Father   . Prostate cancer Neg Hx   . Bladder Cancer Neg Hx    Social History  Substance Use Topics  . Smoking status: Never Smoker   . Smokeless tobacco: None  . Alcohol Use: No    Review of Systems  Constitutional: Positive for diaphoresis. Negative for fever and chills.  Respiratory: Positive for shortness of breath. Negative for cough and wheezing.   Cardiovascular: Positive for chest pain and palpitations. Negative for leg swelling.  Gastrointestinal:  Negative for nausea, vomiting, abdominal pain and diarrhea.  Neurological: Negative for syncope.  All other systems reviewed and are negative.   Allergies  Review of patient's allergies indicates no known allergies.  Home Medications   Prior to Admission medications   Medication Sig Start Date End Date Taking? Authorizing Provider   HYDROcodone-acetaminophen (NORCO) 5-325 MG tablet Take 1 tablet by mouth every 6 (six) hours as needed for moderate pain. 01/16/16   Hildred LaserBrian James Budzyn, MD  HYDROcodone-acetaminophen (NORCO) 5-325 MG tablet Take 1 tablet by mouth every 4 (four) hours as needed for moderate pain. 01/25/16   Hildred LaserBrian James Budzyn, MD  lisinopril (PRINIVIL,ZESTRIL) 10 MG tablet Take 1 tablet (10 mg total) by mouth daily. 12/18/15   Schuyler AmorWilliam Plonk, MD  metFORMIN (GLUCOPHAGE) 500 MG tablet Take 1 tablet (500 mg total) by mouth 2 (two) times daily with a meal. 11/22/15   Schuyler AmorWilliam Plonk, MD  tamsulosin (FLOMAX) 0.4 MG CAPS capsule Take 1 capsule (0.4 mg total) by mouth daily. Patient not taking: Reported on 01/24/2016 10/05/15   Hildred LaserBrian James Budzyn, MD   BP 156/84 mmHg  Pulse 104  Temp(Src) 99.4 F (37.4 C)  Resp 16  SpO2 99% Physical Exam  Constitutional: He is oriented to person, place, and time. He appears well-developed and well-nourished. No distress.  HENT:  Head: Normocephalic and atraumatic.  Eyes: Conjunctivae are normal. Pupils are equal, round, and reactive to light. Right eye exhibits no discharge. Left eye exhibits no discharge. No scleral icterus.  Neck: Normal range of motion.  Cardiovascular: Regular rhythm.  Tachycardia present.  Exam reveals no gallop and no friction rub.   No murmur heard. Pulmonary/Chest: Effort normal and breath sounds normal. No respiratory distress. He has no wheezes. He has no rales. He exhibits tenderness.  Tenderness to palpation of left side of chest wall  Abdominal: Soft. Bowel sounds are normal. He exhibits no distension and no mass. There is no tenderness. There is no rebound and no guarding.  Musculoskeletal: He exhibits no edema.  Neurological: He is alert and oriented to person, place, and time.  Skin: Skin is warm and dry. He is not diaphoretic. No pallor.  Psychiatric: His mood appears anxious.    ED Course  Procedures (including critical care time) Labs  Review Labs Reviewed  BASIC METABOLIC PANEL - Abnormal; Notable for the following:    Glucose, Bld 128 (*)    Creatinine, Ser 1.50 (*)    GFR calc non Af Amer 53 (*)    All other components within normal limits  CBC  D-DIMER, QUANTITATIVE (NOT AT [facility])  I-STAT TROPOININ, ED    Imaging Review No results found. I have personally reviewed and evaluated these images and lab results as part of my medical decision-making.   EKG Interpretation   Date/Time:  Monday Apr 21 2016 15:13:41 EDT Ventricular Rate:  98 PR Interval:  147 QRS Duration: 91 QT Interval:  336 QTC Calculation: 429 R Axis:   51 Text Interpretation:  Sinus rhythm Baseline wander in lead(s) V2 Since  last tracing abnormal ECG findings improved Confirmed by MILLER  MD, BRIAN  [phone]([id number]) on 04/21/2016 3:30:51 PM      MDM   Final diagnoses:  Chest pain, unspecified chest pain type   50 year old male presents with chest pain which has worsened since this morning. Arrival he is hypertensive and tachycardic into the 120s. He is afebrile, not tachypneic, not hypoxic. On physical exam he is in no acute distress having he is anxious.  Heart rate has improved he is not tachycardic. Lungs are CTA and abdomen is soft and nontender. CBC is unremarkable. BMP is remarkable for creatinine of 1.5. Troponin is 0 and EKG is normal sinus rhythm. D-dimer is normal. CXR shows mildly enlarged cardiac silhouette. Shared visit with Dr. Hyacinth Meeker. Will consult Dr. Katrinka Blazing for ACS r/o. Spoke with Dr. Benjamine Mola who will admit to hospitalist service.    Bethel Born, PA-C 04/21/16 1754  Eber Hong, MD 04/22/16 [phone]

## 2016-04-21 NOTE — ED Notes (Signed)
Patient complains of chest pain.  Patient points to center chest as location of pain.  Reports for 2 days he has had sensation of "heart irregular", having pauses and then a "hard beat".  This takes his breath away.  Patient is very anxious.  Describes pain as "pressure.Marland Kitchen.elephant on chest".  Patient had an episode of diaphoresis while driving to ucc.  Denies nausea, denies vomiting.  Reports sob

## 2016-04-21 NOTE — Consult Note (Signed)
Name: Brian Butler is a 50 y.o. male Admit date: 04/21/2016 Referring Physician:  Primary Physician:  Brian Butler, M.D. Primary Cardiologist:  Cherrie GauzeH WB Brian Butler, M.D.  Reason for Consultation:  Chest pain  ASSESSMENT: 1. Prolonged substernal chest pressure starting while walking radiating into the jaw and left arm, lasting greater than 90 minutes. 2. Recent history of palpitations and associated sharp chest discomfort different than the presenting complaint. 3. Diabetes mellitus, type II 4. Hyperlipidemia 5. Hypertension 6. Cervical and lumbar disc disease 7. Chronic kidney disease, stage Butler  PLAN: 1. Agree with placing in the hospital with serial cardiac markers to be done to rule out cardiac injury 2. If cardiac markers are negative, myocardial perfusion imaging with Lexiscan pharmacologic stress will be performed. 3. If cardiac markers are positive, will need coronary angiography 4. Aspirin 5. DVT prophylaxis with Lovenox or heparin 6. If recurrent chest pain, please perform EKG during discomfort 7. Nothing by mouth after midnight   HPI: [age] year old gentleman with diabetes mellitus, hypertension, hyperlipidemia, and family history of vascular disease who came to the emergency room after beginning to have substernal pressure with left arm radiation while walking out of an attorney's office earlier today. The discomfort felt as though someone was standing on his chest. The discomfort was severe for, 20-30 minutes and then gradually started to improve. During this time the patient drove phone, discussed his condition with his wife, and [facility]. He states that there was still mild residual chest discomfort upon arrival. EKG was nonischemic. He is now pain free.  PMH:   Past Medical History  Diagnosis Date  . Hypertension 09/05/2015  . Nephrolithiasis 09/05/2015  . FH: stroke 09/05/2015  . Hx of hypotestosteronemia 09/05/2015  .  Hyperglycemia 09/05/2015  . Uncontrolled type 2 diabetes mellitus without complication (HCC) 09/05/2015    PSH:   Past Surgical History  Procedure Laterality Date  . Cervical disc surgery  2006  . Extracorporeal shock wave lithotripsy Left 09/20/2015    Procedure: EXTRACORPOREAL SHOCK WAVE LITHOTRIPSY (ESWL);  Surgeon: Brian LaserBrian James Budzyn, MD;  Location: [facility];  Service: Urology;  Laterality: Left;  . Extracorporeal shock wave lithotripsy Left 11/15/2015    Procedure: EXTRACORPOREAL SHOCK WAVE LITHOTRIPSY (ESWL);  Surgeon: Brian ScotlandAshley Brandon, MD;  Location: [facility];  Service: Urology;  Laterality: Left;  . Ureteroscopy with holmium laser lithotripsy Left 12/05/2015    Procedure: URETEROSCOPY WITH HOLMIUM LASER LITHOTRIPSY;  Surgeon: Brian LaserBrian James Budzyn, MD;  Location: [facility];  Service: Urology;  Laterality: Left;  . Cystoscopy with stent placement Left 12/05/2015    Procedure: CYSTOSCOPY WITH STENT PLACEMENT;  Surgeon: Brian LaserBrian James Budzyn, MD;  Location: [facility];  Service: Urology;  Laterality: Left;  . Ureteroscopy with holmium laser lithotripsy Left 01/16/2016    Procedure: URETEROSCOPY WITH HOLMIUM LASER LITHOTRIPSY;  Surgeon: Brian LaserBrian James Budzyn, MD;  Location: [facility];  Service: Urology;  Laterality: Left;  . Cystoscopy w/ retrogrades Left 01/16/2016    Procedure: CYSTOSCOPY WITH RETROGRADE PYELOGRAM;  Surgeon: Brian LaserBrian James Budzyn, MD;  Location: [facility];  Service: Urology;  Laterality: Left;  . Cystoscopy with stent placement Left 01/16/2016    Procedure: CYSTOSCOPY WITH STENT PLACEMENT/ EXCHANGE;  Surgeon: Brian LaserBrian James Budzyn, MD;  Location: [facility];  Service: Urology;  Laterality: Left;   Allergies:  Review of patient's allergies indicates no known allergies. Prior to Admit Meds:   (Not in a hospital admission) Fam HX:    Family History  Problem Relation Age of Onset  . Stroke Father   . Nephrolithiasis Father   . Nephrolithiasis Father   . Prostate cancer Neg Hx   . Bladder Cancer Neg  Hx    Social HX:    Social History   Social History  . Marital Status: Married    Spouse Name: N/A  . Number of Children: N/A  . Years of Education: N/A   Occupational History  . Not on file.   Social History Main Topics  . Smoking status: Never Smoker   . Smokeless tobacco: Not on file  . Alcohol Use: No  . Drug Use: No  . Sexual Activity: Not on file   Other Topics Concern  . Not on file   Social History Narrative     Review of Systems: Chronic neck and back discomfort. Much emotional stress on the patient at the current time. Unable to work. All other systems are negative.  Physical Exam: Blood pressure 155/83, pulse 83, temperature 99.4 F (37.4 C), resp. rate 14, height 6' (1.829 m), weight 210 lb 1.6 oz (95.3 kg), SpO2 99 %. Weight change:    Lying relatively flat in bed and in no acute distress. Skin is warm and dry HEENT exam is unremarkable Neck exam reveals no JVD or carotid bruits Chest is clear anteriorly and posteriorly. Cardiac exam reveals no murmur, rub, click, or gallop Abdomen is soft. Bowel sounds are normal. Extremities reveal no edema. Peripheral pulses are 2+ and symmetric in upper and lower extremities. Neurological exam is unremarkable.  Labs: Lab Results  Component Value Date   WBC 7.6 04/21/2016   HGB 13.8 04/21/2016   HCT 41.2 04/21/2016   MCV 86.0 04/21/2016   PLT 178 04/21/2016    Recent Labs Lab 04/21/16 1518  NA 136  K 3.8  CL 105  CO2 25  BUN 18  CREATININE 1.50*  CALCIUM 9.0  GLUCOSE 128*   No results found for: PTT No results found for: INR, PROTIME No results found for: CKTOTAL, CKMB, CKMBINDEX, TROPONINI  No results found for: CHOL No results found for: HDL No results found for: LDLCALC No results found for: TRIG No results found for: CHOLHDL No results found for: LDLDIRECT    Radiology:  Dg Chest Port 1 View  04/21/2016  CLINICAL DATA:  Centralized chest pain and SOB since this morning. EXAM: PORTABLE  CHEST 1 VIEW COMPARISON:  None. FINDINGS: Cardiac silhouette upper normal to mildly enlarged. Vascular pattern normal. Lungs clear. IMPRESSION: Cardiac silhouette upper normal to mildly enlarged Electronically Signed   By: Esperanza Heir M.D.   On: 04/21/2016 15:37    EKG:  Sinus rhythm with no acute ST or T-wave changes noted    Lyn Records Butler 04/21/2016 6:00 PM

## 2016-04-21 NOTE — ED Provider Notes (Signed)
CSN: [id number]     Arrival date & time 04/21/16  1322 History   First MD Initiated Contact with Patient 04/21/16 1328     Chief Complaint  Patient presents with  . Chest Pain   (Consider location/radiation/quality/duration/timing/severity/associated sxs/prior Treatment) HPI Comments: 50 year old male presents to the urgent care complaining of hard rapid dictations in the past 2-3 days. Occasionally it will get worse. It occurs frequently and intermittently. Today he developed moderate to severe chest pain in the center of his anterior chest he describes it as an health at sitting on his chest. It is associated with shortness of breath. He also has DOE. He drove to the urgent care only advice of his PCP and on the way he developed some diaphoresis. No nausea or syncope. Medical history includes type 2 diabetes mellitus, hypertension, dyslipidemia and chronic musculoskeletal pain.   Past Medical History  Diagnosis Date  . Diabetes mellitus without complication (HCC)   . Renal calculi   . Hypertension 09/05/2015  . Nephrolithiasis 09/05/2015  . FH: stroke 09/05/2015  . Hx of hypotestosteronemia 09/05/2015  . Hyperglycemia 09/05/2015  . Uncontrolled type 2 diabetes mellitus without complication (HCC) 09/05/2015   Past Surgical History  Procedure Laterality Date  . Cervical disc surgery  2006  . Extracorporeal shock wave lithotripsy Left 09/20/2015    Procedure: EXTRACORPOREAL SHOCK WAVE LITHOTRIPSY (ESWL);  Surgeon: Hildred LaserBrian James Budzyn, MD;  Location: [facility];  Service: Urology;  Laterality: Left;  . Extracorporeal shock wave lithotripsy Left 11/15/2015    Procedure: EXTRACORPOREAL SHOCK WAVE LITHOTRIPSY (ESWL);  Surgeon: Vanna ScotlandAshley Brandon, MD;  Location: [facility];  Service: Urology;  Laterality: Left;  . Ureteroscopy with holmium laser lithotripsy Left 12/05/2015    Procedure: URETEROSCOPY WITH HOLMIUM LASER LITHOTRIPSY;  Surgeon: Hildred LaserBrian James Budzyn, MD;  Location: [facility];  Service: Urology;   Laterality: Left;  . Cystoscopy with stent placement Left 12/05/2015    Procedure: CYSTOSCOPY WITH STENT PLACEMENT;  Surgeon: Hildred LaserBrian James Budzyn, MD;  Location: [facility];  Service: Urology;  Laterality: Left;  . Ureteroscopy with holmium laser lithotripsy Left 01/16/2016    Procedure: URETEROSCOPY WITH HOLMIUM LASER LITHOTRIPSY;  Surgeon: Hildred LaserBrian James Budzyn, MD;  Location: [facility];  Service: Urology;  Laterality: Left;  . Cystoscopy w/ retrogrades Left 01/16/2016    Procedure: CYSTOSCOPY WITH RETROGRADE PYELOGRAM;  Surgeon: Hildred LaserBrian James Budzyn, MD;  Location: [facility];  Service: Urology;  Laterality: Left;  . Cystoscopy with stent placement Left 01/16/2016    Procedure: CYSTOSCOPY WITH STENT PLACEMENT/ EXCHANGE;  Surgeon: Hildred LaserBrian James Budzyn, MD;  Location: [facility];  Service: Urology;  Laterality: Left;   Family History  Problem Relation Age of Onset  . Stroke Father   . Nephrolithiasis Father   . Nephrolithiasis Father   . Prostate cancer Neg Hx   . Bladder Cancer Neg Hx    Social History  Substance Use Topics  . Smoking status: Never Smoker   . Smokeless tobacco: Not on file  . Alcohol Use: No    Review of Systems  Constitutional: Positive for activity change. Negative for fever and chills.  HENT: Negative.   Eyes: Negative.   Respiratory: Positive for shortness of breath.   Cardiovascular: Positive for chest pain and palpitations. Negative for leg swelling.  Gastrointestinal: Negative.   Musculoskeletal: Negative.   Skin: Negative.   Neurological: Positive for weakness.  Psychiatric/Behavioral: The patient is nervous/anxious.     Allergies  Review of patient's allergies indicates no known allergies.  Home Medications  Prior to Admission medications   Medication Sig Start Date End Date Taking? Authorizing Provider  HYDROcodone-acetaminophen (NORCO) 5-325 MG tablet Take 1 tablet by mouth every 6 (six) hours as needed for moderate pain. 01/16/16   Hildred Laser, MD   HYDROcodone-acetaminophen (NORCO) 5-325 MG tablet Take 1 tablet by mouth every 4 (four) hours as needed for moderate pain. 01/25/16   Hildred Laser, MD  lisinopril (PRINIVIL,ZESTRIL) 10 MG tablet Take 1 tablet (10 mg total) by mouth daily. 12/18/15   Schuyler Amor, MD  metFORMIN (GLUCOPHAGE) 500 MG tablet Take 1 tablet (500 mg total) by mouth 2 (two) times daily with a meal. 11/22/15   Schuyler Amor, MD  tamsulosin (FLOMAX) 0.4 MG CAPS capsule Take 1 capsule (0.4 mg total) by mouth daily. Patient not taking: Reported on 01/24/2016 10/05/15   Hildred Laser, MD   Meds Ordered and Administered this Visit   Medications  0.9 %  sodium chloride infusion (not administered)  nitroGLYCERIN (NITROSTAT) SL tablet 0.4 mg (not administered)  aspirin chewable tablet 324 mg (not administered)    BP 192/85 mmHg  Pulse 124  Temp(Src) 98.1 F (36.7 C) (Oral)  Resp 16  SpO2 95% No data found.   Physical Exam  Constitutional: He is oriented to person, place, and time. He appears well-developed and well-nourished.  HENT:  Mouth/Throat: Oropharynx is clear and moist.  Eyes: Conjunctivae and EOM are normal.  Neck: Normal range of motion. Neck supple.  Cardiovascular: Regular rhythm, normal heart sounds and intact distal pulses.   No murmur heard. Heart sounds bound and, S1 and S2 with an increased rate of 120 during the exam.  Pulmonary/Chest: Effort normal and breath sounds normal. He has no wheezes. He has no rales.  Abdominal: Soft. There is no tenderness.  Musculoskeletal: Normal range of motion. He exhibits no edema.  Neurological: He is alert and oriented to person, place, and time. No cranial nerve deficit. He exhibits normal muscle tone.  Skin: Skin is warm and dry. No rash noted.  Psychiatric:  Anxious, jittery mildly tremulous.  Nursing note and vitals reviewed.   ED Course  Procedures (including critical care time)  Labs Review Labs Reviewed - No data to display  Imaging  Review No results found. ED ECG REPORT   Date: 04/21/2016  Rate: 109  Rhythm: sinus tachycardia  QRS Axis: normal  Intervals: normal  ST/T Wave abnormalities: nonspecific ST changes  Conduction Disutrbances:none  Narrative Interpretation: Comparing the old EKG from 12/04/2015 the current EKG is sinus tachycardia at 109. There are nonspecific ST changes in the anterior leads with variable depressions of approximately a half a millimeter. No ectopy.  Old EKG Reviewed: changes noted  I have personally reviewed the EKG tracing and agree with the computerized printout as noted.   Visual Acuity Review  Right Eye Distance:   Left Eye Distance:   Bilateral Distance:    Right Eye Near:   Left Eye Near:    Bilateral Near:         MDM   1. Chest pain, unspecified chest pain type   2. Dyspnea   3. Heart palpitations   4. Tachycardia   5. Essential hypertension    Transfer to [facility] for evaluation of dictations, chest pain and dyspnea. Transfer by [facility] Link. Meds ordered this encounter  Medications  . 0.9 %  sodium chloride infusion    Sig:   . nitroGLYCERIN (NITROSTAT) SL tablet 0.4 mg    Sig:   .  aspirin chewable tablet 324 mg    Sig:   monitor, O2 at 2L/Min     Hayden Rasmussen, NP 04/21/16 1407

## 2016-04-21 NOTE — ED Notes (Signed)
Notified [facility] 

## 2016-04-21 NOTE — ED Provider Notes (Signed)
The patient is a 50 year old male, he presents to the hospital from the urgent care where he was sent because of his chest pain today. He reports that over the last several days he has had some intermittent palpitations where he has a discomfort in his chest ominous comes and goes and resolved spontaneously however today while he was walking down the street he developed a severe heaviness on his chest like a "elephant sitting on my chest". This is gradually improved and is almost resolved but he still has a slight symptom. He had been given nitroglycerin and aspirin and these things seem to help him improve as well. The pain did radiate to the left shoulder and arm and he was diaphoretic and nauseated when this occurred.  On exam the patient has clear heart and lung sounds, he is no longer tachycardic, he has no peripheral edema, he has a soft nontender abdomen, nontender chest.  His EKG showed a mild sinus tachycardia without acute ST elevations or depressions. Troponin was normal, d-dimer was normal, the patient will need cardiology evaluation.  I discussed the case with Dr. Katrinka BlazingSmith of cardiology who has graciously agreed to provide consultation, I have started the patient on heparin as this does sound like unstable angina and acute coronary syndrome. He will be admitted by the hospitalist service  CRITICAL CARE Performed by: Vida RollerBrian D Gillian Kluever Total critical care time: 35 minutes Critical care time was exclusive of separately billable procedures and treating other patients. Critical care was necessary to treat or prevent imminent or life-threatening deterioration. Critical care was time spent personally by me on the following activities: development of treatment plan with patient and/or surrogate as well as nursing, discussions with consultants, evaluation of patient's response to treatment, examination of patient, obtaining history from patient or surrogate, ordering and performing treatments and  interventions, ordering and review of laboratory studies, ordering and review of radiographic studies, pulse oximetry and re-evaluation of patient's condition.   Medications  heparin ADULT infusion 100 units/mL (25000 units/23550mL sodium chloride 0.45%) (1,300 Units/hr Intravenous New Bag/Given 04/22/16 1112)  HYDROcodone-acetaminophen (NORCO/VICODIN) 5-325 MG per tablet 1 tablet (not administered)  acetaminophen (TYLENOL) tablet 650 mg (not administered)  ondansetron (ZOFRAN) injection 4 mg (not administered)  morphine 2 MG/ML injection 2 mg (not administered)  gi cocktail (Maalox,Lidocaine,Donnatal) (not administered)  aspirin EC tablet 325 mg (325 mg Oral Not Given 04/22/16 1111)  insulin aspart (novoLOG) injection 0-15 Units (0 Units Subcutaneous Not Given 04/22/16 0700)  insulin aspart (novoLOG) injection 0-5 Units (0 Units Subcutaneous Not Given 04/21/16 2200)  hydrALAZINE (APRESOLINE) injection 10 mg (not administered)  morphine 4 MG/ML injection 4 mg (4 mg Intravenous Given 04/21/16 1534)  ondansetron (ZOFRAN) injection 4 mg (4 mg Intravenous Given 04/21/16 1534)  heparin bolus via infusion 4,000 Units (4,000 Units Intravenous Given 04/21/16 1741)  technetium tetrofosmin (TC-MYOVIEW) injection 10 milli Curie (10 milli Curies Intravenous Contrast Given 04/22/16 0825)  regadenoson (LEXISCAN) injection SOLN 0.4 mg (0.4 mg Intravenous Given 04/22/16 0953)  technetium tetrofosmin (TC-MYOVIEW) injection 30 milli Curie (30 milli Curies Intravenous Contrast Given 04/22/16 1000)      EKG Interpretation  Date/Time:  Monday Apr 21 2016 15:13:41 EDT Ventricular Rate:  98 PR Interval:  147 QRS Duration: 91 QT Interval:  336 QTC Calculation: 429 R Axis:   51 Text Interpretation:  Sinus rhythm Baseline wander in lead(s) V2 Since last tracing abnormal ECG findings improved Confirmed by Lorel Lembo  MD, Jamieka Royle ([id number]) on 04/21/2016 3:30:51 PM  Medical screening examination/treatment/procedure(s) were  conducted as a shared visit with non-physician practitioner(s) and myself.  I personally evaluated the patient during the encounter.  Clinical Impression:   Final diagnoses:  Chest pain, unspecified chest pain type  Unstable angina (HCC)         Eber Hong, MD 04/22/16 [phone]

## 2016-04-21 NOTE — ED Notes (Signed)
This nurse not notified of patient, complaint, or vitals

## 2016-04-21 NOTE — ED Notes (Signed)
Attempted report 

## 2016-04-21 NOTE — Progress Notes (Signed)
ANTICOAGULATION CONSULT NOTE - Initial Consult  Pharmacy Consult for heparin Indication: chest pain/ACS  No Known Allergies  Patient Measurements: Height: 6' (182.9 cm) Weight: 210 lb 1.6 oz (95.3 kg) IBW/kg (Calculated) : 77.6 Heparin Dosing Weight: 95kg  Vital Signs: Temp: 99.4 F (37.4 C) (05/22 1512) Temp Source: Oral (05/22 1328) BP: 155/83 mmHg (05/22 1715) Pulse Rate: 83 (05/22 1715)  Labs:  Recent Labs  04/21/16 1518  HGB 13.8  HCT 41.2  PLT 178  CREATININE 1.50*    Estimated Creatinine Clearance: 71.4 mL/min (by C-G formula based on Cr of 1.5).   Medical History: Past Medical History  Diagnosis Date  . Hypertension 09/05/2015  . Nephrolithiasis 09/05/2015  . FH: stroke 09/05/2015  . Hx of hypotestosteronemia 09/05/2015  . Hyperglycemia 09/05/2015  . Uncontrolled type 2 diabetes mellitus without complication (HCC) 09/05/2015    Medications:  Infusions:  . heparin 1,200 Units/hr (04/21/16 1737)    Assessment: 50 yom presented to the ED with CP. To start IV heparin. Baseline CBC is WNL and he is not on anticoagulation PTA.   Goal of Therapy:  Heparin level 0.3-0.7 units/ml Monitor platelets by anticoagulation protocol: Yes   Plan:  - Heparin bolus 4000 units IV x 1 - Heparin gtt 1200 units/hr - Check a 6 hour heparin level - Daily heparin level and CBC  Aleksander Edmiston, Drake Leachachel Lynn 04/21/2016,5:51 PM

## 2016-04-21 NOTE — ED Notes (Signed)
Pt was sent by [facility]. 2 days of chest pain and pressure. Abnormal p waves. 2 nitro and 324mg  asprin. 18g LAC.. 7/10pain after medications.

## 2016-04-21 NOTE — H&P (Signed)
History and Physical    Brian Butler ZOX:[id number] DOB: Apr 20, 1966 DOA: 04/21/2016  Referring MD/NP/PA: ER PCP: Schuyler Amor, MD Outpatient Specialists:  Patient coming from: home  Chief Complaint: chest pain  HPI: Brian Butler is a 50 y.o. male with medical history significant of HTN and DM.  He began having chest pain about 9 this AM.  Has had in the past but not as severe.  And this time accompanied by SOB.  He intially present to an urgent care who then sent him to the ER.    CP start while he was walking but did not get better with rest +family hx +SOB Pain was controlled with morphine and nitro  ED Course: EKG and troponin were ok in ER, story was concerning for ischemia so cardiology was consulted and hospitalist were asked to observe D-dimer was also negative   Review of Systems: all systems reviewed, negative unless stated above in HPI   Past Medical History  Diagnosis Date  . Hypertension 09/05/2015  . Nephrolithiasis 09/05/2015  . FH: stroke 09/05/2015  . Hx of hypotestosteronemia 09/05/2015  . Hyperglycemia 09/05/2015  . Uncontrolled type 2 diabetes mellitus without complication (HCC) 09/05/2015    Past Surgical History  Procedure Laterality Date  . Cervical disc surgery  2006  . Extracorporeal shock wave lithotripsy Left 09/20/2015    Procedure: EXTRACORPOREAL SHOCK WAVE LITHOTRIPSY (ESWL);  Surgeon: Hildred Laser, MD;  Location: [facility];  Service: Urology;  Laterality: Left;  . Extracorporeal shock wave lithotripsy Left 11/15/2015    Procedure: EXTRACORPOREAL SHOCK WAVE LITHOTRIPSY (ESWL);  Surgeon: Vanna Scotland, MD;  Location: [facility];  Service: Urology;  Laterality: Left;  . Ureteroscopy with holmium laser lithotripsy Left 12/05/2015    Procedure: URETEROSCOPY WITH HOLMIUM LASER LITHOTRIPSY;  Surgeon: Hildred Laser, MD;  Location: [facility];  Service: Urology;  Laterality: Left;  . Cystoscopy with stent placement Left 12/05/2015    Procedure:  CYSTOSCOPY WITH STENT PLACEMENT;  Surgeon: Hildred Laser, MD;  Location: [facility];  Service: Urology;  Laterality: Left;  . Ureteroscopy with holmium laser lithotripsy Left 01/16/2016    Procedure: URETEROSCOPY WITH HOLMIUM LASER LITHOTRIPSY;  Surgeon: Hildred Laser, MD;  Location: [facility];  Service: Urology;  Laterality: Left;  . Cystoscopy w/ retrogrades Left 01/16/2016    Procedure: CYSTOSCOPY WITH RETROGRADE PYELOGRAM;  Surgeon: Hildred Laser, MD;  Location: [facility];  Service: Urology;  Laterality: Left;  . Cystoscopy with stent placement Left 01/16/2016    Procedure: CYSTOSCOPY WITH STENT PLACEMENT/ EXCHANGE;  Surgeon: Hildred Laser, MD;  Location: [facility];  Service: Urology;  Laterality: Left;     reports that he has never smoked. He does not have any smokeless tobacco history on file. He reports that he does not drink alcohol or use illicit drugs.  No Known Allergies  Family History  Problem Relation Age of Onset  . Stroke Father   . Nephrolithiasis Father   . Nephrolithiasis Father   . Prostate cancer Neg Hx   . Bladder Cancer Neg Hx      Prior to Admission medications   Medication Sig Start Date End Date Taking? Authorizing Provider  lisinopril (PRINIVIL,ZESTRIL) 10 MG tablet Take 1 tablet (10 mg total) by mouth daily. 12/18/15  Yes Schuyler Amor, MD  metFORMIN (GLUCOPHAGE) 500 MG tablet Take 1 tablet (500 mg total) by mouth 2 (two) times daily with a meal. 11/22/15  Yes Schuyler Amor, MD  HYDROcodone-acetaminophen (NORCO) 5-325 MG tablet Take  1 tablet by mouth every 6 (six) hours as needed for moderate pain. 01/16/16   Hildred LaserBrian James Budzyn, MD  HYDROcodone-acetaminophen (NORCO) 5-325 MG tablet Take 1 tablet by mouth every 4 (four) hours as needed for moderate pain. 01/25/16   Hildred LaserBrian James Budzyn, MD  tamsulosin (FLOMAX) 0.4 MG CAPS capsule Take 1 capsule (0.4 mg total) by mouth daily. Patient not taking: Reported on 01/24/2016 10/05/15   Hildred LaserBrian James Budzyn, MD     Physical Exam: Filed Vitals:   04/21/16 1615 04/21/16 1648 04/21/16 1700 04/21/16 1715  BP: 135/94 171/89 148/77 155/83  Pulse: 86 72 75 83  Temp:      Resp: 18 11 15 14   Height:   6' (1.829 m)   Weight:   95.3 kg (210 lb 1.6 oz)   SpO2: 99% 99% 100% 99%      Constitutional: NAD, calm, comfortable Filed Vitals:   04/21/16 1615 04/21/16 1648 04/21/16 1700 04/21/16 1715  BP: 135/94 171/89 148/77 155/83  Pulse: 86 72 75 83  Temp:      Resp: 18 11 15 14   Height:   6' (1.829 m)   Weight:   95.3 kg (210 lb 1.6 oz)   SpO2: 99% 99% 100% 99%   Eyes: PERRL, lids and conjunctivae normal ENMT: Mucous membranes are moist. Posterior pharynx clear of any exudate or lesions.Normal dentition.  Neck: normal, supple, no masses, no thyromegaly Respiratory: clear to auscultation bilaterally, no wheezing, no crackles. Normal respiratory effort. No accessory muscle use.  Cardiovascular: Regular rate and rhythm, no murmurs / rubs / gallops. No extremity edema. 2+ pedal pulses. No carotid bruits.  Abdomen: no tenderness, no masses palpated. No hepatosplenomegaly. Bowel sounds positive.  Musculoskeletal: no clubbing / cyanosis. No joint deformity upper and lower extremities. Good ROM, no contractures. Normal muscle tone.  Skin: no rashes, lesions, ulcers. No induration Neurologic: CN 2-12 grossly intact. Sensation intact, DTR normal. Strength 5/5 in all 4.  Psychiatric: Normal judgment and insight. Alert and oriented x 3. Normal mood.    Labs on Admission: I have personally reviewed following labs and imaging studies  CBC:  Recent Labs Lab 04/21/16 1518  WBC 7.6  HGB 13.8  HCT 41.2  MCV 86.0  PLT 178   Basic Metabolic Panel:  Recent Labs Lab 04/21/16 1518  NA 136  K 3.8  CL 105  CO2 25  GLUCOSE 128*  BUN 18  CREATININE 1.50*  CALCIUM 9.0   GFR: Estimated Creatinine Clearance: 71.4 mL/min (by C-G formula based on Cr of 1.5). Liver Function Tests: No results for input(s):  AST, ALT, ALKPHOS, BILITOT, PROT, ALBUMIN in the last 168 hours. No results for input(s): LIPASE, AMYLASE in the last 168 hours. No results for input(s): AMMONIA in the last 168 hours. Coagulation Profile: No results for input(s): INR, PROTIME in the last 168 hours. Cardiac Enzymes: No results for input(s): CKTOTAL, CKMB, CKMBINDEX, TROPONINI in the last 168 hours. BNP (last 3 results) No results for input(s): PROBNP in the last 8760 hours. HbA1C: No results for input(s): HGBA1C in the last 72 hours. CBG: No results for input(s): GLUCAP in the last 168 hours. Lipid Profile: No results for input(s): CHOL, HDL, LDLCALC, TRIG, CHOLHDL, LDLDIRECT in the last 72 hours. Thyroid Function Tests: No results for input(s): TSH, T4TOTAL, FREET4, T3FREE, THYROIDAB in the last 72 hours. Anemia Panel: No results for input(s): VITAMINB12, FOLATE, FERRITIN, TIBC, IRON, RETICCTPCT in the last 72 hours. Urine analysis:    Component Value Date/Time  COLORURINE YELLOW 09/08/2015 1304   COLORURINE Yellow 04/13/2013 1423   APPEARANCEUR Hazy* 01/24/2016 0831   APPEARANCEUR CLEAR 09/08/2015 1304   APPEARANCEUR Hazy 04/13/2013 1423   LABSPEC 1.025 09/08/2015 1304   LABSPEC 1.037 04/13/2013 1423   PHURINE 6.0 09/08/2015 1304   PHURINE 5.0 04/13/2013 1423   GLUCOSEU 1+* 01/24/2016 0831   GLUCOSEU >=500 04/13/2013 1423   HGBUR NEGATIVE 09/08/2015 1304   HGBUR 3+ 04/13/2013 1423   BILIRUBINUR Negative 01/24/2016 0831   BILIRUBINUR NEGATIVE 09/08/2015 1304   BILIRUBINUR neg 09/05/2015 1139   BILIRUBINUR Negative 04/13/2013 1423   KETONESUR NEGATIVE 09/08/2015 1304   KETONESUR Negative 04/13/2013 1423   PROTEINUR 3+* 01/24/2016 0831   PROTEINUR NEGATIVE 09/08/2015 1304   PROTEINUR neg 09/05/2015 1139   PROTEINUR 30 mg/dL 16/09/9603 5409   UROBILINOGEN 0.2 09/05/2015 1139   NITRITE Negative 01/24/2016 0831   NITRITE NEGATIVE 09/08/2015 1304   NITRITE neg 09/05/2015 1139   NITRITE Negative 04/13/2013  1423   LEUKOCYTESUR Trace* 01/24/2016 0831   LEUKOCYTESUR NEGATIVE 09/08/2015 1304   LEUKOCYTESUR Negative 04/13/2013 1423   Sepsis Labs: Invalid input(s): PROCALCITONIN, LACTICIDVEN No results found for this or any previous visit (from the past 240 hour(s)).   Radiological Exams on Admission: Dg Chest Port 1 View  04/21/2016  CLINICAL DATA:  Centralized chest pain and SOB since this morning. EXAM: PORTABLE CHEST 1 VIEW COMPARISON:  None. FINDINGS: Cardiac silhouette upper normal to mildly enlarged. Vascular pattern normal. Lungs clear. IMPRESSION: Cardiac silhouette upper normal to mildly enlarged Electronically Signed   By: Esperanza Heir M.D.   On: 04/21/2016 15:37    EKG: Independently reviewed. Sinus, no ST changes  Assessment/Plan Active Problems:   Hypertension   Uncontrolled type 2 diabetes mellitus without complication (HCC)   Chest pain   Chest pain -cycle CE -heparin gtt -if CE negative plan for nuclear ST in AM- NPO after midnight -appreciate cardiology consult -ASA  HTN -hold ACE PRN hydralazine  DM -HgbA1C -SSI  HLD -FLP in AM  DVT prophylaxis: heparin gtt Code Status: full Family Communication: patient at bedside Disposition Plan: 24 hour admission unless + enzymes Consults called: Dr. Katrinka Blazing- cards Admission status: chest pain obs   JESSICA U VANN DO [facility] Pager [phone]  If 7PM-7AM, please contact night-coverage [email or web address] Password [facility]  04/21/2016, 5:54 PM

## 2016-04-21 NOTE — ED Notes (Signed)
Attempted report x 2 

## 2016-04-22 ENCOUNTER — Observation Stay (HOSPITAL_COMMUNITY): Payer: 59

## 2016-04-22 ENCOUNTER — Observation Stay (HOSPITAL_BASED_OUTPATIENT_CLINIC_OR_DEPARTMENT_OTHER): Payer: 59

## 2016-04-22 DIAGNOSIS — I200 Unstable angina: Secondary | ICD-10-CM | POA: Insufficient documentation

## 2016-04-22 DIAGNOSIS — N183 Chronic kidney disease, stage 3 (moderate): Secondary | ICD-10-CM | POA: Diagnosis not present

## 2016-04-22 DIAGNOSIS — I209 Angina pectoris, unspecified: Secondary | ICD-10-CM | POA: Diagnosis not present

## 2016-04-22 DIAGNOSIS — I10 Essential (primary) hypertension: Secondary | ICD-10-CM | POA: Diagnosis not present

## 2016-04-22 DIAGNOSIS — E1165 Type 2 diabetes mellitus with hyperglycemia: Secondary | ICD-10-CM | POA: Diagnosis not present

## 2016-04-22 DIAGNOSIS — R079 Chest pain, unspecified: Secondary | ICD-10-CM | POA: Diagnosis not present

## 2016-04-22 DIAGNOSIS — R072 Precordial pain: Secondary | ICD-10-CM | POA: Diagnosis not present

## 2016-04-22 DIAGNOSIS — E785 Hyperlipidemia, unspecified: Secondary | ICD-10-CM | POA: Diagnosis not present

## 2016-04-22 DIAGNOSIS — E1122 Type 2 diabetes mellitus with diabetic chronic kidney disease: Secondary | ICD-10-CM | POA: Diagnosis not present

## 2016-04-22 DIAGNOSIS — I2510 Atherosclerotic heart disease of native coronary artery without angina pectoris: Secondary | ICD-10-CM | POA: Diagnosis not present

## 2016-04-22 DIAGNOSIS — Z7984 Long term (current) use of oral hypoglycemic drugs: Secondary | ICD-10-CM | POA: Diagnosis not present

## 2016-04-22 DIAGNOSIS — I129 Hypertensive chronic kidney disease with stage 1 through stage 4 chronic kidney disease, or unspecified chronic kidney disease: Secondary | ICD-10-CM | POA: Diagnosis not present

## 2016-04-22 LAB — LIPID PANEL
CHOLESTEROL: 199 mg/dL (ref 0–200)
HDL: 44 mg/dL (ref >40)
LDL CALC: 99 mg/dL (ref 0–99)
TRIGLYCERIDES: 278 mg/dL — AB (ref <150)
Total CHOL/HDL Ratio: 4.5 RATIO
VLDL: 56 mg/dL — AB (ref 0–40)

## 2016-04-22 LAB — BASIC METABOLIC PANEL
ANION GAP: 6 (ref 5–15)
BUN: 15 mg/dL (ref 6–20)
CALCIUM: 8.4 mg/dL — AB (ref 8.9–10.3)
CO2: 27 mmol/L (ref 22–32)
Chloride: 104 mmol/L (ref 101–111)
Creatinine, Ser: 1.41 mg/dL — ABNORMAL HIGH (ref 0.61–1.24)
GFR, EST NON AFRICAN AMERICAN: 57 mL/min — AB (ref >60)
GLUCOSE: 120 mg/dL — AB (ref 65–99)
Potassium: 3.5 mmol/L (ref 3.5–5.1)
SODIUM: 137 mmol/L (ref 135–145)

## 2016-04-22 LAB — GLUCOSE, CAPILLARY
GLUCOSE-CAPILLARY: 178 mg/dL — AB (ref 65–99)
GLUCOSE-CAPILLARY: 124 mg/dL — AB (ref 65–99)
Glucose-Capillary: 112 mg/dL — ABNORMAL HIGH (ref 65–99)
Glucose-Capillary: 114 mg/dL — ABNORMAL HIGH (ref 65–99)

## 2016-04-22 LAB — CBC
HCT: 40.1 % (ref 39.0–52.0)
HEMOGLOBIN: 13.1 g/dL (ref 13.0–17.0)
MCH: 28.5 pg (ref 26.0–34.0)
MCHC: 32.7 g/dL (ref 30.0–36.0)
MCV: 87.4 fL (ref 78.0–100.0)
Platelets: 169 10*3/uL (ref 150–400)
RBC: 4.59 MIL/uL (ref 4.22–5.81)
RDW: 13 % (ref 11.5–15.5)
WBC: 8.1 10*3/uL (ref 4.0–10.5)

## 2016-04-22 LAB — NM MYOCAR MULTI W/SPECT W/WALL MOTION / EF
CHL CUP NUCLEAR SDS: 4
CHL CUP NUCLEAR SRS: 7
CHL CUP NUCLEAR SSS: 11
CSEPHR: 71 %
Estimated workload: 1 METS
Exercise duration (min): 0 min
Exercise duration (sec): 0 s
LHR: 0.03
LV dias vol: 112 mL (ref 62–150)
LV sys vol: 38 mL
MPHR: 171 {beats}/min
Peak HR: 122 {beats}/min
RPE: 0
Rest HR: 59 {beats}/min
TID: 1.18

## 2016-04-22 LAB — TROPONIN I

## 2016-04-22 MED ORDER — SODIUM CHLORIDE 0.9 % IV SOLN: 250.0000 mL | INTRAVENOUS | Status: DC | PRN

## 2016-04-22 MED ORDER — TECHNETIUM TC 99M TETROFOSMIN IV KIT
10.0000 | PACK | Freq: Once | INTRAVENOUS | Status: AC | PRN
  Administered 2016-04-22: 10 via INTRAVENOUS

## 2016-04-22 MED ORDER — REGADENOSON 0.4 MG/5ML IV SOLN
INTRAVENOUS | Status: AC
  Administered 2016-04-22: 0.4 mg via INTRAVENOUS
  Filled 2016-04-22: qty 5

## 2016-04-22 MED ORDER — SODIUM CHLORIDE 0.9 % IV SOLN
INTRAVENOUS | Status: DC
  Administered 2016-04-22: 19:00:00 via INTRAVENOUS

## 2016-04-22 MED ORDER — SODIUM CHLORIDE 0.9% FLUSH: 3.0000 mL | Freq: Two times a day (BID) | INTRAVENOUS | Status: DC

## 2016-04-22 MED ORDER — SODIUM CHLORIDE 0.9% FLUSH: 3.0000 mL | INTRAVENOUS | Status: DC | PRN

## 2016-04-22 MED ORDER — REGADENOSON 0.4 MG/5ML IV SOLN
0.4000 mg | Freq: Once | INTRAVENOUS | Status: AC
  Administered 2016-04-22: 0.4 mg via INTRAVENOUS
  Filled 2016-04-22: qty 5

## 2016-04-22 MED ORDER — NITROGLYCERIN 0.4 MG SL SUBL: 0.4000 mg | SUBLINGUAL_TABLET | SUBLINGUAL | Status: DC | PRN

## 2016-04-22 MED ORDER — ASPIRIN 325 MG PO TBEC: 325.0000 mg | DELAYED_RELEASE_TABLET | Freq: Every day | ORAL | Status: DC

## 2016-04-22 MED ORDER — SODIUM CHLORIDE 0.9 % WEIGHT BASED INFUSION
1.0000 mL/kg/h | INTRAVENOUS | Status: DC
  Administered 2016-04-22 – 2016-04-23 (×3): 1 mL/kg/h via INTRAVENOUS

## 2016-04-22 MED ORDER — METOPROLOL TARTRATE 25 MG/10 ML ORAL SUSPENSION
25.0000 mg | Freq: Two times a day (BID) | ORAL | Status: DC
  Administered 2016-04-22: 25 mg via ORAL
  Filled 2016-04-22 (×2): qty 10

## 2016-04-22 MED ORDER — ASPIRIN 81 MG PO CHEW
81.0000 mg | CHEWABLE_TABLET | ORAL | Status: AC
  Administered 2016-04-23: 81 mg via ORAL
  Filled 2016-04-22: qty 1

## 2016-04-22 MED ORDER — ATORVASTATIN CALCIUM 80 MG PO TABS
80.0000 mg | ORAL_TABLET | Freq: Every day | ORAL | Status: DC
  Administered 2016-04-22: 80 mg via ORAL
  Filled 2016-04-22: qty 1

## 2016-04-22 MED ORDER — TECHNETIUM TC 99M TETROFOSMIN IV KIT
30.0000 | PACK | Freq: Once | INTRAVENOUS | Status: AC | PRN
  Administered 2016-04-22: 30 via INTRAVENOUS

## 2016-04-22 NOTE — Progress Notes (Signed)
PROGRESS NOTE    Brian Butler  [id number] DOB: September 30, 1966 DOA: 04/21/2016 PCP: Schuyler AmorWilliam Plonk, MD   Outpatient Specialists:     Brief Narrative:  Presented with chest pain.  Stress test was indeterminate.    Cards recommends coronary angiography.   Assessment & Plan:   Active Problems:   Hypertension   Uncontrolled type 2 diabetes mellitus without complication (HCC)   Chest pain   Chest pain - CE- negative -heparin gtt -stress test intermediate--- recommend cath -appreciate cardiology consult -ASA  HTN -hold ACE PRN hydralazine  DM -HgbA1C -SSI -holding metformin  HLD -LDL 99   DVT prophylaxis:  Heparin gtt  Code Status: Full Code   Family Communication: Wife at bedside  Disposition Plan:  Per cards   Consultants:   cards  Procedures:   Stress test     Subjective: Wants to go home  Objective: Filed Vitals:   04/22/16 0956 04/22/16 0958 04/22/16 1000 04/22/16 1355  BP: 162/59 160/50 157/52 151/74  Pulse:    75  Temp:    98.4 F (36.9 C)  TempSrc:    Oral  Resp:    20  Height:      Weight:      SpO2:    100%    Intake/Output Summary (Last 24 hours) at 04/22/16 1812 Last data filed at 04/22/16 1358  Gross per 24 hour  Intake    600 ml  Output    950 ml  Net   -350 ml   Filed Weights   04/21/16 1700 04/21/16 2005  Weight: 95.3 kg (210 lb 1.6 oz) 97.886 kg (215 lb 12.8 oz)    Examination:  General exam: irritated, wants to go home Respiratory system: Clear to auscultation. Respiratory effort normal. Cardiovascular system: S1 & S2 heard, RRR. No JVD, murmurs, rubs, gallops or clicks. No pedal edema.     Data Reviewed: I have personally reviewed following labs and imaging studies  CBC:  Recent Labs Lab 04/21/16 1518 04/22/16 0211  WBC 7.6 8.1  HGB 13.8 13.1  HCT 41.2 40.1  MCV 86.0 87.4  PLT 178 169   Basic Metabolic Panel:  Recent Labs Lab 04/21/16 1518 04/22/16 0211  NA 136 137  K 3.8 3.5    CL 105 104  CO2 25 27  GLUCOSE 128* 120*  BUN 18 15  CREATININE 1.50* 1.41*  CALCIUM 9.0 8.4*   GFR: Estimated Creatinine Clearance: 76.8 mL/min (by C-G formula based on Cr of 1.41). Liver Function Tests: No results for input(s): AST, ALT, ALKPHOS, BILITOT, PROT, ALBUMIN in the last 168 hours. No results for input(s): LIPASE, AMYLASE in the last 168 hours. No results for input(s): AMMONIA in the last 168 hours. Coagulation Profile: No results for input(s): INR, PROTIME in the last 168 hours. Cardiac Enzymes:  Recent Labs Lab 04/21/16 2029 04/21/16 2249 04/22/16 0211  TROPONINI <0.03 <0.03 <0.03   BNP (last 3 results) No results for input(s): PROBNP in the last 8760 hours. HbA1C: No results for input(s): HGBA1C in the last 72 hours. CBG:  Recent Labs Lab 04/21/16 2015 04/22/16 0601 04/22/16 1129 04/22/16 1640  GLUCAP 104* 112* 178* 124*   Lipid Profile:  Recent Labs  04/22/16 0211  CHOL 199  HDL 44  LDLCALC 99  TRIG 278*  CHOLHDL 4.5   Thyroid Function Tests: No results for input(s): TSH, T4TOTAL, FREET4, T3FREE, THYROIDAB in the last 72 hours. Anemia Panel: No results for input(s): VITAMINB12, FOLATE, FERRITIN, TIBC, IRON, RETICCTPCT in the  last 72 hours. Urine analysis:    Component Value Date/Time   COLORURINE YELLOW 09/08/2015 1304   COLORURINE Yellow 04/13/2013 1423   APPEARANCEUR Hazy* 01/24/2016 0831   APPEARANCEUR CLEAR 09/08/2015 1304   APPEARANCEUR Hazy 04/13/2013 1423   LABSPEC 1.025 09/08/2015 1304   LABSPEC 1.037 04/13/2013 1423   PHURINE 6.0 09/08/2015 1304   PHURINE 5.0 04/13/2013 1423   GLUCOSEU 1+* 01/24/2016 0831   GLUCOSEU >=500 04/13/2013 1423   HGBUR NEGATIVE 09/08/2015 1304   HGBUR 3+ 04/13/2013 1423   BILIRUBINUR Negative 01/24/2016 0831   BILIRUBINUR NEGATIVE 09/08/2015 1304   BILIRUBINUR neg 09/05/2015 1139   BILIRUBINUR Negative 04/13/2013 1423   KETONESUR NEGATIVE 09/08/2015 1304   KETONESUR Negative 04/13/2013 1423    PROTEINUR 3+* 01/24/2016 0831   PROTEINUR NEGATIVE 09/08/2015 1304   PROTEINUR neg 09/05/2015 1139   PROTEINUR 30 mg/dL 16/09/9603 5409   UROBILINOGEN 0.2 09/05/2015 1139   NITRITE Negative 01/24/2016 0831   NITRITE NEGATIVE 09/08/2015 1304   NITRITE neg 09/05/2015 1139   NITRITE Negative 04/13/2013 1423   LEUKOCYTESUR Trace* 01/24/2016 0831   LEUKOCYTESUR NEGATIVE 09/08/2015 1304   LEUKOCYTESUR Negative 04/13/2013 1423     )No results found for this or any previous visit (from the past 240 hour(s)).    Anti-infectives    None       Radiology Studies: Nm Myocar Multi W/spect W/wall Motion / Ef  04/22/2016   There was no ST segment deviation noted during stress.  There is a medium defect of mild severity present in the basal anteroseptal, mid anteroseptal and apical septal location. The defect is partially reversible and and could be due to variations in soft tissue attenuation artifact but also could be concerning for a small area of ischemia.  There is a small defect of mild severity present in the mid anterior location. The defect is reversible and concerning for a very small area of ischemia in the mid anterior wall.  This is an intermediate risk study.  The left ventricular ejection fraction is normal (55-65%).    Dg Chest Port 1 View  04/21/2016  CLINICAL DATA:  Centralized chest pain and SOB since this morning. EXAM: PORTABLE CHEST 1 VIEW COMPARISON:  None. FINDINGS: Cardiac silhouette upper normal to mildly enlarged. Vascular pattern normal. Lungs clear. IMPRESSION: Cardiac silhouette upper normal to mildly enlarged Electronically Signed   By: Esperanza Heir M.D.   On: 04/21/2016 15:37        Scheduled Meds: . aspirin EC  325 mg Oral Daily  . insulin aspart  0-15 Units Subcutaneous TID WC  . insulin aspart  0-5 Units Subcutaneous QHS   Continuous Infusions: . sodium chloride    . heparin 1,300 Units/hr (04/22/16 1112)        Time spent: 25  min    Esau Fridman U Estephania Licciardi, DO [facility] Pager [phone]  If 7PM-7AM, please contact night-coverage [email or web address] Password [facility] 04/22/2016, 6:12 PM

## 2016-04-22 NOTE — Progress Notes (Signed)
Subjective: No chest pain  Objective: Vital signs in last 24 hours: Temp:  [97.7 F (36.5 C)-99.4 F (37.4 C)] 97.7 F (36.5 C) (05/23 0500) Pulse Rate:  [58-124] 58 (05/23 0500) Resp:  [11-26] 16 (05/23 0500) BP: (119-205)/(50-97) 157/52 mmHg (05/23 1000) SpO2:  [95 %-100 %] 100 % (05/23 0500) Weight:  [210 lb 1.6 oz (95.3 kg)-215 lb 12.8 oz (97.886 kg)] 215 lb 12.8 oz (97.886 kg) (05/22 2005) Weight change:    Intake/Output from previous day: -410 05/22 0701 - 05/23 0700 In: 240 [P.O.:240] Out: 650 [Urine:650] Intake/Output this shift:    PE: General:Pleasant affect, NAD Skin:Warm and dry, brisk capillary refill HEENT:normocephalic, sclera clear, mucus membranes moist Neck:supple, no JVD, no bruits  Heart:S1S2 RRR without murmur, gallup, rub or click Lungs:clear without rales, rhonchi, or wheezes WGN:FAOZAbd:soft, non tender, + BS, do not palpate liver spleen or masses Ext:no lower ext edema, 2+ pedal pulses, 2+ radial pulses Neuro:alert and oriented, MAE, follows commands, + facial symmetry Tele: SR  Lab Results:  Recent Labs  04/21/16 1518 04/22/16 0211  WBC 7.6 8.1  HGB 13.8 13.1  HCT 41.2 40.1  PLT 178 169   BMET  Recent Labs  04/21/16 1518 04/22/16 0211  NA 136 137  K 3.8 3.5  CL 105 104  CO2 25 27  GLUCOSE 128* 120*  BUN 18 15  CREATININE 1.50* 1.41*  CALCIUM 9.0 8.4*    Recent Labs  04/21/16 2249 04/22/16 0211  TROPONINI <0.03 <0.03    Lab Results  Component Value Date   CHOL 199 04/22/2016   HDL 44 04/22/2016   LDLCALC 99 04/22/2016   TRIG 278* 04/22/2016   CHOLHDL 4.5 04/22/2016   Lab Results  Component Value Date   HGBA1C 7.7* 09/05/2015     Lab Results  Component Value Date   TSH 2.140 09/05/2015    Hepatic Function Panel No results for input(s): PROT, ALBUMIN, AST, ALT, ALKPHOS, BILITOT, BILIDIR, IBILI in the last 72 hours.  Recent Labs  04/22/16 0211  CHOL 199   No results for input(s): PROTIME in the  last 72 hours.     Studies/Results: Dg Chest Port 1 View  04/21/2016  CLINICAL DATA:  Centralized chest pain and SOB since this morning. EXAM: PORTABLE CHEST 1 VIEW COMPARISON:  None. FINDINGS: Cardiac silhouette upper normal to mildly enlarged. Vascular pattern normal. Lungs clear. IMPRESSION: Cardiac silhouette upper normal to mildly enlarged Electronically Signed   By: Esperanza Heiraymond  Rubner M.D.   On: 04/21/2016 15:37    Medications: I have reviewed the patient's current medications. Scheduled Meds: . aspirin EC  325 mg Oral Daily  . insulin aspart  0-15 Units Subcutaneous TID WC  . insulin aspart  0-5 Units Subcutaneous QHS   Continuous Infusions: . heparin 1,300 Units/hr (04/22/16 0121)   PRN Meds:.acetaminophen, gi cocktail, hydrALAZINE, HYDROcodone-acetaminophen, morphine injection, ondansetron (ZOFRAN) IV  Assessment/Plan: Active Problems:   Hypertension   Uncontrolled type 2 diabetes mellitus without complication (HCC)   Chest pain  Chest pain, neg troponin on IV heparin.  For Nuc study.   htn 157/52  Will resume lisinopril once nuc results back.    DM-2     Time spent with pt. : 15 minutes. Nada BoozerLaura Ingold  Nurse Practitioner Certified Pager [phone] or after 5pm and on weekends call [phone] 04/22/2016, 10:02 AM The patient has been seen in conjunction with Nada BoozerLaura Ingold, NP. All aspects of care have been considered and discussed. The  patient has been personally interviewed, examined, and all clinical data has been reviewed.   Overall, markers are negative for evidence of infarction. Awaiting results of nuclear study. If nuclear study is low risk are normal he will be eligible for discharge. If higher risk nuclear study, he will probably need coronary angiography.

## 2016-04-22 NOTE — Progress Notes (Signed)
lexiscan myoview completed BP elevated.  No chest pain.  nuc results to follow.

## 2016-04-22 NOTE — Progress Notes (Signed)
ANTICOAGULATION CONSULT NOTE - Follow Up Consult  Pharmacy Consult for heparin Indication: chest pain/ACS  Labs:  Recent Labs  04/21/16 1518 04/21/16 2029 04/21/16 2249 04/21/16 2336  HGB 13.8  --   --   --   HCT 41.2  --   --   --   PLT 178  --   --   --   HEPARINUNFRC  --   --   --  0.33  CREATININE 1.50*  --   --   --   TROPONINI  --  <0.03 <0.03  --     Assessment: 50yo male therapeutic on heparin with initial dosing for CP though at low end of goal.  Goal of Therapy:  Heparin level 0.3-0.7 units/ml   Plan:  Will increase heparin gtt slightly to 1300 units/hr to prevent from dropping below goal and check level in ~6hr.  Vernard GamblesVeronda Elizeth Weinrich, PharmD, BCPS  04/22/2016,12:40 AM

## 2016-04-22 NOTE — Progress Notes (Signed)
   Nuclear study suggests mild anterior ischemia. Therefore, I would recommend coronary angiography to exclude /assess for LAD disease. The patient was counseled to undergo left heart catheterization, coronary angiography, and possible percutaneous coronary intervention with stent implantation. The procedural risks and benefits were discussed in detail. The risks discussed included death, stroke, myocardial infarction, life-threatening bleeding, limb ischemia, kidney injury, allergy, and possible emergency cardiac surgery. The risk of these significant complications were estimated to occur less than 1% of the time. After discussion, the patient has agreed to proceed. Start beta blocker, high intensity statin, and NTG prn.

## 2016-04-23 ENCOUNTER — Encounter (HOSPITAL_COMMUNITY): Payer: Self-pay | Admitting: Nurse Practitioner

## 2016-04-23 ENCOUNTER — Encounter (HOSPITAL_COMMUNITY)
Admission: EM | Disposition: A | Discharge status: Home / Self Care | Payer: Self-pay | Source: Home / Self Care | Attending: Emergency Medicine

## 2016-04-23 DIAGNOSIS — I2510 Atherosclerotic heart disease of native coronary artery without angina pectoris: Secondary | ICD-10-CM

## 2016-04-23 DIAGNOSIS — R079 Chest pain, unspecified: Secondary | ICD-10-CM | POA: Insufficient documentation

## 2016-04-23 DIAGNOSIS — I10 Essential (primary) hypertension: Secondary | ICD-10-CM | POA: Diagnosis not present

## 2016-04-23 DIAGNOSIS — R072 Precordial pain: Secondary | ICD-10-CM | POA: Diagnosis not present

## 2016-04-23 DIAGNOSIS — I200 Unstable angina: Secondary | ICD-10-CM

## 2016-04-23 DIAGNOSIS — I129 Hypertensive chronic kidney disease with stage 1 through stage 4 chronic kidney disease, or unspecified chronic kidney disease: Secondary | ICD-10-CM | POA: Diagnosis not present

## 2016-04-23 DIAGNOSIS — E1165 Type 2 diabetes mellitus with hyperglycemia: Secondary | ICD-10-CM | POA: Diagnosis not present

## 2016-04-23 DIAGNOSIS — R931 Abnormal findings on diagnostic imaging of heart and coronary circulation: Secondary | ICD-10-CM

## 2016-04-23 DIAGNOSIS — N183 Chronic kidney disease, stage 3 (moderate): Secondary | ICD-10-CM | POA: Diagnosis not present

## 2016-04-23 DIAGNOSIS — E1122 Type 2 diabetes mellitus with diabetic chronic kidney disease: Secondary | ICD-10-CM | POA: Diagnosis not present

## 2016-04-23 DIAGNOSIS — E785 Hyperlipidemia, unspecified: Secondary | ICD-10-CM | POA: Diagnosis not present

## 2016-04-23 DIAGNOSIS — I209 Angina pectoris, unspecified: Secondary | ICD-10-CM | POA: Diagnosis not present

## 2016-04-23 DIAGNOSIS — Z7984 Long term (current) use of oral hypoglycemic drugs: Secondary | ICD-10-CM | POA: Diagnosis not present

## 2016-04-23 HISTORY — PX: CARDIAC CATHETERIZATION: SHX172

## 2016-04-23 LAB — BASIC METABOLIC PANEL
Anion gap: 5 (ref 5–15)
BUN: 15 mg/dL (ref 6–20)
CALCIUM: 8.9 mg/dL (ref 8.9–10.3)
CHLORIDE: 105 mmol/L (ref 101–111)
CO2: 26 mmol/L (ref 22–32)
CREATININE: 1.36 mg/dL — AB (ref 0.61–1.24)
GFR, EST NON AFRICAN AMERICAN: 60 mL/min — AB (ref >60)
Glucose, Bld: 119 mg/dL — ABNORMAL HIGH (ref 65–99)
Potassium: 4.2 mmol/L (ref 3.5–5.1)
SODIUM: 136 mmol/L (ref 135–145)

## 2016-04-23 LAB — CBC
HEMATOCRIT: 41.2 % (ref 39.0–52.0)
Hemoglobin: 13.5 g/dL (ref 13.0–17.0)
MCH: 28.5 pg (ref 26.0–34.0)
MCHC: 32.8 g/dL (ref 30.0–36.0)
MCV: 86.9 fL (ref 78.0–100.0)
Platelets: 178 10*3/uL (ref 150–400)
RBC: 4.74 MIL/uL (ref 4.22–5.81)
RDW: 12.8 % (ref 11.5–15.5)
WBC: 8 10*3/uL (ref 4.0–10.5)

## 2016-04-23 LAB — PROTIME-INR
INR: 1 (ref 0.00–1.49)
PROTHROMBIN TIME: 13.4 s (ref 11.6–15.2)

## 2016-04-23 LAB — HEMOGLOBIN A1C
HEMOGLOBIN A1C: 6.6 % — AB (ref 4.8–5.6)
Mean Plasma Glucose: 143 mg/dL

## 2016-04-23 LAB — HEPARIN LEVEL (UNFRACTIONATED)
HEPARIN UNFRACTIONATED: 0.4 [IU]/mL (ref 0.30–0.70)
Heparin Unfractionated: 0.29 IU/mL — ABNORMAL LOW (ref 0.30–0.70)

## 2016-04-23 LAB — GLUCOSE, CAPILLARY
GLUCOSE-CAPILLARY: 109 mg/dL — AB (ref 65–99)
Glucose-Capillary: 131 mg/dL — ABNORMAL HIGH (ref 65–99)
Glucose-Capillary: 112 mg/dL — ABNORMAL HIGH (ref 65–99)

## 2016-04-23 SURGERY — LEFT HEART CATH AND CORONARY ANGIOGRAPHY
Anesthesia: LOCAL

## 2016-04-23 MED ORDER — HEPARIN SODIUM (PORCINE) 1000 UNIT/ML IJ SOLN
INTRAMUSCULAR | Status: DC | PRN
Start: 2016-04-23 — End: 2016-04-23
  Administered 2016-04-23: 5000 [IU] via INTRAVENOUS

## 2016-04-23 MED ORDER — SODIUM CHLORIDE 0.9 % WEIGHT BASED INFUSION: 3.0000 mL/kg/h | INTRAVENOUS | Status: DC

## 2016-04-23 MED ORDER — FENTANYL CITRATE (PF) 100 MCG/2ML IJ SOLN
INTRAMUSCULAR | Status: DC | PRN
  Administered 2016-04-23 (×3): 50 ug via INTRAVENOUS

## 2016-04-23 MED ORDER — ATORVASTATIN CALCIUM 20 MG PO TABS: 20.0000 mg | ORAL_TABLET | Freq: Every day | ORAL | Status: DC

## 2016-04-23 MED ORDER — OXYCODONE-ACETAMINOPHEN 5-325 MG PO TABS: 1.0000 | ORAL_TABLET | ORAL | Status: DC | PRN

## 2016-04-23 MED ORDER — HEPARIN (PORCINE) IN NACL 2-0.9 UNIT/ML-% IJ SOLN
INTRAMUSCULAR | Status: DC | PRN
  Administered 2016-04-23: 1000 mL

## 2016-04-23 MED ORDER — ONDANSETRON HCL 4 MG/2ML IJ SOLN: 4.0000 mg | Freq: Four times a day (QID) | INTRAMUSCULAR | Status: DC | PRN

## 2016-04-23 MED ORDER — IOPAMIDOL (ISOVUE-370) INJECTION 76%
INTRAVENOUS | Status: AC
  Filled 2016-04-23: qty 100

## 2016-04-23 MED ORDER — LIDOCAINE HCL (PF) 1 % IJ SOLN
INTRAMUSCULAR | Status: AC
  Filled 2016-04-23: qty 30

## 2016-04-23 MED ORDER — HEPARIN SODIUM (PORCINE) 1000 UNIT/ML IJ SOLN
INTRAMUSCULAR | Status: AC
  Filled 2016-04-23: qty 1

## 2016-04-23 MED ORDER — ATORVASTATIN CALCIUM 20 MG PO TABS
20.0000 mg | ORAL_TABLET | Freq: Every day | ORAL | Status: DC
  Administered 2016-04-23: 20 mg via ORAL
  Filled 2016-04-23: qty 1

## 2016-04-23 MED ORDER — VERAPAMIL HCL 2.5 MG/ML IV SOLN
INTRAVENOUS | Status: DC | PRN
  Administered 2016-04-23: 16:00:00 via INTRA_ARTERIAL

## 2016-04-23 MED ORDER — SODIUM CHLORIDE 0.9 % IV SOLN: 250.0000 mL | INTRAVENOUS | Status: DC | PRN

## 2016-04-23 MED ORDER — SODIUM CHLORIDE 0.9% FLUSH: 3.0000 mL | INTRAVENOUS | Status: DC | PRN

## 2016-04-23 MED ORDER — FENTANYL CITRATE (PF) 100 MCG/2ML IJ SOLN
INTRAMUSCULAR | Status: AC
  Filled 2016-04-23: qty 2

## 2016-04-23 MED ORDER — ASPIRIN 81 MG PO CHEW: 81.0000 mg | CHEWABLE_TABLET | Freq: Every day | ORAL | Status: DC

## 2016-04-23 MED ORDER — MIDAZOLAM HCL 2 MG/2ML IJ SOLN
INTRAMUSCULAR | Status: AC
  Filled 2016-04-23: qty 2

## 2016-04-23 MED ORDER — ACETAMINOPHEN 325 MG PO TABS: 650.0000 mg | ORAL_TABLET | ORAL | Status: DC | PRN

## 2016-04-23 MED ORDER — ASPIRIN 325 MG PO TBEC: 325.0000 mg | DELAYED_RELEASE_TABLET | Freq: Every day | ORAL | Status: DC

## 2016-04-23 MED ORDER — LIDOCAINE HCL (PF) 1 % IJ SOLN
INTRAMUSCULAR | Status: DC | PRN
Start: 2016-04-23 — End: 2016-04-23
  Administered 2016-04-23: 2 mL

## 2016-04-23 MED ORDER — HEPARIN (PORCINE) IN NACL 2-0.9 UNIT/ML-% IJ SOLN
INTRAMUSCULAR | Status: AC
  Filled 2016-04-23: qty 1000

## 2016-04-23 MED ORDER — VERAPAMIL HCL 2.5 MG/ML IV SOLN
INTRAVENOUS | Status: AC
  Filled 2016-04-23: qty 2

## 2016-04-23 MED ORDER — IOPAMIDOL (ISOVUE-370) INJECTION 76%
INTRAVENOUS | Status: DC | PRN
Start: 2016-04-23 — End: 2016-04-23
  Administered 2016-04-23: 75 mL via INTRAVENOUS

## 2016-04-23 MED ORDER — SODIUM CHLORIDE 0.9% FLUSH: 3.0000 mL | Freq: Two times a day (BID) | INTRAVENOUS | Status: DC

## 2016-04-23 MED ORDER — MIDAZOLAM HCL 2 MG/2ML IJ SOLN
INTRAMUSCULAR | Status: DC | PRN
  Administered 2016-04-23 (×3): 1 mg via INTRAVENOUS

## 2016-04-23 MED ORDER — NITROGLYCERIN 1 MG/10 ML FOR IR/CATH LAB
INTRA_ARTERIAL | Status: AC
  Filled 2016-04-23: qty 10

## 2016-04-23 SURGICAL SUPPLY — 9 items
CATH INFINITI 5 FR JL3.5 (CATHETERS) ×4 IMPLANT
CATH INFINITI JR4 5F (CATHETERS) ×2 IMPLANT
DEVICE RAD COMP TR BAND LRG (VASCULAR PRODUCTS) ×2 IMPLANT
GLIDESHEATH SLEND A-KIT 6F 22G (SHEATH) ×2 IMPLANT
KIT HEART LEFT (KITS) ×2 IMPLANT
PACK CARDIAC CATHETERIZATION (CUSTOM PROCEDURE TRAY) ×2 IMPLANT
TRANSDUCER W/STOPCOCK (MISCELLANEOUS) ×2 IMPLANT
TUBING CIL FLEX 10 FLL-RA (TUBING) ×2 IMPLANT
WIRE SAFE-T 1.5MM-J .035X260CM (WIRE) ×2 IMPLANT

## 2016-04-23 NOTE — Progress Notes (Signed)
ANTICOAGULATION CONSULT NOTE - Initial Consult  Pharmacy Consult for heparin Indication: chest pain/ACS  No Known Allergies  Patient Measurements: Height: 6' (182.9 cm) Weight: 211 lb 14.4 oz (96.117 kg) IBW/kg (Calculated) : 77.6 Heparin Dosing Weight: 95kg  Vital Signs: Temp: 98 F (36.7 C) (05/24 0653) Temp Source: Oral (05/24 0653) BP: 119/73 mmHg (05/24 0941) Pulse Rate: 51 (05/24 0941)  Labs:  Recent Labs  04/21/16 1518 04/21/16 2029 04/21/16 2249 04/21/16 2336 04/22/16 0211 04/23/16 0315 04/23/16 1016  HGB 13.8  --   --   --  13.1 13.5  --   HCT 41.2  --   --   --  40.1 41.2  --   PLT 178  --   --   --  169 178  --   LABPROT  --   --   --   --   --  13.4  --   INR  --   --   --   --   --  1.00  --   HEPARINUNFRC  --   --   --  0.33  --  0.29* 0.40  CREATININE 1.50*  --   --   --  1.41*  --  1.36*  TROPONINI  --  <0.03 <0.03  --  <0.03  --   --     Estimated Creatinine Clearance: 79 mL/min (by C-G formula based on Cr of 1.36).   Medical History: Past Medical History  Diagnosis Date  . Hypertension 09/05/2015  . FH: stroke 09/05/2015  . Hx of hypotestosteronemia 09/05/2015  . Hyperglycemia 09/05/2015  . Uncontrolled type II diabetes mellitus (HCC)   . High cholesterol   . Chronic back pain   . Plantar fasciitis, bilateral   . Nephrolithiasis "several times"  . CKD (chronic kidney disease), stage III     /notes 04/21/2016    Medications:  Infusions:  . sodium chloride Stopped (04/22/16 2014)  . sodium chloride 1 mL/kg/hr (04/23/16 1241)  . heparin 1,400 Units/hr (04/23/16 0433)    Assessment: 50 yom presented to the ED with CP. To start IV heparin. Baseline CBC is WNL and he is not on anticoagulation PTA. Pt had a stress test yesterday - which was indeterminate. Planing to proceed with cath today.  Goal of Therapy:  Heparin level 0.3-0.7 units/ml Monitor platelets by anticoagulation protocol: Yes   Plan:  - Continue heparin at 1400 units/hr -  Daily HL and CBC - F/u after cath today   Baldemar FridayMasters, James Senn M 04/23/2016,1:59 PM

## 2016-04-23 NOTE — Progress Notes (Signed)
       Patient Name: Brian Butler Date of Encounter: 04/23/2016    SUBJECTIVE: No recurrence of chest pain overnight. I spent considerable time with the patient and his wife last evening. After discussion we have decided to perform coronary angiography. The reason is that the abnormality is in the distribution of the LAD. We want to be certain we understand the significance of this problem and whether or not mechanical revascularization is necessary.  TELEMETRY:  Normal sinus rhythm: Filed Vitals:   04/22/16 1849 04/22/16 2136 04/23/16 0653 04/23/16 0941  BP:  131/82 137/86 119/73  Pulse:  61 67 51  Temp:  98.2 F (36.8 C) 98 F (36.7 C)   TempSrc:  Oral Oral   Resp:  20 18   Height:      Weight: 211 lb 14.4 oz (96.117 kg)     SpO2:  99% 100%     Intake/Output Summary (Last 24 hours) at 04/23/16 1033 Last data filed at 04/23/16 1010  Gross per 24 hour  Intake 1989.15 ml  Output   1275 ml  Net 714.15 ml   LABS: Basic Metabolic Panel:  Recent Labs  16/08/9604/22/17 1518 04/22/16 0211  NA 136 137  K 3.8 3.5  CL 105 104  CO2 25 27  GLUCOSE 128* 120*  BUN 18 15  CREATININE 1.50* 1.41*  CALCIUM 9.0 8.4*   CBC:  Recent Labs  04/22/16 0211 04/23/16 0315  WBC 8.1 8.0  HGB 13.1 13.5  HCT 40.1 41.2  MCV 87.4 86.9  PLT 169 178   Cardiac Enzymes:  Recent Labs  04/21/16 2029 04/21/16 2249 04/22/16 0211  TROPONINI <0.03 <0.03 <0.03   BNP: Invalid input(s): POCBNP Hemoglobin A1C:  Recent Labs  04/22/16 0211  HGBA1C 6.6*   Fasting Lipid Panel:  Recent Labs  04/22/16 0211  CHOL 199  HDL 44  LDLCALC 99  TRIG 278*  CHOLHDL 4.5    Radiology/Studies:  No new data other than the nuclear study which was read as intermediate risk  Physical Exam: Blood pressure 119/73, pulse 51, temperature 98 F (36.7 C), temperature source Oral, resp. rate 18, height 6' (1.829 m), weight 211 lb 14.4 oz (96.117 kg), SpO2 100 %. Weight change: 1 lb 12.8 oz (0.817 kg)    Wt Readings from Last 3 Encounters:  04/22/16 211 lb 14.4 oz (96.117 kg)  01/24/16 210 lb (95.255 kg)  01/16/16 200 lb (90.719 kg)    Chest is clear Cardiac exam reveals no gallop or rub. Abdomen is soft. Bowel sounds are normal.   ASSESSMENT:  1. Chest pain compatible with angina, intermediate risk myocardial perfusion study with abnormality noted in the LAD distribution. 2. Essential hypertension, controlled 3. Uncontrolled type 2 diabetes  Plan:  1. Coronary angiography to define anatomy and help guide therapy 2. The patient was counseled to undergo left heart catheterization, coronary angiography, and possible percutaneous coronary intervention with stent implantation. The procedural risks and benefits were discussed in detail. The risks discussed included death, stroke, myocardial infarction, life-threatening bleeding, limb ischemia, kidney injury, allergy, and possible emergency cardiac surgery. The risk of these significant complications were estimated to occur less than 1% of the time. After discussion, the patient has agreed to proceed. 3. Disposition will be dependent upon findings at cath. Could potentially go home this evening if no intervention and procedure completed before 6:00 PM.  Signed, Lyn RecordsHenry W Smith III 04/23/2016, 10:33 AM

## 2016-04-23 NOTE — Interval H&P Note (Signed)
Cath Lab Visit (complete for each Cath Lab visit)  Clinical Evaluation Leading to the Procedure:   ACS: Yes.    Non-ACS:    Anginal Classification: CCS III  Anti-ischemic medical therapy: Minimal Therapy (1 class of medications)  Non-Invasive Test Results: Intermediate-risk stress test findings: cardiac mortality 1-3%/year  Prior CABG: No previous CABG      History and Physical Interval Note:  04/23/2016 3:24 PM  Brian Butler  has presented today for surgery, with the diagnosis of unstable angina  The various methods of treatment have been discussed with the patient and family. After consideration of risks, benefits and other options for treatment, the patient has consented to  Procedure(s): Left Heart Cath and Coronary Angiography (N/A) as a surgical intervention .  The patient's history has been reviewed, patient examined, no change in status, stable for surgery.  I have reviewed the patient's chart and labs.  Questions were answered to the patient's satisfaction.     Lyn RecordsHenry W Smith III

## 2016-04-23 NOTE — Progress Notes (Signed)
PROGRESS NOTE    Brian Butler  ZOX:[id number] DOB: Apr 30, 1966 DOA: 04/21/2016  PCP: Schuyler Amor, MD   Brief Narrative:  Presented with chest pain.  Stress test was indeterminate. Cards recommends coronary angiography. Plan for cath today.  Assessment & Plan:   Chest pain - CE- negative, no chest pain this AM - currently on heparin drip - stress test intermediate, plan for cardiac cath today  - appreciate cardiology consult  HTN, essential  -hold ACE - PRN hydralazine  DM with complications of CKD stage III - HgbA1C 6.6 - holding metformin prior to cath   CKD stage III - with baseline Cr ~ 1.5 - 1.6 based on record review - Cr 1.4 this AM, monitor closely post cath   HLD - LDL 99   DVT prophylaxis: Heparin gtt  Code Status: Full Code  Family Communication: pt at bedside  Disposition Plan: pending cardiac cath results   Consultants:   Cardiology   Procedures:   Stress test 5/23  Cardiac cath 5/24 -->   Subjective: Anxious to go home,no chest pain or shortness of breath.   Objective: Filed Vitals:   04/22/16 1849 04/22/16 2136 04/23/16 0653 04/23/16 0941  BP:  131/82 137/86 119/73  Pulse:  61 67 51  Temp:  98.2 F (36.8 C) 98 F (36.7 C)   TempSrc:  Oral Oral   Resp:  20 18   Height:      Weight: 96.117 kg (211 lb 14.4 oz)     SpO2:  99% 100%     Intake/Output Summary (Last 24 hours) at 04/23/16 1053 Last data filed at 04/23/16 1010  Gross per 24 hour  Intake 1989.15 ml  Output   1275 ml  Net 714.15 ml   Filed Weights   04/21/16 1700 04/21/16 2005 04/22/16 1849  Weight: 95.3 kg (210 lb 1.6 oz) 97.886 kg (215 lb 12.8 oz) 96.117 kg (211 lb 14.4 oz)    Examination:  General exam: calm, NAD Respiratory system: Clear to auscultation. Respiratory effort normal. Cardiovascular system: S1 & S2 heard, RRR. No JVD, murmurs, rubs, gallops or clicks. No pedal edema.  Data Reviewed: I have personally reviewed following labs and imaging  studies  CBC:  Recent Labs Lab 04/21/16 1518 04/22/16 0211 04/23/16 0315  WBC 7.6 8.1 8.0  HGB 13.8 13.1 13.5  HCT 41.2 40.1 41.2  MCV 86.0 87.4 86.9  PLT 178 169 178   Basic Metabolic Panel:  Recent Labs Lab 04/21/16 1518 04/22/16 0211  NA 136 137  K 3.8 3.5  CL 105 104  CO2 25 27  GLUCOSE 128* 120*  BUN 18 15  CREATININE 1.50* 1.41*  CALCIUM 9.0 8.4*   Coagulation Profile:  Recent Labs Lab 04/23/16 0315  INR 1.00   Cardiac Enzymes:  Recent Labs Lab 04/21/16 2029 04/21/16 2249 04/22/16 0211  TROPONINI <0.03 <0.03 <0.03   HbA1C:  Recent Labs  04/22/16 0211  HGBA1C 6.6*   CBG:  Recent Labs Lab 04/22/16 0601 04/22/16 1129 04/22/16 1640 04/22/16 2139 04/23/16 0645  GLUCAP 112* 178* 124* 114* 131*   Lipid Profile:  Recent Labs  04/22/16 0211  CHOL 199  HDL 44  LDLCALC 99  TRIG 278*  CHOLHDL 4.5    Radiology Studies: Nm Myocar Multi W/spect W/wall Motion / Ef 04/22/2016   There was no ST segment deviation noted during stress.  There is a medium defect of mild severity present in the basal anteroseptal, mid anteroseptal and apical septal location. The  defect is partially reversible and and could be due to variations in soft tissue attenuation artifact but also could be concerning for a small area of ischemia.  There is a small defect of mild severity present in the mid anterior location. The defect is reversible and concerning for a very small area of ischemia in the mid anterior wall.  This is an intermediate risk study.  The left ventricular ejection fraction is normal (55-65%).    Dg Chest Port 1 View 04/21/2016 Cardiac silhouette upper normal to mildly enlarged    Scheduled Meds: . aspirin EC  325 mg Oral Daily  . atorvastatin  80 mg Oral q1800  . insulin aspart  0-15 Units Subcutaneous TID WC  . insulin aspart  0-5 Units Subcutaneous QHS  . metoprolol tartrate  25 mg Oral BID  . sodium chloride flush  3 mL Intravenous Q12H    Continuous Infusions: . sodium chloride Stopped (04/22/16 2014)  . sodium chloride 1 mL/kg/hr (04/23/16 0253)  . heparin 1,400 Units/hr (04/23/16 0433)   Time spent: 25 min  Brian PrestoMAGICK-Januel Doolan, MD [facility] Pager [phone]  If 7PM-7AM, please contact night-coverage [email or web address] Password [email or web address] 04/23/2016, 10:53 AM

## 2016-04-23 NOTE — Discharge Instructions (Signed)
Aspirin and Your Heart  Aspirin is a medicine that affects the way blood clots. Aspirin can be used to help reduce the risk of blood clots, heart attacks, and other heart-related problems.  SHOULD I TAKE ASPIRIN? Your health care provider will help you determine whether it is safe and beneficial for you to take aspirin daily. Taking aspirin daily may be beneficial if you:  Have had a heart attack or chest pain.  Have undergone open heart surgery such as coronary artery bypass surgery (CABG).  Have had coronary angioplasty.  Have experienced a stroke or transient ischemic attack (TIA).  Have peripheral vascular disease (PVD).  Have chronic heart rhythm problems such as atrial fibrillation. ARE THERE ANY RISKS OF TAKING ASPIRIN DAILY? Daily use of aspirin can increase your risk of side effects. Some of these include:  Bleeding. Bleeding problems can be minor or serious. An example of a minor problem is a cut that does not stop bleeding. An example of a more serious problem is stomach bleeding or bleeding into the brain. Your risk of bleeding is increased if you are also taking non-steroidal anti-inflammatory medicine (NSAIDs).  Increased bruising.  Upset stomach.  An allergic reaction. People who have nasal polyps have an increased risk of developing an aspirin allergy. WHAT ARE SOME GUIDELINES I SHOULD FOLLOW WHEN TAKING ASPIRIN?   Take aspirin only as directed by your health care provider. Make sure you understand how much you should take and what form you should take. The two forms of aspirin are:  Non-enteric-coated. This type of aspirin does not have a coating and is absorbed quickly. Non-enteric-coated aspirin is usually recommended for people with chest pain. This type of aspirin also comes in a chewable form.  Enteric-coated. This type of aspirin has a special coating that releases the medicine very slowly. Enteric-coated aspirin causes less stomach upset than non-enteric-coated  aspirin. This type of aspirin should not be chewed or crushed.  Drink alcohol in moderation. Drinking alcohol increases your risk of bleeding. WHEN SHOULD I SEEK MEDICAL CARE?   You have unusual bleeding or bruising.  You have stomach pain.  You have an allergic reaction. Symptoms of an allergic reaction include:  Hives.  Itchy skin.  Swelling of the lips, tongue, or face.  You have ringing in your ears. WHEN SHOULD I SEEK IMMEDIATE MEDICAL CARE?   Your bowel movements are bloody, dark red, or black in color.  You vomit or cough up blood.  You have blood in your urine.  You cough, wheeze, or feel short of breath. If you have any of the following symptoms, this is an emergency. Do not wait to see if the pain will go away. Get medical help at once. Call your local emergency services (911 in the [place] Do not drive yourself to the hospital.  You have severe chest pain, especially if the pain is crushing or pressure-like and spreads to the arms, back, neck, or jaw.  You have stroke-like symptoms, such as:   Loss of vision.   Difficulty talking.   Numbness or weakness on one side of your body.   Numbness or weakness in your arm or leg.   Not thinking clearly or feeling confused.    This information is not intended to replace advice given to you by your health care provider. Make sure you discuss any questions you have with your health care provider.   Document Released: 10/30/2008 Document Revised: 12/08/2014 Document Reviewed: 02/22/2014 Elsevier Interactive Patient Education 2016 [employer]  

## 2016-04-23 NOTE — Discharge Summary (Signed)
Physician Discharge Summary  Brian Butler:[id number] DOB: January 03, 1966 DOA: 04/21/2016  PCP: Schuyler Amor, MD  Admit date: 04/21/2016 Discharge date: 04/23/2016  Recommendations for Outpatient Follow-up:  1. Pt will need to follow up with PCP in 1-2 weeks post discharge 2. Please obtain BMP to evaluate electrolytes and kidney function 3. Please also check CBC to evaluate Hg and Hct levels 4. Pt reported not taking Flomax and was therefore removed from list   Discharge Diagnoses:  Active Problems:   Hypertension   Pain in the chest   Abnormal nuclear cardiac imaging test  Discharge Condition: Stable  Diet recommendation: Heart healthy diet discussed in details   History of present illness:   Brief Narrative:  Presented with chest pain. Stress test was indeterminate. Cards recommends coronary angiography. Plan for cath today.  Assessment & Plan:  Chest pain - CE- negative, no chest pain this AM - currently on heparin drip - per cardiology, cath clean, no further cardiac interventions needed, pt cleared for d/c from cardiology stand point   HTN, essential  - resume home medical regimen upon discharge   DM with complications of CKD stage III - HgbA1C 6.6 - continue home medical regimen   CKD stage III - with baseline Cr ~ 1.5 - 1.6 based on record review  HLD - LDL 99  DVT prophylaxis: Heparin gtt  Code Status: Full Code  Family Communication: pt at bedside  Disposition Plan: home  Consultants:   Cardiology  Procedures:   Stress test 5/23  Cardiac cath 5/24 --> unremarkable  Procedures/Studies: Nm Myocar Multi W/spect W/wall Motion / Ef  04/22/2016   There was no ST segment deviation noted during stress.  There is a medium defect of mild severity present in the basal anteroseptal, mid anteroseptal and apical septal location. The defect is partially reversible and and could be due to variations in soft tissue attenuation artifact but also could  be concerning for a small area of ischemia.  There is a small defect of mild severity present in the mid anterior location. The defect is reversible and concerning for a very small area of ischemia in the mid anterior wall.  This is an intermediate risk study.  The left ventricular ejection fraction is normal (55-65%).    Dg Chest Port 1 View  04/21/2016  CLINICAL DATA:  Centralized chest pain and SOB since this morning. EXAM: PORTABLE CHEST 1 VIEW COMPARISON:  None. FINDINGS: Cardiac silhouette upper normal to mildly enlarged. Vascular pattern normal. Lungs clear. IMPRESSION: Cardiac silhouette upper normal to mildly enlarged Electronically Signed   By: Esperanza Heir M.D.   On: 04/21/2016 15:37    Discharge Exam: Filed Vitals:   04/23/16 1631 04/23/16 1646  BP: 129/65 145/103  Pulse: 64 58  Temp:    Resp:     Filed Vitals:   04/23/16 1542 04/23/16 1603 04/23/16 1631 04/23/16 1646  BP: 142/78 138/68 129/65 145/103  Pulse: 84 60 64 58  Temp:      TempSrc:      Resp: 10     Height:      Weight:      SpO2: 99%   98%    General: Pt is alert, follows commands appropriately, not in acute distress Cardiovascular: Regular rate and rhythm, S1/S2 +, no murmurs, no rubs, no gallops Respiratory: Clear to auscultation bilaterally, no wheezing, no crackles, no rhonchi Abdominal: Soft, non tender, non distended, bowel sounds +, no guarding Extremities: no edema, no cyanosis, pulses palpable  bilaterally DP and PT Neuro: Grossly nonfocal  Discharge Instructions  Discharge Instructions    Diet - low sodium heart healthy    Complete by:  As directed      Diet - low sodium heart healthy    Complete by:  As directed      Discharge instructions    Complete by:  As directed   FLP-- repeat in 6 weeks after diet and lifestyle changes     Increase activity slowly    Complete by:  As directed      Increase activity slowly    Complete by:  As directed             Medication List    STOP  taking these medications        tamsulosin 0.4 MG Caps capsule  Commonly known as:  FLOMAX      TAKE these medications        aspirin 325 MG EC tablet  Take 1 tablet (325 mg total) by mouth daily.     atorvastatin 20 MG tablet  Commonly known as:  LIPITOR  Take 1 tablet (20 mg total) by mouth daily at 6 PM.     HYDROcodone-acetaminophen 5-325 MG tablet  Commonly known as:  NORCO  Take 1 tablet by mouth every 6 (six) hours as needed for moderate pain.     lisinopril 10 MG tablet  Commonly known as:  PRINIVIL,ZESTRIL  Take 1 tablet (10 mg total) by mouth daily.     metFORMIN 500 MG tablet  Commonly known as:  GLUCOPHAGE  Take 1 tablet (500 mg total) by mouth 2 (two) times daily with a meal.            Follow-up Information    Follow up with Schuyler AmorWilliam Plonk, MD In 1 week.   Specialty:  Family Medicine   Contact information:   [address] [place] [address] [phone]       Follow up with Schuyler AmorWilliam Plonk, MD.   Specialty:  Family Medicine   Contact information:   [address] [place] [address] [phone]        The results of significant diagnostics from this hospitalization (including imaging, microbiology, ancillary and laboratory) are listed below for reference.     Microbiology: No results found for this or any previous visit (from the past 240 hour(s)).   Labs: Basic Metabolic Panel:  Recent Labs Lab 04/21/16 1518 04/22/16 0211 04/23/16 1016  NA 136 137 136  K 3.8 3.5 4.2  CL 105 104 105  CO2 25 27 26   GLUCOSE 128* 120* 119*  BUN 18 15 15   CREATININE 1.50* 1.41* 1.36*  CALCIUM 9.0 8.4* 8.9   CBC:  Recent Labs Lab 04/21/16 1518 04/22/16 0211 04/23/16 0315  WBC 7.6 8.1 8.0  HGB 13.8 13.1 13.5  HCT 41.2 40.1 41.2  MCV 86.0 87.4 86.9  PLT 178 169 178   Cardiac Enzymes:  Recent Labs Lab 04/21/16 2029 04/21/16 2249 04/22/16 0211  TROPONINI <0.03 <0.03 <0.03   CBG:  Recent Labs Lab  04/22/16 1640 04/22/16 2139 04/23/16 0645 04/23/16 1131 04/23/16 1633  GLUCAP 124* 114* 131* 109* 112*   SIGNED: Time coordinating discharge: 30 minutes  MAGICK-MYERS, ISKRA, MD  [facility] 04/23/2016, 5:01 PM Pager [phone]  If 7PM-7AM, please contact night-coverage [email or web address] Password [email or web address]

## 2016-04-23 NOTE — H&P (View-Only) (Signed)
       Patient Name: Brian Butler Date of Encounter: 04/23/2016    SUBJECTIVE: No recurrence of chest pain overnight. I spent considerable time with the patient and his wife last evening. After discussion we have decided to perform coronary angiography. The reason is that the abnormality is in the distribution of the LAD. We want to be certain we understand the significance of this problem and whether or not mechanical revascularization is necessary.  TELEMETRY:  Normal sinus rhythm: Filed Vitals:   04/22/16 1849 04/22/16 2136 04/23/16 0653 04/23/16 0941  BP:  131/82 137/86 119/73  Pulse:  61 67 51  Temp:  98.2 F (36.8 C) 98 F (36.7 C)   TempSrc:  Oral Oral   Resp:  20 18   Height:      Weight: 211 lb 14.4 oz (96.117 kg)     SpO2:  99% 100%     Intake/Output Summary (Last 24 hours) at 04/23/16 1033 Last data filed at 04/23/16 1010  Gross per 24 hour  Intake 1989.15 ml  Output   1275 ml  Net 714.15 ml   LABS: Basic Metabolic Panel:  Recent Labs  04/21/16 1518 04/22/16 0211  NA 136 137  K 3.8 3.5  CL 105 104  CO2 25 27  GLUCOSE 128* 120*  BUN 18 15  CREATININE 1.50* 1.41*  CALCIUM 9.0 8.4*   CBC:  Recent Labs  04/22/16 0211 04/23/16 0315  WBC 8.1 8.0  HGB 13.1 13.5  HCT 40.1 41.2  MCV 87.4 86.9  PLT 169 178   Cardiac Enzymes:  Recent Labs  04/21/16 2029 04/21/16 2249 04/22/16 0211  TROPONINI <0.03 <0.03 <0.03   BNP: Invalid input(s): POCBNP Hemoglobin A1C:  Recent Labs  04/22/16 0211  HGBA1C 6.6*   Fasting Lipid Panel:  Recent Labs  04/22/16 0211  CHOL 199  HDL 44  LDLCALC 99  TRIG 278*  CHOLHDL 4.5    Radiology/Studies:  No new data other than the nuclear study which was read as intermediate risk  Physical Exam: Blood pressure 119/73, pulse 51, temperature 98 F (36.7 C), temperature source Oral, resp. rate 18, height 6' (1.829 m), weight 211 lb 14.4 oz (96.117 kg), SpO2 100 %. Weight change: 1 lb 12.8 oz (0.817 kg)    Wt Readings from Last 3 Encounters:  04/22/16 211 lb 14.4 oz (96.117 kg)  01/24/16 210 lb (95.255 kg)  01/16/16 200 lb (90.719 kg)    Chest is clear Cardiac exam reveals no gallop or rub. Abdomen is soft. Bowel sounds are normal.   ASSESSMENT:  1. Chest pain compatible with angina, intermediate risk myocardial perfusion study with abnormality noted in the LAD distribution. 2. Essential hypertension, controlled 3. Uncontrolled type 2 diabetes  Plan:  1. Coronary angiography to define anatomy and help guide therapy 2. The patient was counseled to undergo left heart catheterization, coronary angiography, and possible percutaneous coronary intervention with stent implantation. The procedural risks and benefits were discussed in detail. The risks discussed included death, stroke, myocardial infarction, life-threatening bleeding, limb ischemia, kidney injury, allergy, and possible emergency cardiac surgery. The risk of these significant complications were estimated to occur less than 1% of the time. After discussion, the patient has agreed to proceed. 3. Disposition will be dependent upon findings at cath. Could potentially go home this evening if no intervention and procedure completed before 6:00 PM.  Signed, Alania Overholt W Arelia Volpe III 04/23/2016, 10:33 AM 

## 2016-04-23 NOTE — Progress Notes (Signed)
ANTICOAGULATION CONSULT NOTE - Follow Up Consult  Pharmacy Consult for heparin Indication: chest pain/ACS  Labs:  Recent Labs  04/21/16 1518 04/21/16 2029 04/21/16 2249 04/21/16 2336 04/22/16 0211 04/23/16 0315  HGB 13.8  --   --   --  13.1 13.5  HCT 41.2  --   --   --  40.1 41.2  PLT 178  --   --   --  169 178  LABPROT  --   --   --   --   --  13.4  INR  --   --   --   --   --  1.00  HEPARINUNFRC  --   --   --  0.33  --  0.29*  CREATININE 1.50*  --   --   --  1.41*  --   TROPONINI  --  <0.03 <0.03  --  <0.03  --     Assessment: 50yo male now slightly subtherapeutic on heparin after one level at low end of goal.  Goal of Therapy:  Heparin level 0.3-0.7 units/ml   Plan:  Will increase heparin gtt slightly to 1400 units/hr and check level in ~6hr.  Vernard GamblesVeronda Aika Brzoska, PharmD, BCPS  04/23/2016,4:02 AM

## 2016-04-24 ENCOUNTER — Encounter (HOSPITAL_COMMUNITY): Payer: Self-pay | Admitting: Interventional Cardiology

## 2016-04-24 MED FILL — Nitroglycerin IV Soln 100 MCG/ML in D5W: INTRA_ARTERIAL | Qty: 10 | Status: AC

## 2016-04-30 ENCOUNTER — Ambulatory Visit (INDEPENDENT_AMBULATORY_CARE_PROVIDER_SITE_OTHER): Payer: 59 | Admitting: Family Medicine

## 2016-04-30 ENCOUNTER — Encounter: Payer: Self-pay | Admitting: Family Medicine

## 2016-04-30 VITALS — BP 122/80 | HR 76 | Ht 72.0 in | Wt 204.0 lb

## 2016-04-30 DIAGNOSIS — G8929 Other chronic pain: Secondary | ICD-10-CM | POA: Diagnosis not present

## 2016-04-30 DIAGNOSIS — M545 Low back pain, unspecified: Secondary | ICD-10-CM

## 2016-04-30 DIAGNOSIS — I200 Unstable angina: Secondary | ICD-10-CM

## 2016-04-30 DIAGNOSIS — Z23 Encounter for immunization: Secondary | ICD-10-CM | POA: Diagnosis not present

## 2016-04-30 DIAGNOSIS — E785 Hyperlipidemia, unspecified: Secondary | ICD-10-CM

## 2016-04-30 DIAGNOSIS — E663 Overweight: Secondary | ICD-10-CM | POA: Insufficient documentation

## 2016-04-30 DIAGNOSIS — I10 Essential (primary) hypertension: Secondary | ICD-10-CM | POA: Diagnosis not present

## 2016-04-30 DIAGNOSIS — M722 Plantar fascial fibromatosis: Secondary | ICD-10-CM

## 2016-04-30 DIAGNOSIS — E119 Type 2 diabetes mellitus without complications: Secondary | ICD-10-CM

## 2016-04-30 DIAGNOSIS — E1169 Type 2 diabetes mellitus with other specified complication: Secondary | ICD-10-CM | POA: Insufficient documentation

## 2016-04-30 DIAGNOSIS — N182 Chronic kidney disease, stage 2 (mild): Secondary | ICD-10-CM | POA: Diagnosis not present

## 2016-04-30 NOTE — Progress Notes (Signed)
Date:  04/30/2016   Name:  Brian Butler   DOB:  [birth year]   MRN:  [id number]  PCP:  Schuyler Amor, MD    Chief Complaint: Follow-up   History of Present Illness:  This is a 50 y.o. male discharged from [facility] last following episode prolonged SSCP with diaphoresis. Enzymes were negative, stress test showed normal EF but possible reversible apical ischemia, cath was negative. Has done well since discharge with no further CP. Blood work during admission showed a1c 6.6% and LDL 99 on Lipitor. Weight down 21# from October. C/o persistent L plantar fasciitis, podiatrist is recommending surgery but pt reluctant given DM, requests second opinion. C/o chronic back pain he manages without medications.  Review of Systems:  Review of Systems  Constitutional: Negative for fever and fatigue.  Respiratory: Negative for cough and shortness of breath.   Cardiovascular: Negative for chest pain and leg swelling.  Endocrine: Negative for polyuria.  Genitourinary: Negative for difficulty urinating.  Neurological: Negative for syncope and light-headedness.    Patient Active Problem List   Diagnosis Date Noted  . Hyperlipidemia 04/30/2016  . Abnormal nuclear cardiac imaging test 04/23/2016  . Unstable angina (HCC)   . Chest pain 04/21/2016  . Hypertension 09/05/2015  . Nephrolithiasis 09/05/2015  . Hx of hypotestosteronemia 09/05/2015  . Diabetes mellitus type 2, controlled, without complications (HCC) 09/05/2015    Prior to Admission medications   Medication Sig Start Date End Date Taking? Authorizing Provider  aspirin 325 MG EC tablet Take 1 tablet (325 mg total) by mouth daily. 04/23/16  Yes Dorothea Ogle, MD  atorvastatin (LIPITOR) 20 MG tablet Take 1 tablet (20 mg total) by mouth daily at 6 PM. 04/23/16  Yes Dorothea Ogle, MD  lisinopril (PRINIVIL,ZESTRIL) 10 MG tablet Take 1 tablet (10 mg total) by mouth daily. 12/18/15  Yes Schuyler Amor, MD  metFORMIN (GLUCOPHAGE) 500 MG tablet Take 1 tablet  (500 mg total) by mouth 2 (two) times daily with a meal. 11/22/15  Yes Schuyler Amor, MD    No Known Allergies  Past Surgical History  Procedure Laterality Date  . Anterior cervical decomp/discectomy fusion  2006  . Extracorporeal shock wave lithotripsy Left 09/20/2015    Procedure: EXTRACORPOREAL SHOCK WAVE LITHOTRIPSY (ESWL);  Surgeon: Hildred Laser, MD;  Location: [facility] ORS;  Service: Urology;  Laterality: Left;  . Extracorporeal shock wave lithotripsy Left 11/15/2015    Procedure: EXTRACORPOREAL SHOCK WAVE LITHOTRIPSY (ESWL);  Surgeon: Vanna Scotland, MD;  Location: [facility];  Service: Urology;  Laterality: Left;  . Ureteroscopy with holmium laser lithotripsy Left 12/05/2015    Procedure: URETEROSCOPY WITH HOLMIUM LASER LITHOTRIPSY;  Surgeon: Hildred Laser, MD;  Location: [facility];  Service: Urology;  Laterality: Left;  . Cystoscopy with stent placement Left 12/05/2015    Procedure: CYSTOSCOPY WITH STENT PLACEMENT;  Surgeon: Hildred Laser, MD;  Location: [facility];  Service: Urology;  Laterality: Left;  . Ureteroscopy with holmium laser lithotripsy Left 01/16/2016    Procedure: URETEROSCOPY WITH HOLMIUM LASER LITHOTRIPSY;  Surgeon: Hildred Laser, MD;  Location: [facility];  Service: Urology;  Laterality: Left;  . Cystoscopy w/ retrogrades Left 01/16/2016    Procedure: CYSTOSCOPY WITH RETROGRADE PYELOGRAM;  Surgeon: Hildred Laser, MD;  Location: [facility];  Service: Urology;  Laterality: Left;  . Cystoscopy with stent placement Left 01/16/2016    Procedure: CYSTOSCOPY WITH STENT PLACEMENT/ EXCHANGE;  Surgeon: Hildred Laser, MD;  Location: [facility];  Service: Urology;  Laterality: Left;  .  Back surgery    . Cardiac catheterization N/A 04/23/2016    Procedure: Left Heart Cath and Coronary Angiography;  Surgeon: Lyn RecordsHenry W Smith, MD;  [facility];  Service: Cardiovascular;  Laterality: N/A;    Social History  Substance Use Topics  . Smoking status: Never  Smoker   . Smokeless tobacco: Never Used  . Alcohol Use: No    Family History  Problem Relation Age of Onset  . Stroke Father   . Nephrolithiasis Father   . Nephrolithiasis Father   . Prostate cancer Neg Hx   . Bladder Cancer Neg Hx     Medication list has been reviewed and updated.  Physical Examination: BP 122/80 mmHg  Pulse 76  Ht 6' (1.829 m)  Wt 204 lb (92.534 kg)  BMI 27.66 kg/m2  Physical Exam  Constitutional: He appears well-developed and well-nourished.  Cardiovascular: Normal rate, regular rhythm and normal heart sounds.   Pulmonary/Chest: Effort normal and breath sounds normal.  Musculoskeletal: He exhibits no edema.  Neurological: He is alert.  Skin: Skin is warm and dry.  Psychiatric: He has a normal mood and affect. His behavior is normal.  Nursing note and vitals reviewed.   Assessment and Plan:  1. Unstable angina (HCC) S/p hospitalization with marginal stress test but negative cath, may be vasospastic, consider CCB if recurs  2. Controlled type 2 diabetes mellitus without complication, without long-term current use of insulin (HCC) Continue metformin/weight loss - Urine Microalbumin w/creat. ratio  3. Essential hypertension Well controlled on lisinopril - Comprehensive Metabolic Panel (CMET) - CBC  4. CKD (chronic kidney disease) stage 2, GFR 60-89 ml/min Unclear if related to DM or hx nephrolithiasis, check MCR  5. Hyperlipidemia Well controlled on Lipitor  6. Plantar fasciitis of left foot Refer for second opinion regarding surgery - Ambulatory referral to Podiatry  7. Chronic low back pain Avoid NSAIDs/opioids  8. Need for pneumococcal vaccination - Pneumococcal polysaccharide vaccine 23-valent greater than or equal to 2yo subcutaneous/IM  Return in about 4 weeks (around 05/28/2016).  Dionne AnoWilliam M. Kingsley SpittlePlonk, Jr. MD [facility]  04/30/2016

## 2016-05-01 LAB — COMPREHENSIVE METABOLIC PANEL
ALBUMIN: 4.4 g/dL (ref 3.5–5.5)
ALK PHOS: 69 IU/L (ref 39–117)
ALT: 29 IU/L (ref 0–44)
AST: 16 IU/L (ref 0–40)
Albumin/Globulin Ratio: 1.8 (ref 1.2–2.2)
BUN / CREAT RATIO: 17 (ref 9–20)
BUN: 22 mg/dL (ref 6–24)
Bilirubin Total: 1.6 mg/dL — ABNORMAL HIGH (ref 0.0–1.2)
CHLORIDE: 101 mmol/L (ref 96–106)
CO2: 22 mmol/L (ref 18–29)
CREATININE: 1.32 mg/dL — AB (ref 0.76–1.27)
Calcium: 9.4 mg/dL (ref 8.7–10.2)
GFR calc non Af Amer: 63 mL/min/{1.73_m2} (ref >59)
GFR, EST AFRICAN AMERICAN: 73 mL/min/{1.73_m2} (ref >59)
Globulin, Total: 2.5 g/dL (ref 1.5–4.5)
Glucose: 104 mg/dL — ABNORMAL HIGH (ref 65–99)
Potassium: 4.5 mmol/L (ref 3.5–5.2)
Sodium: 140 mmol/L (ref 134–144)
TOTAL PROTEIN: 6.9 g/dL (ref 6.0–8.5)

## 2016-05-01 LAB — CBC
HEMATOCRIT: 43.4 % (ref 37.5–51.0)
HEMOGLOBIN: 14.6 g/dL (ref 12.6–17.7)
MCH: 29.2 pg (ref 26.6–33.0)
MCHC: 33.6 g/dL (ref 31.5–35.7)
MCV: 87 fL (ref 79–97)
Platelets: 225 10*3/uL (ref 150–379)
RBC: 5 x10E6/uL (ref 4.14–5.80)
RDW: 13.6 % (ref 12.3–15.4)
WBC: 8 10*3/uL (ref 3.4–10.8)

## 2016-05-01 LAB — MICROALBUMIN / CREATININE URINE RATIO
Creatinine, Urine: 138.7 mg/dL
MICROALB/CREAT RATIO: 6.3 mg/g creat (ref 0.0–30.0)
MICROALBUM., U, RANDOM: 8.7 ug/mL

## 2016-05-13 ENCOUNTER — Ambulatory Visit: Payer: 59 | Admitting: Podiatry

## 2016-05-28 ENCOUNTER — Ambulatory Visit: Payer: 59 | Admitting: Family Medicine

## 2016-05-29 ENCOUNTER — Telehealth: Payer: Self-pay | Admitting: *Deleted

## 2016-05-29 ENCOUNTER — Ambulatory Visit (INDEPENDENT_AMBULATORY_CARE_PROVIDER_SITE_OTHER): Payer: 59 | Admitting: Podiatry

## 2016-05-29 ENCOUNTER — Encounter: Payer: Self-pay | Admitting: Podiatry

## 2016-05-29 DIAGNOSIS — M722 Plantar fascial fibromatosis: Secondary | ICD-10-CM | POA: Diagnosis not present

## 2016-05-29 DIAGNOSIS — G8929 Other chronic pain: Secondary | ICD-10-CM

## 2016-05-29 DIAGNOSIS — M79672 Pain in left foot: Secondary | ICD-10-CM

## 2016-05-29 DIAGNOSIS — R52 Pain, unspecified: Secondary | ICD-10-CM | POA: Diagnosis not present

## 2016-05-29 NOTE — Progress Notes (Signed)
   Subjective:    Patient ID: Brian SlipperLarry J Butler, male    DOB: 1966-09-25, 50 y.o.   MRN: [id number]  HPI  50 year old male presents the office they for concerns of left heel pain. He presents today for second opinion at his request. He has been seeing Dr. Charlsie Merlesegal for left heel pain. He had a series of injections performed and he has tried braces as well as shoe changes, occasions without any relief of symptoms. At last appointment Dr. Charlsie Merlesregal discussed with him EPAT and surgery. The patient states that he has not been to be having surgery. I discussed options. This has been ongoing for several years this time. He has pain in the morning first gets out of the end of the day. He denies any numbness or tingling to his feet. The pain does not wake him up. No other complaints at this time.  Review of Systems  All other systems reviewed and are negative.      Objective:   Physical Exam General: AAO x3, NAD  Dermatological: Skin is warm, dry and supple bilateral. There are no open sores, no preulcerative lesions, no rash or signs of infection present.  Vascular: Dorsalis Pedis artery and Posterior Tibial artery pedal pulses are 2/4 bilateral with immedate capillary fill time. Pedal hair growth present. There is no pain with calf compression, swelling, warmth, erythema.   Neruologic: Sensation appears to be intact. Negative Tinel sign.   Tenderness to palpation along the plantar medial tubercle of the calcaneus at the insertion of plantar fascia on the left foot. There is no pain along the course of the plantar fascia within the arch of the foot. Plantar fascia appears to be intact. There is no pain with lateral compression of the calcaneus or pain with vibratory sensation. There is no pain along the course or insertion of the achilles tendon. No other areas of tenderness to bilateral lower extremities. MMT 5/5, ROM WNL.    Musculoskeletal: No gross boney pedal deformities bilateral. No pain, crepitus, or  limitation noted with foot and ankle range of motion bilateral. Muscular strength 5/5 in all groups tested bilateral.  Gait: Unassisted, Nonantalgic.      Assessment & Plan:  50 year old male with left chronic heel pain, likely plantar fasciitis -Treatment options discussed including all alternatives, risks, and complications -Etiology of symptoms were discussed -Previous x-rays and chart reviewed. -Discussed possible MRI. -At this point I discussed with him options including physical therapy, EPAT, surgery. At this point I do recommend him to start physical therapy and he agrees to this. Order was placed for therapy today. -Discussed with him to continue with his orthotics as well as supportive shoes. Also continue stretching, icing on the consistent basis daily. We'll hold off on further steroid injections as they have not been providing relief.

## 2016-05-29 NOTE — Telephone Encounter (Addendum)
-----   Message from Vivi BarrackMatthew R Wagoner, DPM sent at 05/29/2016  8:53 AM EDT ----- [facility]. [facility]

## 2016-05-30 DIAGNOSIS — G8929 Other chronic pain: Secondary | ICD-10-CM | POA: Insufficient documentation

## 2016-05-30 DIAGNOSIS — M722 Plantar fascial fibromatosis: Secondary | ICD-10-CM | POA: Insufficient documentation

## 2016-05-30 DIAGNOSIS — M79673 Pain in unspecified foot: Secondary | ICD-10-CM

## 2016-06-16 ENCOUNTER — Encounter: Payer: Self-pay | Admitting: Family Medicine

## 2016-06-16 ENCOUNTER — Ambulatory Visit (INDEPENDENT_AMBULATORY_CARE_PROVIDER_SITE_OTHER): Payer: 59 | Admitting: Family Medicine

## 2016-06-16 VITALS — BP 120/82 | HR 74 | Temp 97.8°F | Wt 213.0 lb

## 2016-06-16 DIAGNOSIS — R69 Illness, unspecified: Secondary | ICD-10-CM

## 2016-06-16 DIAGNOSIS — Z205 Contact with and (suspected) exposure to viral hepatitis: Secondary | ICD-10-CM | POA: Diagnosis not present

## 2016-06-16 DIAGNOSIS — E785 Hyperlipidemia, unspecified: Secondary | ICD-10-CM

## 2016-06-16 DIAGNOSIS — I10 Essential (primary) hypertension: Secondary | ICD-10-CM

## 2016-06-16 DIAGNOSIS — E119 Type 2 diabetes mellitus without complications: Secondary | ICD-10-CM | POA: Diagnosis not present

## 2016-06-16 MED ORDER — ATORVASTATIN CALCIUM 20 MG PO TABS: 20.0000 mg | ORAL_TABLET | Freq: Every day | ORAL | Status: DC

## 2016-06-16 MED ORDER — METFORMIN HCL 500 MG PO TABS: 500.0000 mg | ORAL_TABLET | Freq: Two times a day (BID) | ORAL | Status: DC

## 2016-06-16 MED ORDER — LISINOPRIL 10 MG PO TABS: 10.0000 mg | ORAL_TABLET | Freq: Every day | ORAL | Status: DC

## 2016-06-16 NOTE — Progress Notes (Signed)
Name: Brian Butler   MRN: [id number]    DOB: February 02, 1966   Date:06/16/2016       Progress Note  Subjective  Chief Complaint  Chief Complaint  Patient presents with  . Hypertension  . lab test    Patient wife is requesting that he be checked for Hepatis due to yellowing of the eyes    Hypertension This is a chronic problem. The current episode started more than 1 year ago. The problem has been waxing and waning since onset. The problem is controlled. Pertinent negatives include no anxiety, blurred vision, chest pain, headaches, malaise/fatigue, neck pain, orthopnea, palpitations, peripheral edema, PND, shortness of breath or sweats. There are no associated agents to hypertension. Risk factors for coronary artery disease include dyslipidemia, diabetes mellitus and male gender. Past treatments include ACE inhibitors. The current treatment provides mild improvement. There are no compliance problems.  There is no history of angina, kidney disease, CAD/MI, CVA, heart failure, left ventricular hypertrophy, PVD, renovascular disease or retinopathy. There is no history of chronic renal disease or a hypertension causing med.  Diabetes He presents for his follow-up diabetic visit. He has type 2 diabetes mellitus. Pertinent negatives for hypoglycemia include no confusion, dizziness, headaches, hunger, mood changes, nervousness/anxiousness, pallor, seizures, sleepiness, speech difficulty, sweats or tremors. Pertinent negatives for diabetes include no blurred vision, no chest pain, no fatigue, no foot paresthesias, no foot ulcerations, no polydipsia, no polyphagia, no polyuria, no visual change, no weakness and no weight loss. There are no hypoglycemic complications. Symptoms are stable. There are no diabetic complications. Pertinent negatives for diabetic complications include no CVA, PVD or retinopathy. Current diabetic treatment includes diet and oral agent (monotherapy). He is compliant with treatment all of  the time. He is following a generally healthy diet. His breakfast blood glucose is taken between 8-9 am. His breakfast blood glucose range is generally 90-110 mg/dl. An ACE inhibitor/angiotensin II receptor blocker is being taken. He sees a podiatrist (plantar fasiitis).Eye exam is not current.  Hyperlipidemia This is a chronic problem. The current episode started more than 1 year ago. The problem is controlled. Recent lipid tests were reviewed and are normal. He has no history of chronic renal disease, diabetes, hypothyroidism, liver disease or obesity. ?gilbert disease. Pertinent negatives include no chest pain, focal weakness, myalgias or shortness of breath. Current antihyperlipidemic treatment includes statins. The current treatment provides mild improvement of lipids. There are no compliance problems.     No problem-specific assessment & plan notes found for this encounter.   Past Medical History  Diagnosis Date  . Hypertension 09/05/2015  . FH: stroke 09/05/2015  . Hx of hypotestosteronemia 09/05/2015  . Hyperglycemia 09/05/2015  . Uncontrolled type II diabetes mellitus (HCC)   . High cholesterol   . Chronic back pain   . Plantar fasciitis, bilateral   . Nephrolithiasis "several times"  . CKD (chronic kidney disease), stage III     Brian Butler 04/21/2016  . Chest pain     a. 03/2016 Myoview: medium defect of mild severity in basal anteroseptal, mid anteroseptal, and apical septal locations - partially reversible;  b. Cath: nl cors, EF 55-65%. EDP 16 mmHg.    Past Surgical History  Procedure Laterality Date  . Anterior cervical decomp/discectomy fusion  2006  . Extracorporeal shock wave lithotripsy Left 09/20/2015    Procedure: EXTRACORPOREAL SHOCK WAVE LITHOTRIPSY (ESWL);  Surgeon: Hildred Laser, MD;  Location: [facility];  Service: Urology;  Laterality: Left;  . Extracorporeal shock wave lithotripsy Left  11/15/2015    Procedure: EXTRACORPOREAL SHOCK WAVE LITHOTRIPSY (ESWL);  Surgeon:  Vanna ScotlandAshley Brandon, MD;  Location: [facility];  Service: Urology;  Laterality: Left;  . Ureteroscopy with holmium laser lithotripsy Left 12/05/2015    Procedure: URETEROSCOPY WITH HOLMIUM LASER LITHOTRIPSY;  Surgeon: Hildred LaserBrian James Budzyn, MD;  Location: [facility];  Service: Urology;  Laterality: Left;  . Cystoscopy with stent placement Left 12/05/2015    Procedure: CYSTOSCOPY WITH STENT PLACEMENT;  Surgeon: Hildred LaserBrian James Budzyn, MD;  Location: [facility];  Service: Urology;  Laterality: Left;  . Ureteroscopy with holmium laser lithotripsy Left 01/16/2016    Procedure: URETEROSCOPY WITH HOLMIUM LASER LITHOTRIPSY;  Surgeon: Hildred LaserBrian James Budzyn, MD;  Location: [facility];  Service: Urology;  Laterality: Left;  . Cystoscopy w/ retrogrades Left 01/16/2016    Procedure: CYSTOSCOPY WITH RETROGRADE PYELOGRAM;  Surgeon: Hildred LaserBrian James Budzyn, MD;  Location: [facility];  Service: Urology;  Laterality: Left;  . Cystoscopy with stent placement Left 01/16/2016    Procedure: CYSTOSCOPY WITH STENT PLACEMENT/ EXCHANGE;  Surgeon: Hildred LaserBrian James Budzyn, MD;  Location: [facility];  Service: Urology;  Laterality: Left;  . Back surgery    . Cardiac catheterization N/A 04/23/2016    Procedure: Left Heart Cath and Coronary Angiography;  Surgeon: Lyn RecordsHenry W Smith, MD;  [facility];  Service: Cardiovascular;  Laterality: N/A;    Family History  Problem Relation Age of Onset  . Stroke Father   . Nephrolithiasis Father   . Nephrolithiasis Father   . Prostate cancer Neg Hx   . Bladder Cancer Neg Hx     Social History   Social History  . Marital Status: Married    Spouse Name: N/A  . Number of Children: N/A  . Years of Education: N/A   Occupational History  . Not on file.   Social History Main Topics  . Smoking status: Never Smoker   . Smokeless tobacco: Never Used  . Alcohol Use: No  . Drug Use: No  . Sexual Activity: Not Currently   Other Topics Concern  . Not on file   Social History Narrative    No Known  Allergies   Review of Systems  Constitutional: Negative for fever, chills, weight loss, malaise/fatigue and fatigue.  HENT: Negative for ear discharge, ear pain and sore throat.   Eyes: Negative for blurred vision.  Respiratory: Negative for cough, sputum production, shortness of breath and wheezing.   Cardiovascular: Negative for chest pain, palpitations, orthopnea, leg swelling and PND.  Gastrointestinal: Negative for heartburn, nausea, abdominal pain, diarrhea, constipation, blood in stool and melena.  Genitourinary: Negative for dysuria, urgency, frequency and hematuria.  Musculoskeletal: Positive for back pain and joint pain. Negative for myalgias and neck pain.  Skin: Negative for pallor and rash.  Neurological: Negative for dizziness, tingling, tremors, sensory change, focal weakness, seizures, speech difficulty, weakness and headaches.  Endo/Heme/Allergies: Negative for environmental allergies, polydipsia and polyphagia. Does not bruise/bleed easily.  Psychiatric/Behavioral: Negative for depression, suicidal ideas and confusion. The patient is not nervous/anxious and does not have insomnia.      Objective  Filed Vitals:   06/16/16 1036 06/16/16 1117  BP: 160/78 120/82  Pulse: 74   Temp: 97.8 F (36.6 C)   Weight: 213 lb (96.616 kg)     Physical Exam  Constitutional: He is oriented to person, place, and time and well-developed, well-nourished, and in no distress.  HENT:  Head: Normocephalic.  Right Ear: External ear normal.  Left Ear: External ear normal.  Nose:  Nose normal.  Mouth/Throat: Oropharynx is clear and moist.  Eyes: Conjunctivae and EOM are normal. Pupils are equal, round, and reactive to light. Right eye exhibits no discharge. Left eye exhibits no discharge. No scleral icterus.  Neck: Normal range of motion. Neck supple. No JVD present. No tracheal deviation present. No thyromegaly present.  Cardiovascular: Normal rate, regular rhythm, normal heart sounds  and intact distal pulses.  Exam reveals no gallop and no friction rub.   No murmur heard. Pulmonary/Chest: Breath sounds normal. No respiratory distress. He has no wheezes. He has no rales.  Abdominal: Soft. Bowel sounds are normal. He exhibits no mass. There is no hepatosplenomegaly. There is no tenderness. There is no rebound, no guarding and no CVA tenderness.  Musculoskeletal: Normal range of motion. He exhibits no edema or tenderness.  Lymphadenopathy:    He has no cervical adenopathy.  Neurological: He is alert and oriented to person, place, and time. He has normal sensation, normal strength, normal reflexes and intact cranial nerves. No cranial nerve deficit.  Skin: Skin is warm. No rash noted.  Psychiatric: Mood and affect normal.  Nursing note and vitals reviewed.     Assessment & Plan  Problem List Items Addressed This Visit      Cardiovascular and Mediastinum   Hypertension - Primary   Relevant Medications   atorvastatin (LIPITOR) 20 MG tablet   lisinopril (PRINIVIL,ZESTRIL) 10 MG tablet   Other Relevant Orders   Renal Function Panel     Endocrine   Diabetes mellitus type 2, controlled, without complications (HCC)   Relevant Medications   atorvastatin (LIPITOR) 20 MG tablet   lisinopril (PRINIVIL,ZESTRIL) 10 MG tablet   metFORMIN (GLUCOPHAGE) 500 MG tablet   Other Relevant Orders   Microalbumin / creatinine urine ratio   Hemoglobin A1c   Renal Function Panel     Other   Hyperlipidemia   Relevant Medications   atorvastatin (LIPITOR) 20 MG tablet   lisinopril (PRINIVIL,ZESTRIL) 10 MG tablet    Other Visit Diagnoses    Taking medication for chronic disease        Relevant Orders    Hepatic function panel    Acute Hep Panel & Hep B Surface Ab    Exposure to hepatitis        Relevant Orders    Hepatic function panel    Acute Hep Panel & Hep B Surface Ab         Dr. [facility]  06/16/2016

## 2016-06-17 LAB — RENAL FUNCTION PANEL
ALBUMIN: 4.4 g/dL (ref 3.5–5.5)
BUN / CREAT RATIO: 14 (ref 9–20)
BUN: 20 mg/dL (ref 6–24)
CALCIUM: 9.7 mg/dL (ref 8.7–10.2)
CO2: 22 mmol/L (ref 18–29)
Chloride: 98 mmol/L (ref 96–106)
Creatinine, Ser: 1.39 mg/dL — ABNORMAL HIGH (ref 0.76–1.27)
GFR, EST AFRICAN AMERICAN: 68 mL/min/{1.73_m2} (ref >59)
GFR, EST NON AFRICAN AMERICAN: 59 mL/min/{1.73_m2} — AB (ref >59)
GLUCOSE: 104 mg/dL — AB (ref 65–99)
PHOSPHORUS: 2.4 mg/dL — AB (ref 2.5–4.5)
POTASSIUM: 4.2 mmol/L (ref 3.5–5.2)
SODIUM: 140 mmol/L (ref 134–144)

## 2016-06-17 LAB — MICROALBUMIN / CREATININE URINE RATIO
Creatinine, Urine: 125 mg/dL
MICROALB/CREAT RATIO: 3.4 mg/g{creat} (ref 0.0–30.0)
Microalbumin, Urine: 4.2 ug/mL

## 2016-06-17 LAB — HEPATIC FUNCTION PANEL
ALK PHOS: 70 IU/L (ref 39–117)
ALT: 20 IU/L (ref 0–44)
AST: 15 IU/L (ref 0–40)
BILIRUBIN TOTAL: 1.2 mg/dL (ref 0.0–1.2)
BILIRUBIN, DIRECT: 0.24 mg/dL (ref 0.00–0.40)
TOTAL PROTEIN: 6.9 g/dL (ref 6.0–8.5)

## 2016-06-17 LAB — HEMOGLOBIN A1C
Est. average glucose Bld gHb Est-mCnc: 140 mg/dL
Hgb A1c MFr Bld: 6.5 % — ABNORMAL HIGH (ref 4.8–5.6)

## 2016-06-24 DIAGNOSIS — M722 Plantar fascial fibromatosis: Secondary | ICD-10-CM

## 2016-06-27 ENCOUNTER — Telehealth: Payer: Self-pay | Admitting: *Deleted

## 2016-06-27 NOTE — Telephone Encounter (Signed)
Called patient and patient stated that we were suppose to call insurance to see if the Physical Therapy was going to be covered and I stated we would check into it . Brian Butler

## 2016-07-02 ENCOUNTER — Telehealth: Payer: Self-pay | Admitting: *Deleted

## 2016-07-02 ENCOUNTER — Other Ambulatory Visit: Payer: Self-pay

## 2016-07-02 DIAGNOSIS — M722 Plantar fascial fibromatosis: Secondary | ICD-10-CM

## 2016-07-02 NOTE — Telephone Encounter (Addendum)
-----   Message from Lanney Gins, [facility] sent at 06/27/2016  4:03 PM EDT ----- Brian Butler, patient stated that someone was to be calling his insurance to see if physical therapy was going to be covered by his insurance. [phone] [name]. 07/02/2016-Left message informing pt that PT facilities pre-cert and check cost to pt. Pt called states he didn't understand about the pre-cert, and he had never been to a PT in his life.  I told pt, we sent orders to PT and they called the pt to schedule and discuss coverage. I asked pt if he had a preference and he stated he didn't know any PT.  I told him we used [facility] quite a bit and I would refer him there. Faxed required [facility] form. Referral, LOV and pt demographics.

## 2016-08-05 ENCOUNTER — Other Ambulatory Visit: Payer: Self-pay

## 2016-08-07 ENCOUNTER — Encounter: Payer: Self-pay | Admitting: Internal Medicine

## 2016-08-07 ENCOUNTER — Ambulatory Visit (INDEPENDENT_AMBULATORY_CARE_PROVIDER_SITE_OTHER): Payer: 59 | Admitting: Internal Medicine

## 2016-08-07 VITALS — BP 142/90 | HR 72 | Resp 16 | Ht 72.0 in | Wt 210.0 lb

## 2016-08-07 DIAGNOSIS — M6248 Contracture of muscle, other site: Secondary | ICD-10-CM | POA: Diagnosis not present

## 2016-08-07 DIAGNOSIS — M722 Plantar fascial fibromatosis: Secondary | ICD-10-CM

## 2016-08-07 DIAGNOSIS — E1142 Type 2 diabetes mellitus with diabetic polyneuropathy: Secondary | ICD-10-CM

## 2016-08-07 DIAGNOSIS — E785 Hyperlipidemia, unspecified: Secondary | ICD-10-CM

## 2016-08-07 DIAGNOSIS — I10 Essential (primary) hypertension: Secondary | ICD-10-CM | POA: Diagnosis not present

## 2016-08-07 DIAGNOSIS — M62838 Other muscle spasm: Secondary | ICD-10-CM | POA: Insufficient documentation

## 2016-08-07 DIAGNOSIS — L819 Disorder of pigmentation, unspecified: Secondary | ICD-10-CM

## 2016-08-07 DIAGNOSIS — M7522 Bicipital tendinitis, left shoulder: Secondary | ICD-10-CM | POA: Diagnosis not present

## 2016-08-07 MED ORDER — ATORVASTATIN CALCIUM 10 MG PO TABS: 10.0000 mg | ORAL_TABLET | Freq: Every day | ORAL | 3 refills | Status: DC

## 2016-08-07 MED ORDER — MELOXICAM 15 MG PO TABS
15.0000 mg | ORAL_TABLET | Freq: Every day | ORAL | 0 refills | Status: DC
Start: 2016-08-07 — End: 2017-09-11

## 2016-08-07 MED ORDER — GABAPENTIN 300 MG PO CAPS: 300.0000 mg | ORAL_CAPSULE | Freq: Three times a day (TID) | ORAL | 0 refills | Status: DC

## 2016-08-07 MED ORDER — ATORVASTATIN CALCIUM 10 MG PO TABS: 10.0000 mg | ORAL_TABLET | Freq: Every day | ORAL | 3 refills | Status: AC

## 2016-08-07 NOTE — Progress Notes (Signed)
Date:  08/07/2016   Name:  Brian Butler   DOB:  04/24/1966   MRN:  [id number]   Chief Complaint: Joint Pain (Left Arm severe but hurts all over. Feet severe and shoulders. Did Xrays 2-3 years ago and nothing was found. ); Testosterone (low for years); Cyst (Left arm- comes and goes he pops it off then it comes back. ); and Hyperlipidemia (Been off Atorvastatin 30 days or more said he ran out and no one gave him more. )  Hyperlipidemia  This is a chronic problem. The problem is uncontrolled. Recent lipid tests were reviewed and are high. Associated symptoms include myalgias. Pertinent negatives include no chest pain or shortness of breath. He is currently on no antihyperlipidemic treatment (was on lipitor but ran out of RF and did not call).   Arthralgias - pain in neck (s/p ant cervical fusion), left elbow, both knees and both feet (plantar fasciitis). 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 He's had an anterior cervical fusion and has discomfort in his neck and upper and shoulder muscles chronically. He also has discomfort in his left forearm near the elbow with movement. He has chronic plantar fasciitis for which he has seen podiatry. He's had numerous cortisone injections which have helped somewhat on the right but not on the left. He walks with a cane. He has trouble sleeping because he so uncomfortable. He's taken narcotics in the past and is very concerned about becoming dependent on medication. Currently he is taking no anti-inflammatory medication of any type.  Skin lesion - patient states some chemical liquid splashed onto his penis about 6 months ago. The liquid actually ate through his pants and shorts. Now he has a raised slightly pigmented soft fleshy lesion that is concerning to him. It does not cause any pain or itching. It is not changing in size. He also has a firm nodule on his left forearm that's been there for some time.  He's tried to unroof it but only removes surface skin. It may have started as a mosquito or bee sting.  Review of Systems  Constitutional: Positive for fatigue. Negative for chills, fever and unexpected weight change.  Respiratory: Negative for cough, chest tightness and shortness of breath.   Cardiovascular: Negative for chest pain, palpitations and leg swelling.  Gastrointestinal: Negative for abdominal pain.  Genitourinary: Negative for penile pain and testicular pain.  Musculoskeletal: Positive for arthralgias, back pain, gait problem, myalgias, neck pain and neck stiffness.  Neurological: Positive for tremors (in arms when he tried to raise them above his head) and numbness (tingling in feet and legs at night). Negative for dizziness, syncope and headaches.  Hematological: Negative for adenopathy.  Psychiatric/Behavioral: Positive for dysphoric mood and sleep disturbance.    Patient Active Problem List   Diagnosis Date Noted  . Biceps tendonitis on left 08/07/2016  . Pigmented skin lesion of uncertain nature 08/07/2016  . Neck muscle spasm 08/07/2016  . Plantar fasciitis 05/30/2016  . Chronic heel pain 05/30/2016  . Hyperlipidemia 04/30/2016  . Chronic low back pain 04/30/2016  . Overweight (BMI 25.0-29.9) 04/30/2016  . Abnormal nuclear cardiac imaging test 04/23/2016  . Unstable angina (HCC)   . Hypertension 09/05/2015  . Nephrolithiasis 09/05/2015  . Hx of hypotestosteronemia 09/05/2015  . Diabetes mellitus type 2, controlled, without complications (HCC) 09/05/2015    Prior to Admission medications   Medication Sig Start Date End Date Taking? Authorizing Provider  aspirin  325 MG EC tablet Take 1 tablet (325 mg total) by mouth daily. 04/23/16  Yes Dorothea Ogle, MD  lisinopril (PRINIVIL,ZESTRIL) 10 MG tablet Take 1 tablet (10 mg total) by mouth daily. 06/16/16  Yes Duanne Limerick, MD  metFORMIN (GLUCOPHAGE) 500 MG tablet Take 1 tablet (500 mg total) by mouth 2 (two) times  daily with a meal. 06/16/16  Yes Duanne Limerick, MD    No Known Allergies  Past Surgical History:  Procedure Laterality Date  . ANTERIOR CERVICAL DECOMP/DISCECTOMY FUSION  2006  . CARDIAC CATHETERIZATION N/A 04/23/2016   Procedure: Left Heart Cath and Coronary Angiography;  Surgeon: Lyn Records, MD;  Location: [facility] INVASIVE CV LAB;  Service: Cardiovascular;  Laterality: N/A;  . CYSTOSCOPY W/ RETROGRADES Left 01/16/2016   Procedure: CYSTOSCOPY WITH RETROGRADE PYELOGRAM;  Surgeon: Hildred Laser, MD;  Location: [facility];  Service: Urology;  Laterality: Left;  . CYSTOSCOPY WITH STENT PLACEMENT Left 12/05/2015   Procedure: CYSTOSCOPY WITH STENT PLACEMENT;  Surgeon: Hildred Laser, MD;  Location: [facility];  Service: Urology;  Laterality: Left;  . CYSTOSCOPY WITH STENT PLACEMENT Left 01/16/2016   Procedure: CYSTOSCOPY WITH STENT PLACEMENT/ EXCHANGE;  Surgeon: Hildred Laser, MD;  Location: [facility];  Service: Urology;  Laterality: Left;  . EXTRACORPOREAL SHOCK WAVE LITHOTRIPSY Left 09/20/2015   Procedure: EXTRACORPOREAL SHOCK WAVE LITHOTRIPSY (ESWL);  Surgeon: Hildred Laser, MD;  Location: [facility];  Service: Urology;  Laterality: Left;  . EXTRACORPOREAL SHOCK WAVE LITHOTRIPSY Left 11/15/2015   Procedure: EXTRACORPOREAL SHOCK WAVE LITHOTRIPSY (ESWL);  Surgeon: Vanna Scotland, MD;  Location: [facility];  Service: Urology;  Laterality: Left;  . URETEROSCOPY WITH HOLMIUM LASER LITHOTRIPSY Left 12/05/2015   Procedure: URETEROSCOPY WITH HOLMIUM LASER LITHOTRIPSY;  Surgeon: Hildred Laser, MD;  Location: [facility];  Service: Urology;  Laterality: Left;  . URETEROSCOPY WITH HOLMIUM LASER LITHOTRIPSY Left 01/16/2016   Procedure: URETEROSCOPY WITH HOLMIUM LASER LITHOTRIPSY;  Surgeon: Hildred Laser, MD;  Location: [facility];  Service: Urology;  Laterality: Left;    Social History  Substance Use Topics  . Smoking status: Never Smoker  . Smokeless tobacco: Never Used  . Alcohol use No      Medication list has been reviewed and updated.   Physical Exam  Constitutional: He is oriented to person, place, and time. He appears well-developed. He appears distressed.  HENT:  Head: Normocephalic and atraumatic.  Neck: Spinous process tenderness and muscular tenderness present. Carotid bruit is not present. Decreased range of motion present.  Cardiovascular: Normal rate, regular rhythm and normal heart sounds.   Pulmonary/Chest: Effort normal and breath sounds normal. No respiratory distress. He has no decreased breath sounds.  Musculoskeletal:       Left elbow: He exhibits no swelling and no effusion. Tenderness (over distal biceps tendon) found.       Cervical back: He exhibits decreased range of motion, tenderness and spasm.  Neurological: He is alert and oriented to person, place, and time.  Skin: Skin is warm and dry. No rash noted.  3 mm raised soft flesh colored lesion on dorsal penile shaft  Nodule 3 mm on left forearm c/w fibroma  Psychiatric: His speech is normal and behavior is normal. Thought content normal. His mood appears anxious. He exhibits a depressed mood.  Nursing note and vitals reviewed.   BP (!) 142/90 (BP Location: Left Arm, Patient Position: Sitting, Cuff Size: Normal)   Pulse 72   Resp 16   Ht 6' (1.829 m)  Wt 210 lb (95.3 kg)   SpO2 98%   BMI 28.48 kg/m   Assessment and Plan: 1. Essential hypertension controlled  2. Controlled type 2 diabetes mellitus without complication, without long-term current use of insulin (HCC) Mild neuropathic sx - Last A1C good - continue metformin - gabapentin (NEURONTIN) 300 MG capsule; Take 1 capsule (300 mg total) by mouth 3 (three) times daily.  Dispense: 90 capsule; Refill: 0  3. Plantar fasciitis Begin daily anti-inflammatory Consider adding muscle relaxant next visit - meloxicam (MOBIC) 15 MG tablet; Take 1 tablet (15 mg total) by mouth daily.  Dispense: 90 tablet; Refill: 0  4. Biceps tendonitis  on left Should improve with mobic - meloxicam (MOBIC) 15 MG tablet; Take 1 tablet (15 mg total) by mouth daily.  Dispense: 90 tablet; Refill: 0  5. Hyperlipidemia Resume lower dose statin - atorvastatin (LIPITOR) 10 MG tablet; Take 1 tablet (10 mg total) by mouth daily at 6 PM.  Dispense: 90 tablet; Refill: 3  6. Neck muscle spasm  7. Pigmented skin lesion of uncertain nature - Ambulatory referral to Dermatology   Bari EdwardLaura Ricarda Atayde, MD [facility]  08/07/2016

## 2016-08-07 NOTE — Patient Instructions (Addendum)
Plantar Fasciitis Plantar fasciitis is a painful foot condition that affects the heel. It occurs when the band of tissue that connects the toes to the heel bone (plantar fascia) becomes irritated. This can happen after exercising too much or doing other repetitive activities (overuse injury). The pain from plantar fasciitis can range from mild irritation to severe pain that makes it difficult for you to walk or move. The pain is usually worse in the morning or after you have been sitting or lying down for a while. CAUSES This condition may be caused by:  Standing for long periods of time.  Wearing shoes that do not fit.  Doing high-impact activities, including running, aerobics, and ballet.  Being overweight.  Having an abnormal way of walking (gait).  Having tight calf muscles.  Having high arches in your feet.  Starting a new athletic activity. SYMPTOMS The main symptom of this condition is heel pain. Other symptoms include:  Pain that gets worse after activity or exercise.  Pain that is worse in the morning or after resting.  Pain that goes away after you walk for a few minutes. DIAGNOSIS This condition may be diagnosed based on your signs and symptoms. Your health care provider will also do a physical exam to check for:  A tender area on the bottom of your foot.  A high arch in your foot.  Pain when you move your foot.  Difficulty moving your foot. You may also need to have imaging studies to confirm the diagnosis. These can include:  X-rays.  Ultrasound.  MRI. TREATMENT  Treatment for plantar fasciitis depends on the severity of the condition. Your treatment may include:  Rest, ice, and over-the-counter pain medicines to manage your pain.  Exercises to stretch your calves and your plantar fascia.  A splint that holds your foot in a stretched, upward position while you sleep (night splint).  Physical therapy to relieve symptoms and prevent problems in the  future.  Cortisone injections to relieve severe pain.  Extracorporeal shock wave therapy (ESWT) to stimulate damaged plantar fascia with electrical impulses. It is often used as a last resort before surgery.  Surgery, if other treatments have not worked after 12 months. HOME CARE INSTRUCTIONS  Take medicines only as directed by your health care provider.  Avoid activities that cause pain.  Roll the bottom of your foot over a bag of ice or a bottle of cold water. Do this for 20 minutes, 3-4 times a day.  Perform simple stretches as directed by your health care provider.  Try wearing athletic shoes with air-sole or gel-sole cushions or soft shoe inserts.  Wear a night splint while sleeping, if directed by your health care provider.  Keep all follow-up appointments with your health care provider. PREVENTION   Do not perform exercises or activities that cause heel pain.  Consider finding low-impact activities if you continue to have problems.  Lose weight if you need to. The best way to prevent plantar fasciitis is to avoid the activities that aggravate your plantar fascia. SEEK MEDICAL CARE IF:  Your symptoms do not go away after treatment with home care measures.  Your pain gets worse.  Your pain affects your ability to move or do your daily activities.   This information is not intended to replace advice given to you by your health care provider. Make sure you discuss any questions you have with your health care provider.   Document Released: 08/12/2001 Document Revised: 08/08/2015 Document Reviewed: 09/27/2014 Elsevier   Interactive Patient Education 2016 [employer]  

## 2016-08-18 DIAGNOSIS — Z789 Other specified health status: Secondary | ICD-10-CM | POA: Diagnosis not present

## 2016-08-18 DIAGNOSIS — R238 Other skin changes: Secondary | ICD-10-CM | POA: Diagnosis not present

## 2016-08-18 DIAGNOSIS — A630 Anogenital (venereal) warts: Secondary | ICD-10-CM | POA: Diagnosis not present

## 2016-08-18 DIAGNOSIS — R208 Other disturbances of skin sensation: Secondary | ICD-10-CM | POA: Diagnosis not present

## 2016-08-18 DIAGNOSIS — L570 Actinic keratosis: Secondary | ICD-10-CM | POA: Diagnosis not present

## 2016-09-02 ENCOUNTER — Ambulatory Visit: Payer: 59 | Admitting: Family Medicine

## 2016-09-04 ENCOUNTER — Ambulatory Visit: Payer: 59 | Admitting: Family Medicine

## 2016-09-04 DIAGNOSIS — M722 Plantar fascial fibromatosis: Secondary | ICD-10-CM

## 2016-09-14 ENCOUNTER — Encounter: Payer: Self-pay | Admitting: Gynecology

## 2016-09-14 ENCOUNTER — Ambulatory Visit
Admission: EM | Admit: 2016-09-14 | Discharge: 2016-09-14 | Disposition: A | Discharge status: Home / Self Care | Payer: 59 | Attending: Family Medicine | Admitting: Family Medicine

## 2016-09-14 DIAGNOSIS — Z87442 Personal history of urinary calculi: Secondary | ICD-10-CM | POA: Diagnosis not present

## 2016-09-14 DIAGNOSIS — R1032 Left lower quadrant pain: Secondary | ICD-10-CM

## 2016-09-14 LAB — URINALYSIS COMPLETE WITH MICROSCOPIC (ARMC ONLY)
BACTERIA UA: NONE SEEN
BILIRUBIN URINE: NEGATIVE
Glucose, UA: 250 mg/dL — AB
Hgb urine dipstick: NEGATIVE
KETONES UR: NEGATIVE mg/dL
LEUKOCYTES UA: NEGATIVE
NITRITE: NEGATIVE
PH: 5 (ref 5.0–8.0)
Specific Gravity, Urine: >1.03 — ABNORMAL HIGH (ref 1.005–1.030)
Squamous Epithelial / LPF: NONE SEEN

## 2016-09-14 MED ORDER — HYDROCODONE-ACETAMINOPHEN 5-325 MG PO TABS: 1.0000 | ORAL_TABLET | Freq: Four times a day (QID) | ORAL | 0 refills | Status: DC | PRN

## 2016-09-14 NOTE — ED Provider Notes (Signed)
[facility]    CSN: [id number] Arrival date & time: 09/14/16  1301     History   Chief Complaint Chief Complaint  Patient presents with  . Abdominal Pain    HPI Brian Butler is a 50 y.o. male.   50 yo male with a c/o 1 week of intermittent left lower quadrant pain /grion area and  frequent urination. Denies any fevers, chills, vomiting, dysuria, hematuria.    The history is provided by the patient.    Past Medical History:  Diagnosis Date  . Chest pain    a. 03/2016 Myoview: medium defect of mild severity in basal anteroseptal, mid anteroseptal, and apical septal locations - partially reversible;  b. Cath: nl cors, EF 55-65%. EDP 16 mmHg.  Marland Kitchen Chronic back pain   . CKD (chronic kidney disease), stage III    [name] 04/21/2016  . FH: stroke 09/05/2015  . High cholesterol   . Hx of hypotestosteronemia 09/05/2015  . Hyperglycemia 09/05/2015  . Hypertension 09/05/2015  . Nephrolithiasis "several times"  . Plantar fasciitis, bilateral   . Uncontrolled type II diabetes mellitus [facility])     Patient Active Problem List   Diagnosis Date Noted  . Type 2 diabetes mellitus with stage 3 chronic kidney disease, without long-term current use of insulin (HCC) 09/18/2016  . Biceps tendonitis on left 08/07/2016  . Pigmented skin lesion of uncertain nature 08/07/2016  . Neck muscle spasm 08/07/2016  . DM type 2 with diabetic peripheral neuropathy (HCC) 08/07/2016  . Plantar fasciitis 05/30/2016  . Chronic heel pain 05/30/2016  . Hyperlipidemia associated with type 2 diabetes mellitus (HCC) 04/30/2016  . Chronic low back pain 04/30/2016  . Overweight (BMI 25.0-29.9) 04/30/2016  . Abnormal nuclear cardiac imaging test 04/23/2016  . Unstable angina (HCC)   . Essential hypertension 09/05/2015  . Nephrolithiasis 09/05/2015  . Hx of hypotestosteronemia 09/05/2015    Past Surgical History:  Procedure Laterality Date  . ANTERIOR CERVICAL DECOMP/DISCECTOMY FUSION  2006  .  CARDIAC CATHETERIZATION N/A 04/23/2016   Procedure: Left Heart Cath and Coronary Angiography;  Surgeon: Lyn Records, MD;  [facility] INVASIVE CV LAB;  Service: Cardiovascular;  Laterality: N/A;  . CYSTOSCOPY W/ RETROGRADES Left 01/16/2016   Procedure: CYSTOSCOPY WITH RETROGRADE PYELOGRAM;  Surgeon: Hildred Laser, MD;  Location: [facility] ORS;  Service: Urology;  Laterality: Left;  . CYSTOSCOPY WITH STENT PLACEMENT Left 12/05/2015   Procedure: CYSTOSCOPY WITH STENT PLACEMENT;  Surgeon: Hildred Laser, MD;  Location: [facility] ORS;  Service: Urology;  Laterality: Left;  . CYSTOSCOPY WITH STENT PLACEMENT Left 01/16/2016   Procedure: CYSTOSCOPY WITH STENT PLACEMENT/ EXCHANGE;  Surgeon: Hildred Laser, MD;  Location: [facility] ORS;  Service: Urology;  Laterality: Left;  . EXTRACORPOREAL SHOCK WAVE LITHOTRIPSY Left 09/20/2015   Procedure: EXTRACORPOREAL SHOCK WAVE LITHOTRIPSY (ESWL);  Surgeon: Hildred Laser, MD;  Location: [facility] ORS;  Service: Urology;  Laterality: Left;  . EXTRACORPOREAL SHOCK WAVE LITHOTRIPSY Left 11/15/2015   Procedure: EXTRACORPOREAL SHOCK WAVE LITHOTRIPSY (ESWL);  Surgeon: Vanna Scotland, MD;  Location: [facility] ORS;  Service: Urology;  Laterality: Left;  . URETEROSCOPY WITH HOLMIUM LASER LITHOTRIPSY Left 12/05/2015   Procedure: URETEROSCOPY WITH HOLMIUM LASER LITHOTRIPSY;  Surgeon: Hildred Laser, MD;  Location: [facility] ORS;  Service: Urology;  Laterality: Left;  . URETEROSCOPY WITH HOLMIUM LASER LITHOTRIPSY Left 01/16/2016   Procedure: URETEROSCOPY WITH HOLMIUM LASER LITHOTRIPSY;  Surgeon: Hildred Laser, MD;  Location: [facility];  Service: Urology;  Laterality: Left;  Home Medications    Prior to Admission medications   Medication Sig Start Date End Date Taking? Authorizing Provider  aspirin 325 MG EC tablet Take 1 tablet (325 mg total) by mouth daily. 04/23/16  Yes Dorothea Ogle, MD  atorvastatin (LIPITOR) 10 MG tablet Take 1 tablet (10 mg total) by mouth daily at 6  PM. 08/07/16  Yes Reubin Milan, MD  lisinopril (PRINIVIL,ZESTRIL) 10 MG tablet Take 1 tablet (10 mg total) by mouth daily. 06/16/16  Yes Duanne Limerick, MD  meloxicam (MOBIC) 15 MG tablet Take 1 tablet (15 mg total) by mouth daily. 08/07/16  Yes Reubin Milan, MD  metFORMIN (GLUCOPHAGE) 500 MG tablet Take 1 tablet (500 mg total) by mouth 2 (two) times daily with a meal. 06/16/16  Yes Duanne Limerick, MD  gabapentin (NEURONTIN) 300 MG capsule Take 1 capsule (300 mg total) by mouth 2 (two) times daily. And take 2 at bedtime 09/23/16   Reubin Milan, MD  HYDROcodone-acetaminophen (NORCO/VICODIN) 5-325 MG tablet Take 1-2 tablets by mouth every 6 (six) hours as needed. 09/14/16   Payton Mccallum, MD    Family History Family History  Problem Relation Age of Onset  . Stroke Father   . Nephrolithiasis Father   . Prostate cancer Neg Hx   . Bladder Cancer Neg Hx     Social History Social History  Substance Use Topics  . Smoking status: Never Smoker  . Smokeless tobacco: Never Used  . Alcohol use No     Allergies   Review of patient's allergies indicates no known allergies.   Review of Systems Review of Systems   Physical Exam Triage Vital Signs ED Triage Vitals  Enc Vitals Group     BP 09/14/16 1325 (!) 156/75     Pulse Rate 09/14/16 1325 77     Resp 09/14/16 1325 16     Temp 09/14/16 1325 98.3 F (36.8 C)     Temp Source 09/14/16 1325 Oral     SpO2 09/14/16 1325 99 %     Weight 09/14/16 1327 210 lb (95.3 kg)     Height 09/14/16 1327 6' (1.829 m)     Head Circumference --      Peak Flow --      Pain Score 09/14/16 1329 4     Pain Loc --      Pain Edu? --      Excl. in GC? --    No data found.   Updated Vital Signs BP (!) 156/75 (BP Location: Left Arm)   Pulse 77   Temp 98.3 F (36.8 C) (Oral)   Resp 16   Ht 6' (1.829 m)   Wt 210 lb (95.3 kg)   SpO2 99%   BMI 28.48 kg/m   Visual Acuity Right Eye Distance:   Left Eye Distance:   Bilateral Distance:     Right Eye Near:   Left Eye Near:    Bilateral Near:     Physical Exam  Constitutional: He appears well-developed and well-nourished. No distress.  Abdominal: Soft. Bowel sounds are normal. He exhibits no distension and no mass. There is tenderness (left lower pelvic/inguinal pain). There is no rebound and no guarding. No hernia.  Skin: He is not diaphoretic.  Nursing note and vitals reviewed.    UC Treatments / Results  Labs (all labs ordered are listed, but only abnormal results are displayed) Labs Reviewed  URINALYSIS COMPLETEWITH MICROSCOPIC ([facility] ONLY) - Abnormal; Notable for the following:  Result Value   Color, Urine AMBER (*)    Glucose, UA 250 (*)    Specific Gravity, Urine >1.030 (*)    Protein, ur TRACE (*)    All other components within normal limits    EKG  EKG Interpretation None       Radiology No results found.  Procedures Procedures (including critical care time)  Medications Ordered in UC Medications - No data to display   Initial Impression / Assessment and Plan / UC Course  I have reviewed the triage vital signs and the nursing notes.  Pertinent labs & imaging results that were available during my care of the patient were reviewed by me and considered in my medical decision making (see chart for details).  Clinical Course      Final Clinical Impressions(s) / UC Diagnoses   Final diagnoses:  Left inguinal pain  History of nephrolithiasis    New Prescriptions Discharge Medication List as of 09/14/2016  2:00 PM    START taking these medications   Details  HYDROcodone-acetaminophen (NORCO/VICODIN) 5-325 MG tablet Take 1-2 tablets by mouth every 6 (six) hours as needed., Starting Sun 09/14/2016, Print       1. Lab results and diagnosis reviewed with patient 2. rx as per orders above; reviewed possible side effects, interactions, risks and benefits  3. Recommend supportive treatment with increased fluids 4. Follow-up prn if  symptoms worsen or don't improve   Payton Mccallumrlando Ethie Curless, MD 09/24/16 2103

## 2016-09-14 NOTE — Discharge Instructions (Signed)
Increase water intake Follow up with urologist or PCP

## 2016-09-14 NOTE — ED Triage Notes (Signed)
Per patient x 1 week lower left quadrant pain /grion area with frequency urination.

## 2016-09-15 ENCOUNTER — Ambulatory Visit: Payer: 59 | Admitting: Internal Medicine

## 2016-09-18 ENCOUNTER — Ambulatory Visit: Payer: 59 | Admitting: Internal Medicine

## 2016-09-18 ENCOUNTER — Encounter: Payer: Self-pay | Admitting: Internal Medicine

## 2016-09-18 DIAGNOSIS — E119 Type 2 diabetes mellitus without complications: Secondary | ICD-10-CM | POA: Insufficient documentation

## 2016-09-23 ENCOUNTER — Encounter: Payer: Self-pay | Admitting: Internal Medicine

## 2016-09-23 ENCOUNTER — Ambulatory Visit (INDEPENDENT_AMBULATORY_CARE_PROVIDER_SITE_OTHER): Payer: 59 | Admitting: Internal Medicine

## 2016-09-23 VITALS — BP 115/78 | HR 72 | Temp 98.2°F | Resp 16 | Ht 72.0 in | Wt 212.0 lb

## 2016-09-23 DIAGNOSIS — I10 Essential (primary) hypertension: Secondary | ICD-10-CM

## 2016-09-23 DIAGNOSIS — E1142 Type 2 diabetes mellitus with diabetic polyneuropathy: Secondary | ICD-10-CM

## 2016-09-23 DIAGNOSIS — E785 Hyperlipidemia, unspecified: Secondary | ICD-10-CM | POA: Diagnosis not present

## 2016-09-23 DIAGNOSIS — N183 Chronic kidney disease, stage 3 unspecified: Secondary | ICD-10-CM

## 2016-09-23 DIAGNOSIS — E1122 Type 2 diabetes mellitus with diabetic chronic kidney disease: Secondary | ICD-10-CM

## 2016-09-23 DIAGNOSIS — M722 Plantar fascial fibromatosis: Secondary | ICD-10-CM | POA: Diagnosis not present

## 2016-09-23 DIAGNOSIS — E1169 Type 2 diabetes mellitus with other specified complication: Secondary | ICD-10-CM

## 2016-09-23 MED ORDER — GABAPENTIN 300 MG PO CAPS: 300.0000 mg | ORAL_CAPSULE | Freq: Two times a day (BID) | ORAL | 1 refills | Status: DC

## 2016-09-23 NOTE — Progress Notes (Signed)
Date:  09/23/2016   Name:  Brian Butler   DOB:  [birth year]   MRN:  [id number]   Chief Complaint: Hypertension (Follow Up); Groin Pain (Taking Azo-MUC dx with crystals and gave pain meds and also made appt with Urology for tomorrow. ); and Arthritis (refill meds) Hypertension  This is a chronic problem. The current episode started more than 1 year ago. The problem is unchanged. The problem is controlled. Associated symptoms include neck pain. Pertinent negatives include no chest pain, headaches, palpitations or shortness of breath.  Diabetes  He presents for his follow-up diabetic visit. He has type 2 diabetes mellitus. His disease course has been stable. Hypoglycemia symptoms include tremors (in arms when he tried to raise them above his head). Pertinent negatives for hypoglycemia include no dizziness or headaches. Associated symptoms include fatigue. Pertinent negatives for diabetes include no chest pain. Current diabetic treatment includes oral agent (monotherapy). He is compliant with treatment most of the time. His breakfast blood glucose is taken between 7-8 am. His breakfast blood glucose range is generally 110-130 mg/dl.  Hyperlipidemia  This is a chronic problem. Associated symptoms include myalgias. Pertinent negatives include no chest pain or shortness of breath. Current antihyperlipidemic treatment includes statins (restarted last visit).  Neuropathy - improved some on gabapentin and helped with sleep. He has been a bit somnolent during the day but does not work.  He is also taking Mobic but unsure of benefit. Kidney stones - having left pelvic pain - seen in UC with crystals in urine and has appt tomorrow with Urology.  He is taking hydrocodone from UC.    Review of Systems  Constitutional: Positive for fatigue. Negative for chills, fever and unexpected weight change.  Respiratory: Negative for cough, chest tightness and shortness of breath.   Cardiovascular: Negative for chest  pain, palpitations and leg swelling.  Gastrointestinal: Negative for abdominal pain.  Genitourinary: Negative for penile pain and testicular pain.       Left groin pain  Musculoskeletal: Positive for arthralgias, back pain, gait problem, myalgias, neck pain and neck stiffness.  Neurological: Positive for tremors (in arms when he tried to raise them above his head) and numbness (tingling in feet and legs at night). Negative for dizziness, syncope and headaches.  Hematological: Negative for adenopathy.  Psychiatric/Behavioral: Positive for dysphoric mood. Sleep disturbance: improved.    Patient Active Problem List   Diagnosis Date Noted  . Type 2 diabetes mellitus with stage 3 chronic kidney disease, without long-term current use of insulin (HCC) 09/18/2016  . Biceps tendonitis on left 08/07/2016  . Pigmented skin lesion of uncertain nature 08/07/2016  . Neck muscle spasm 08/07/2016  . DM type 2 with diabetic peripheral neuropathy (HCC) 08/07/2016  . Plantar fasciitis 05/30/2016  . Chronic heel pain 05/30/2016  . Hyperlipidemia associated with type 2 diabetes mellitus (HCC) 04/30/2016  . Chronic low back pain 04/30/2016  . Overweight (BMI 25.0-29.9) 04/30/2016  . Abnormal nuclear cardiac imaging test 04/23/2016  . Unstable angina (HCC)   . Essential hypertension 09/05/2015  . Nephrolithiasis 09/05/2015  . Hx of hypotestosteronemia 09/05/2015    Prior to Admission medications   Medication Sig Start Date End Date Taking? Authorizing Provider  aspirin 325 MG EC tablet Take 1 tablet (325 mg total) by mouth daily. 04/23/16  Yes Dorothea Ogle, MD  atorvastatin (LIPITOR) 10 MG tablet Take 1 tablet (10 mg total) by mouth daily at 6 PM. 08/07/16  Yes Reubin Milan, MD  gabapentin (  NEURONTIN) 300 MG capsule Take 1 capsule (300 mg total) by mouth 3 (three) times daily. 08/07/16  Yes Reubin Milan, MD  HYDROcodone-acetaminophen (NORCO/VICODIN) 5-325 MG tablet Take 1-2 tablets by mouth every 6  (six) hours as needed. 09/14/16  Yes Payton Mccallum, MD  lisinopril (PRINIVIL,ZESTRIL) 10 MG tablet Take 1 tablet (10 mg total) by mouth daily. 06/16/16  Yes Duanne Limerick, MD  meloxicam (MOBIC) 15 MG tablet Take 1 tablet (15 mg total) by mouth daily. 08/07/16  Yes Reubin Milan, MD  metFORMIN (GLUCOPHAGE) 500 MG tablet Take 1 tablet (500 mg total) by mouth 2 (two) times daily with a meal. 06/16/16  Yes Duanne Limerick, MD    No Known Allergies  Past Surgical History:  Procedure Laterality Date  . ANTERIOR CERVICAL DECOMP/DISCECTOMY FUSION  2006  . CARDIAC CATHETERIZATION N/A 04/23/2016   Procedure: Left Heart Cath and Coronary Angiography;  Surgeon: Lyn Records, MD;  Location: [facility] INVASIVE CV LAB;  Service: Cardiovascular;  Laterality: N/A;  . CYSTOSCOPY W/ RETROGRADES Left 01/16/2016   Procedure: CYSTOSCOPY WITH RETROGRADE PYELOGRAM;  Surgeon: Hildred Laser, MD;  Location: [facility];  Service: Urology;  Laterality: Left;  . CYSTOSCOPY WITH STENT PLACEMENT Left 12/05/2015   Procedure: CYSTOSCOPY WITH STENT PLACEMENT;  Surgeon: Hildred Laser, MD;  Location: [facility];  Service: Urology;  Laterality: Left;  . CYSTOSCOPY WITH STENT PLACEMENT Left 01/16/2016   Procedure: CYSTOSCOPY WITH STENT PLACEMENT/ EXCHANGE;  Surgeon: Hildred Laser, MD;  Location: [facility];  Service: Urology;  Laterality: Left;  . EXTRACORPOREAL SHOCK WAVE LITHOTRIPSY Left 09/20/2015   Procedure: EXTRACORPOREAL SHOCK WAVE LITHOTRIPSY (ESWL);  Surgeon: Hildred Laser, MD;  Location: [facility];  Service: Urology;  Laterality: Left;  . EXTRACORPOREAL SHOCK WAVE LITHOTRIPSY Left 11/15/2015   Procedure: EXTRACORPOREAL SHOCK WAVE LITHOTRIPSY (ESWL);  Surgeon: Vanna Scotland, MD;  Location: [facility];  Service: Urology;  Laterality: Left;  . URETEROSCOPY WITH HOLMIUM LASER LITHOTRIPSY Left 12/05/2015   Procedure: URETEROSCOPY WITH HOLMIUM LASER LITHOTRIPSY;  Surgeon: Hildred Laser, MD;  Location: [facility];  Service:  Urology;  Laterality: Left;  . URETEROSCOPY WITH HOLMIUM LASER LITHOTRIPSY Left 01/16/2016   Procedure: URETEROSCOPY WITH HOLMIUM LASER LITHOTRIPSY;  Surgeon: Hildred Laser, MD;  Location: [facility];  Service: Urology;  Laterality: Left;    Social History  Substance Use Topics  . Smoking status: Never Smoker  . Smokeless tobacco: Never Used  . Alcohol use No     Medication list has been reviewed and updated.   Physical Exam  Constitutional: He is oriented to person, place, and time. He appears well-developed. He appears distressed.  HENT:  Head: Normocephalic and atraumatic.  Cardiovascular: Normal rate, regular rhythm and normal heart sounds.   Pulmonary/Chest: Effort normal and breath sounds normal. No respiratory distress.  Musculoskeletal: He exhibits no edema.       Cervical back: He exhibits decreased range of motion.  Muscle spasm of upper back and neck, greater on left.  Neurological: He is alert and oriented to person, place, and time.  Skin: Skin is warm and dry. No rash noted.  Psychiatric: He has a normal mood and affect. His behavior is normal. Thought content normal.  Nursing note and vitals reviewed.   BP 115/78   Pulse 72   Temp 98.2 F (36.8 C) (Oral)   Resp 16   Ht 6' (1.829 m)   Wt 212 lb (96.2 kg)   SpO2 98%   BMI 28.75 kg/m  Assessment and Plan: 1. Type 2 diabetes mellitus with stage 3 chronic kidney disease, without long-term current use of insulin (HCC) Continue current therapy - Hemoglobin A1c  2. DM type 2 with diabetic peripheral neuropathy (HCC) Improved - will add another gabapentin at hs - gabapentin (NEURONTIN) 300 MG capsule; Take 1 capsule (300 mg total) by mouth 2 (two) times daily. And take 2 at bedtime  Dispense: 360 capsule; Refill: 1  3. Essential hypertension controlled - Comprehensive metabolic panel  4. Hyperlipidemia associated with type 2 diabetes mellitus (HCC) Continue statin therapy - Lipid panel  5. Plantar  fasciitis Stable; continue Mobic if beneficial   Bari EdwardLaura Jairus Tonne, MD [facility]  09/23/2016

## 2016-09-24 ENCOUNTER — Ambulatory Visit: Payer: 59 | Admitting: Urology

## 2016-09-24 LAB — HEMOGLOBIN A1C
ESTIMATED AVERAGE GLUCOSE: 148 mg/dL
Hgb A1c MFr Bld: 6.8 % — ABNORMAL HIGH (ref 4.8–5.6)

## 2016-09-24 LAB — COMPREHENSIVE METABOLIC PANEL
ALBUMIN: 4.2 g/dL (ref 3.5–5.5)
ALT: 36 IU/L (ref 0–44)
AST: 18 IU/L (ref 0–40)
Albumin/Globulin Ratio: 1.8 (ref 1.2–2.2)
Alkaline Phosphatase: 58 IU/L (ref 39–117)
BUN/Creatinine Ratio: 13 (ref 9–20)
BUN: 18 mg/dL (ref 6–24)
Bilirubin Total: 1.8 mg/dL — ABNORMAL HIGH (ref 0.0–1.2)
CALCIUM: 9.1 mg/dL (ref 8.7–10.2)
CO2: 24 mmol/L (ref 18–29)
CREATININE: 1.41 mg/dL — AB (ref 0.76–1.27)
Chloride: 100 mmol/L (ref 96–106)
GFR calc Af Amer: 67 mL/min/{1.73_m2} (ref >59)
GFR, EST NON AFRICAN AMERICAN: 58 mL/min/{1.73_m2} — AB (ref >59)
GLOBULIN, TOTAL: 2.4 g/dL (ref 1.5–4.5)
Glucose: 141 mg/dL — ABNORMAL HIGH (ref 65–99)
Potassium: 4.8 mmol/L (ref 3.5–5.2)
SODIUM: 139 mmol/L (ref 134–144)
Total Protein: 6.6 g/dL (ref 6.0–8.5)

## 2016-09-24 LAB — LIPID PANEL
CHOL/HDL RATIO: 3.1 ratio (ref 0.0–5.0)
Cholesterol, Total: 153 mg/dL (ref 100–199)
HDL: 49 mg/dL (ref >39)
LDL CALC: 79 mg/dL (ref 0–99)
TRIGLYCERIDES: 127 mg/dL (ref 0–149)
VLDL CHOLESTEROL CAL: 25 mg/dL (ref 5–40)

## 2016-09-26 ENCOUNTER — Ambulatory Visit (INDEPENDENT_AMBULATORY_CARE_PROVIDER_SITE_OTHER): Payer: 59 | Admitting: Urology

## 2016-09-26 ENCOUNTER — Ambulatory Visit
Admission: RE | Admit: 2016-09-26 | Discharge: 2016-09-26 | Disposition: A | Discharge status: Home / Self Care | Payer: 59 | Source: Ambulatory Visit | Attending: Urology | Admitting: Urology

## 2016-09-26 ENCOUNTER — Encounter: Payer: Self-pay | Admitting: Urology

## 2016-09-26 VITALS — BP 161/79 | HR 92 | Ht 72.0 in | Wt 224.0 lb

## 2016-09-26 DIAGNOSIS — N200 Calculus of kidney: Secondary | ICD-10-CM

## 2016-09-26 DIAGNOSIS — R1032 Left lower quadrant pain: Secondary | ICD-10-CM | POA: Diagnosis not present

## 2016-09-26 DIAGNOSIS — R109 Unspecified abdominal pain: Secondary | ICD-10-CM | POA: Diagnosis not present

## 2016-09-26 LAB — MICROSCOPIC EXAMINATION: BACTERIA UA: NONE SEEN

## 2016-09-26 LAB — URINALYSIS, COMPLETE
Bilirubin, UA: NEGATIVE
Ketones, UA: NEGATIVE
Leukocytes, UA: NEGATIVE
NITRITE UA: NEGATIVE
PH UA: 5 (ref 5.0–7.5)
Protein, UA: NEGATIVE
RBC, UA: NEGATIVE
Specific Gravity, UA: 1.025 (ref 1.005–1.030)
UUROB: 0.2 mg/dL (ref 0.2–1.0)

## 2016-09-26 NOTE — Progress Notes (Signed)
09/26/2016 10:25 AM   Enis Slipper 29-Mar-1966 [id number]  Referring provider: Schuyler Amor, MD [address] [place], [place] [address]  Chief Complaint  Patient presents with  . Nephrolithiasis    possible stone    HPI: 50 year old male who underwent ESWL 2 for 2 cm left distal ureteral stone complicated by steinstrasse ultimately underwent left ureteroscopy, laser lithotripsy with Dr. Sherryl Barters to clear the residual stone burden. He had a stent removed on 01/24/2016 and was supposed to follow up in 1 month with renal ultrasound prior. He failed to follow up for this.  He returns today to the office as follow-up after recent ER visit on 09/14/2016 with left groin pain. UA at that time showed no evidence of white blood cells or red blood cells.  Since that time, he is continued to have intermittent left lower abdominal pain radiating to his groin but not his testicle. It comes and goes. It is worse after walking around and starts once he sits down.  He denies any urinary issues other than urinary urgency. No dysuria or gross hematuria. No fevers or chills.  His history of stones, is concerned that this may be another kidney stone.  KUB today shows no obvious nephrolithiasis.   PMH: Past Medical History:  Diagnosis Date  . Chest pain    a. 03/2016 Myoview: medium defect of mild severity in basal anteroseptal, mid anteroseptal, and apical septal locations - partially reversible;  b. Cath: nl cors, EF 55-65%. EDP 16 mmHg.  Marland Kitchen Chronic back pain   . CKD (chronic kidney disease), stage III    [name] 04/21/2016  . FH: stroke 09/05/2015  . High cholesterol   . Hx of hypotestosteronemia 09/05/2015  . Hyperglycemia 09/05/2015  . Hypertension 09/05/2015  . Nephrolithiasis "several times"  . Plantar fasciitis, bilateral   . Uncontrolled type II diabetes mellitus [facility])     Surgical History: Past Surgical History:  Procedure Laterality Date  . ANTERIOR CERVICAL  DECOMP/DISCECTOMY FUSION  2006  . CARDIAC CATHETERIZATION N/A 04/23/2016   Procedure: Left Heart Cath and Coronary Angiography;  Surgeon: Lyn Records, MD;  Location: [facility] INVASIVE CV LAB;  Service: Cardiovascular;  Laterality: N/A;  . CYSTOSCOPY W/ RETROGRADES Left 01/16/2016   Procedure: CYSTOSCOPY WITH RETROGRADE PYELOGRAM;  Surgeon: Hildred Laser, MD;  Location: [facility] ORS;  Service: Urology;  Laterality: Left;  . CYSTOSCOPY WITH STENT PLACEMENT Left 12/05/2015   Procedure: CYSTOSCOPY WITH STENT PLACEMENT;  Surgeon: Hildred Laser, MD;  Location: [facility] ORS;  Service: Urology;  Laterality: Left;  . CYSTOSCOPY WITH STENT PLACEMENT Left 01/16/2016   Procedure: CYSTOSCOPY WITH STENT PLACEMENT/ EXCHANGE;  Surgeon: Hildred Laser, MD;  Location: [facility] ORS;  Service: Urology;  Laterality: Left;  . EXTRACORPOREAL SHOCK WAVE LITHOTRIPSY Left 09/20/2015   Procedure: EXTRACORPOREAL SHOCK WAVE LITHOTRIPSY (ESWL);  Surgeon: Hildred Laser, MD;  Location: [facility] ORS;  Service: Urology;  Laterality: Left;  . EXTRACORPOREAL SHOCK WAVE LITHOTRIPSY Left 11/15/2015   Procedure: EXTRACORPOREAL SHOCK WAVE LITHOTRIPSY (ESWL);  Surgeon: Vanna Scotland, MD;  Location: [facility] ORS;  Service: Urology;  Laterality: Left;  . URETEROSCOPY WITH HOLMIUM LASER LITHOTRIPSY Left 12/05/2015   Procedure: URETEROSCOPY WITH HOLMIUM LASER LITHOTRIPSY;  Surgeon: Hildred Laser, MD;  Location: [facility] ORS;  Service: Urology;  Laterality: Left;  . URETEROSCOPY WITH HOLMIUM LASER LITHOTRIPSY Left 01/16/2016   Procedure: URETEROSCOPY WITH HOLMIUM LASER LITHOTRIPSY;  Surgeon: Hildred Laser, MD;  Location: [facility] ORS;  Service: Urology;  Laterality: Left;  Home Medications:    Medication List       Accurate as of 09/26/16 10:25 AM. Always use your most recent med list.          aspirin 325 MG EC tablet Take 1 tablet (325 mg total) by mouth daily.   atorvastatin 10 MG tablet Commonly known as:  LIPITOR Take 1 tablet (10  mg total) by mouth daily at 6 PM.   gabapentin 300 MG capsule Commonly known as:  NEURONTIN Take 1 capsule (300 mg total) by mouth 2 (two) times daily. And take 2 at bedtime   HYDROcodone-acetaminophen 5-325 MG tablet Commonly known as:  NORCO/VICODIN Take 1-2 tablets by mouth every 6 (six) hours as needed.   lisinopril 10 MG tablet Commonly known as:  PRINIVIL,ZESTRIL Take 1 tablet (10 mg total) by mouth daily.   meloxicam 15 MG tablet Commonly known as:  MOBIC Take 1 tablet (15 mg total) by mouth daily.   metFORMIN 500 MG tablet Commonly known as:  GLUCOPHAGE Take 1 tablet (500 mg total) by mouth 2 (two) times daily with a meal.       Allergies: No Known Allergies  Family History: Family History  Problem Relation Age of Onset  . Stroke Father   . Nephrolithiasis Father   . Prostate cancer Neg Hx   . Bladder Cancer Neg Hx     Social History:  reports that he has never smoked. He has never used smokeless tobacco. He reports that he does not drink alcohol or use drugs.  ROS: UROLOGY Frequent Urination?: No Hard to postpone urination?: No Burning/pain with urination?: No Get up at night to urinate?: Yes Leakage of urine?: No Urine stream starts and stops?: No Trouble starting stream?: No Do you have to strain to urinate?: No Blood in urine?: No Urinary tract infection?: No Sexually transmitted disease?: No Injury to kidneys or bladder?: No Painful intercourse?: No Weak stream?: No Erection problems?: No Penile pain?: No  Gastrointestinal Nausea?: No Vomiting?: No Indigestion/heartburn?: No Diarrhea?: No Constipation?: No  Constitutional Fever: No Night sweats?: No Weight loss?: No Fatigue?: No  Skin Skin rash/lesions?: No Itching?: No  Eyes Blurred vision?: No Double vision?: No  Ears/Nose/Throat Sore throat?: No Sinus problems?: No  Hematologic/Lymphatic Swollen glands?: No Easy bruising?: No  Cardiovascular Leg swelling?:  No Chest pain?: No  Respiratory Cough?: No Shortness of breath?: No  Endocrine Excessive thirst?: No  Musculoskeletal Back pain?: Yes Joint pain?: Yes  Neurological Headaches?: No Dizziness?: No  Psychologic Depression?: No Anxiety?: No  Physical Exam: BP (!) 161/79   Pulse 92   Ht 6' (1.829 m)   Wt 224 lb (101.6 kg)   BMI 30.38 kg/m   Constitutional:  Alert and oriented, No acute distress. HEENT: [place] AT, moist mucus membranes.  Trachea midline, no masses. Cardiovascular: No clubbing, cyanosis, or edema. Respiratory: Normal respiratory effort, no increased work of breathing. GI: Abdomen is soft, nondistended, no abdominal masses.  Mildly tender in the left lower quadrant with deep palpation.  No obvious inguinal hernias bilaterally when examined in the supine position with Valsalva. GU: Circumcised phallus with orthotopic meatus. Bilateral testicles descended without masses. Nontender.  No CVA tenderness bilaterally. Skin: No rashes, bruises or suspicious lesions. Lymph: No inguinal adenopathy. Neurologic: Grossly intact, no focal deficits, moving all 4 extremities. Psychiatric: Normal mood and affect.  Laboratory Data: Lab Results  Component Value Date   WBC 8.0 04/30/2016   HGB 13.5 04/23/2016   HCT 43.4 04/30/2016  MCV 87 04/30/2016   PLT 225 04/30/2016    Lab Results  Component Value Date   CREATININE 1.41 (H) 09/23/2016     Lab Results  Component Value Date   TESTOSTERONE 244 (L) 09/05/2015    Lab Results  Component Value Date   HGBA1C 6.8 (H) 09/23/2016    Urinalysis UA today reviewed, negative other than 3+ glucose. No evidence of microscopic blood or white cells.  Pertinent Imaging: Study Result   CLINICAL DATA:  Left-sided groin pain.  History of nephrolithiasis.  EXAM: ABDOMEN - 1 VIEW  COMPARISON:  12/20/2015 .  FINDINGS: Previously identified left double-J ureteral stent has been removed. No evidence of nephrolithiasis.  Stable pelvic calcifications consistent with phleboliths. No bowel distention. Stool noted throughout the colon. No acute bony abnormality .  IMPRESSION: 1. Interim removal of double-J left ureteral stent.  2. Stable pelvic calcifications consistent phleboliths. No evidence of nephrolithiasis or urolithiasis.   Electronically Signed   By: Maisie Fus  Register   On: 09/26/2016 09:54   KUB reviewed today and compared to previous KUBs and CT scans.  Assessment & Plan:    1. Nephrolithiasis History of nephrolithiasis and obstructing left ureteral stones status post multiple procedures without follow-up renal ultrasound. - Urinalysis, Complete - Abdomen 1 view (KUB); Future - US Renal; Future  2. Left lower quadrant pain Etiology of pain unclear, however, given history of stones this is certainly a possibility. UA today negative for any evidence of blood. KUB shows no obvious stones.  Cr 3 days ago at baseline. I offered him a noncontrast CT scan versus follow-up renal ultrasound. He is mostly just a renal ultrasound. If there is no evidence of hydronephrosis, stone would be highly unlikely as the etiology of his discomfort. I will call him with these results. If negative, he'll follow-up with his PCP for left lower quadrant pain workup.   Return if symptoms worsen or fail to improve, for will call with renal ultrasound results.  Vanna Scotland, MD  [facility] [address] [place], [place] [address] [phone]

## 2016-10-02 ENCOUNTER — Ambulatory Visit: Payer: 59

## 2016-10-03 ENCOUNTER — Telehealth: Payer: Self-pay

## 2016-10-03 ENCOUNTER — Ambulatory Visit
Admission: RE | Admit: 2016-10-03 | Discharge: 2016-10-03 | Disposition: A | Discharge status: Home / Self Care | Payer: 59 | Source: Ambulatory Visit | Attending: Urology | Admitting: Urology

## 2016-10-03 DIAGNOSIS — N200 Calculus of kidney: Secondary | ICD-10-CM

## 2016-10-03 DIAGNOSIS — N183 Chronic kidney disease, stage 3 (moderate): Secondary | ICD-10-CM | POA: Diagnosis not present

## 2016-10-03 NOTE — Telephone Encounter (Signed)
The pt came by the office right after having his Renal US done requesting pain medication. Dr.Budzyn reviewed his US and he was informed that the US was negative for hydronephrosis. Pt notified that he needs to f/u with his PCP per your last note if his US came back negative

## 2016-10-06 ENCOUNTER — Encounter: Payer: Self-pay | Admitting: Internal Medicine

## 2016-10-06 ENCOUNTER — Ambulatory Visit (INDEPENDENT_AMBULATORY_CARE_PROVIDER_SITE_OTHER): Payer: 59 | Admitting: Internal Medicine

## 2016-10-06 ENCOUNTER — Telehealth: Payer: Self-pay

## 2016-10-06 VITALS — BP 143/94 | HR 87 | Resp 16 | Ht 72.0 in | Wt 224.0 lb

## 2016-10-06 DIAGNOSIS — N410 Acute prostatitis: Secondary | ICD-10-CM

## 2016-10-06 DIAGNOSIS — N200 Calculus of kidney: Secondary | ICD-10-CM

## 2016-10-06 MED ORDER — CIPROFLOXACIN HCL 500 MG PO TABS: 500.0000 mg | ORAL_TABLET | Freq: Two times a day (BID) | ORAL | 0 refills | Status: DC

## 2016-10-06 NOTE — Telephone Encounter (Signed)
LMOM

## 2016-10-06 NOTE — Telephone Encounter (Signed)
Spoke with pt in reference to RUS results. Pt stated that he saw his PCP today and she dx him with prostatitis and gave abx. Reinforced with pt the need of KUB in 1 year and call if s/s do not improve. Pt voiced understanding.

## 2016-10-06 NOTE — Telephone Encounter (Signed)
-----   Message from Vanna ScotlandAshley Brandon, MD sent at 10/03/2016  4:37 PM EDT ----- Renal ultrasound looks pretty good. There is no evidence of an obstructing stone as the cause of his pain. He does have possibly 2 small nonobstructing left-sided stones in his kidney which should not be causing pain. Is probably a good idea to follow up in 1 year with a KUB. Please arrange this.  Vanna ScotlandAshley Brandon, MD

## 2016-10-06 NOTE — Patient Instructions (Signed)
Aleve 2 tabs twice a day  Prostatitis The prostate gland is about the size and shape of a walnut. It is located just below your bladder. It produces one of the components of semen, which is made up of sperm and the fluids that help nourish and transport it out from the testicles. Prostatitis is inflammation of the prostate gland.  There are four types of prostatitis:  Acute bacterial prostatitis. This is the least common type of prostatitis. It starts quickly and usually is associated with a bladder infection, high fever, and shaking chills. It can occur at any age.  Chronic bacterial prostatitis. This is a persistent bacterial infection in the prostate. It usually develops from repeated acute bacterial prostatitis or acute bacterial prostatitis that was not properly treated. It can occur in men of any age but is most common in middle-aged men whose prostate has begun to enlarge. The symptoms are not as severe as those in acute bacterial prostatitis. Discomfort in the part of your body that is in front of your rectum and below your scrotum (perineum), lower abdomen, or in the head of your penis (glans) may represent your primary discomfort.  Chronic prostatitis (nonbacterial). This is the most common type of prostatitis. It is inflammation of the prostate gland that is not caused by a bacterial infection. The cause is unknown and may be associated with a viral infection or autoimmune disorder.  Prostatodynia (pelvic floor disorder). This is associated with increased muscular tone in the pelvis surrounding the prostate. CAUSES The causes of bacterial prostatitis are bacterial infection. The causes of the other types of prostatitis are unknown.  SYMPTOMS  Symptoms can vary depending upon the type of prostatitis that exists. There can also be overlap in symptoms. Possible symptoms for each type of prostatitis are listed below. Acute Bacterial Prostatitis  Painful urination.  Fever or  chills.  Muscle or joint pains.  Low back pain.  Low abdominal pain.  Inability to empty bladder completely. Chronic Bacterial Prostatitis, Chronic Nonbacterial Prostatitis, and Prostatodynia  Sudden urge to urinate.  Frequent urination.  Difficulty starting urine stream.  Weak urine stream.  Discharge from the urethra.  Dribbling after urination.  Rectal pain.  Pain in the testicles, penis, or tip of the penis.  Pain in the perineum.  Problems with sexual function.  Painful ejaculation.  Bloody semen. DIAGNOSIS  In order to diagnose prostatitis, your health care provider will ask about your symptoms. One or more urine samples will be taken and tested (urinalysis). If the urinalysis result is negative for bacteria, your health care provider may use a finger to feel your prostate (digital rectal exam). This exam helps your health care provider determine if your prostate is swollen and tender. It will also produce a specimen of semen that can be analyzed. TREATMENT  Treatment for prostatitis depends on the cause. If a bacterial infection is the cause, it can be treated with antibiotic medicine. In cases of chronic bacterial prostatitis, the use of antibiotics for up to 1 month or 6 weeks may be necessary. Your health care provider may instruct you to take sitz baths to help relieve pain. A sitz bath is a bath of hot water in which your hips and buttocks are under water. This relaxes the pelvic floor muscles and often helps to relieve the pressure on your prostate. HOME CARE INSTRUCTIONS   Take all medicines as directed by your health care provider.  Take sitz baths as directed by your health care provider. SEEK  MEDICAL CARE IF:   Your symptoms get worse, not better.  You have a fever. SEEK IMMEDIATE MEDICAL CARE IF:   You have chills.  You feel nauseous or vomit.  You feel lightheaded or faint.  You are unable to urinate.  You have blood or blood clots in your  urine. MAKE SURE YOU:  Understand these instructions.  Will watch your condition.  Will get help right away if you are not doing well or get worse.   This information is not intended to replace advice given to you by your health care provider. Make sure you discuss any questions you have with your health care provider.   Document Released: 11/14/2000 Document Revised: 12/08/2014 Document Reviewed: 06/06/2013 Elsevier Interactive Patient Education [employer]2016 Elsevier Inc.

## 2016-10-06 NOTE — Progress Notes (Signed)
Date:  10/06/2016   Name:  Brian Butler   DOB:  Jan 17, 1966   MRN:  [id number]   Chief Complaint: Groin Pain (Radiology tests all normal. Pain in bladder is what it feels like and has to urinate frequently 4-6 times at night. ) He's had pain in his suprapubic region off and on for the past month. Has been getting slightly worse. He had a complete evaluation by urology with regards to kidney stones which he does not appear to have currently active. He describes a sharp throbbing shooting intermittent pain in his suprapubic region. Seems to be temporarily relieved by a bowel movement. He also has frequent urination 4-5 times at night. He denies fever chills or other symptoms. He has not had a prostate exam.   Review of Systems  Constitutional: Negative for chills and fever.  Gastrointestinal: Positive for abdominal pain. Negative for blood in stool.  Genitourinary: Positive for frequency. Negative for difficulty urinating, hematuria, penile pain, penile swelling, scrotal swelling and testicular pain.    Patient Active Problem List   Diagnosis Date Noted  . Type 2 diabetes mellitus with stage 3 chronic kidney disease, without long-term current use of insulin (HCC) 09/18/2016  . Biceps tendonitis on left 08/07/2016  . Pigmented skin lesion of uncertain nature 08/07/2016  . Neck muscle spasm 08/07/2016  . DM type 2 with diabetic peripheral neuropathy (HCC) 08/07/2016  . Plantar fasciitis 05/30/2016  . Chronic heel pain 05/30/2016  . Hyperlipidemia associated with type 2 diabetes mellitus (HCC) 04/30/2016  . Chronic low back pain 04/30/2016  . Overweight (BMI 25.0-29.9) 04/30/2016  . Abnormal nuclear cardiac imaging test 04/23/2016  . Unstable angina (HCC)   . Essential hypertension 09/05/2015  . Nephrolithiasis 09/05/2015  . Hx of hypotestosteronemia 09/05/2015    Prior to Admission medications   Medication Sig Start Date End Date Taking? Authorizing Provider  aspirin 325 MG EC  tablet Take 1 tablet (325 mg total) by mouth daily. 04/23/16  Yes Dorothea OgleIskra M Myers, MD  atorvastatin (LIPITOR) 10 MG tablet Take 1 tablet (10 mg total) by mouth daily at 6 PM. 08/07/16  Yes Reubin MilanLaura H Jerick Khachatryan, MD  gabapentin (NEURONTIN) 300 MG capsule Take 1 capsule (300 mg total) by mouth 2 (two) times daily. And take 2 at bedtime 09/23/16  Yes Reubin MilanLaura H Grady Mohabir, MD  HYDROcodone-acetaminophen (NORCO/VICODIN) 5-325 MG tablet Take 1-2 tablets by mouth every 6 (six) hours as needed. 09/14/16  Yes Payton Mccallumrlando Conty, MD  lisinopril (PRINIVIL,ZESTRIL) 10 MG tablet Take 1 tablet (10 mg total) by mouth daily. 06/16/16  Yes Duanne Limerickeanna C Jones, MD  meloxicam (MOBIC) 15 MG tablet Take 1 tablet (15 mg total) by mouth daily. 08/07/16  Yes Reubin MilanLaura H Tyriana Helmkamp, MD  metFORMIN (GLUCOPHAGE) 500 MG tablet Take 1 tablet (500 mg total) by mouth 2 (two) times daily with a meal. 06/16/16  Yes Duanne Limerickeanna C Jones, MD    No Known Allergies  Past Surgical History:  Procedure Laterality Date  . ANTERIOR CERVICAL DECOMP/DISCECTOMY FUSION  2006  . CARDIAC CATHETERIZATION N/A 04/23/2016   Procedure: Left Heart Cath and Coronary Angiography;  Surgeon: Lyn RecordsHenry W Smith, MD;  [facility];  Service: Cardiovascular;  Laterality: N/A;  . CYSTOSCOPY W/ RETROGRADES Left 01/16/2016   Procedure: CYSTOSCOPY WITH RETROGRADE PYELOGRAM;  Surgeon: Hildred LaserBrian James Budzyn, MD;  Location: [facility] ORS;  Service: Urology;  Laterality: Left;  . CYSTOSCOPY WITH STENT PLACEMENT Left 12/05/2015   Procedure: CYSTOSCOPY WITH STENT PLACEMENT;  Surgeon: Cloyde ReamsBrian James  Sherryl BartersBudzyn, MD;  Location: [facility] ORS;  Service: Urology;  Laterality: Left;  . CYSTOSCOPY WITH STENT PLACEMENT Left 01/16/2016   Procedure: CYSTOSCOPY WITH STENT PLACEMENT/ EXCHANGE;  Surgeon: Hildred LaserBrian James Budzyn, MD;  Location: [facility] ORS;  Service: Urology;  Laterality: Left;  . EXTRACORPOREAL SHOCK WAVE LITHOTRIPSY Left 09/20/2015   Procedure: EXTRACORPOREAL SHOCK WAVE LITHOTRIPSY (ESWL);  Surgeon: Hildred LaserBrian James Budzyn,  MD;  Location: [facility] ORS;  Service: Urology;  Laterality: Left;  . EXTRACORPOREAL SHOCK WAVE LITHOTRIPSY Left 11/15/2015   Procedure: EXTRACORPOREAL SHOCK WAVE LITHOTRIPSY (ESWL);  Surgeon: Vanna ScotlandAshley Brandon, MD;  Location: [facility] ORS;  Service: Urology;  Laterality: Left;  . URETEROSCOPY WITH HOLMIUM LASER LITHOTRIPSY Left 12/05/2015   Procedure: URETEROSCOPY WITH HOLMIUM LASER LITHOTRIPSY;  Surgeon: Hildred LaserBrian James Budzyn, MD;  Location: [facility] OR[facility];  Service: Urology;  Laterality: Left;  . URETEROSCOPY WITH HOLMIUM LASER LITHOTRIPSY Left 01/16/2016   Procedure: URETEROSCOPY WITH HOLMIUM LASER LITHOTRIPSY;  Surgeon: Hildred LaserBrian James Budzyn, MD;  Location: [facility] ORS;  Service: Urology;  Laterality: Left;    Social History  Substance Use Topics  . Smoking status: Never Smoker  . Smokeless tobacco: Never Used  . Alcohol use No     Medication list has been reviewed and updated.   Physical Exam  Constitutional: He is oriented to person, place, and time. He appears well-developed. No distress.  HENT:  Head: Normocephalic and atraumatic.  Pulmonary/Chest: Effort normal. No respiratory distress.  Abdominal: There is tenderness in the suprapubic area.  Genitourinary: Rectum normal. Prostate is tender. Prostate is not enlarged.  Musculoskeletal: Normal range of motion.  Neurological: He is alert and oriented to person, place, and time.  Skin: Skin is warm and dry. No rash noted.  Psychiatric: He has a normal mood and affect. His behavior is normal. Thought content normal.  Nursing note and vitals reviewed.   BP (!) 143/94   Pulse 87   Resp 16   Ht 6' (1.829 m)   Wt 224 lb (101.6 kg)   SpO2 97%   BMI 30.38 kg/m   Assessment and Plan: 1. Acute prostatitis Take Aleve bid for discomfort - ciprofloxacin (CIPRO) 500 MG tablet; Take 1 tablet (500 mg total) by mouth 2 (two) times daily.  Dispense: 28 tablet; Refill: 0   Bari EdwardLaura Hendel Gatliff, MD [facility]  10/06/2016

## 2016-10-19 ENCOUNTER — Emergency Department: Payer: 59

## 2016-10-19 ENCOUNTER — Emergency Department
Admission: EM | Admit: 2016-10-19 | Discharge: 2016-10-19 | Disposition: A | Discharge status: Home / Self Care | Payer: 59 | Attending: Emergency Medicine | Admitting: Emergency Medicine

## 2016-10-19 DIAGNOSIS — I129 Hypertensive chronic kidney disease with stage 1 through stage 4 chronic kidney disease, or unspecified chronic kidney disease: Secondary | ICD-10-CM | POA: Insufficient documentation

## 2016-10-19 DIAGNOSIS — R102 Pelvic and perineal pain: Secondary | ICD-10-CM | POA: Diagnosis present

## 2016-10-19 DIAGNOSIS — R109 Unspecified abdominal pain: Secondary | ICD-10-CM | POA: Diagnosis not present

## 2016-10-19 DIAGNOSIS — E1122 Type 2 diabetes mellitus with diabetic chronic kidney disease: Secondary | ICD-10-CM | POA: Insufficient documentation

## 2016-10-19 DIAGNOSIS — Z7984 Long term (current) use of oral hypoglycemic drugs: Secondary | ICD-10-CM | POA: Insufficient documentation

## 2016-10-19 DIAGNOSIS — N183 Chronic kidney disease, stage 3 (moderate): Secondary | ICD-10-CM | POA: Diagnosis not present

## 2016-10-19 DIAGNOSIS — Z7982 Long term (current) use of aspirin: Secondary | ICD-10-CM | POA: Diagnosis not present

## 2016-10-19 DIAGNOSIS — N201 Calculus of ureter: Secondary | ICD-10-CM | POA: Insufficient documentation

## 2016-10-19 DIAGNOSIS — N132 Hydronephrosis with renal and ureteral calculous obstruction: Secondary | ICD-10-CM | POA: Diagnosis not present

## 2016-10-19 LAB — BASIC METABOLIC PANEL
Anion gap: 5 (ref 5–15)
BUN: 24 mg/dL — AB (ref 6–20)
CHLORIDE: 102 mmol/L (ref 101–111)
CO2: 29 mmol/L (ref 22–32)
CREATININE: 1.52 mg/dL — AB (ref 0.61–1.24)
Calcium: 9.1 mg/dL (ref 8.9–10.3)
GFR calc Af Amer: >60 mL/min (ref >60)
GFR calc non Af Amer: 52 mL/min — ABNORMAL LOW (ref >60)
GLUCOSE: 167 mg/dL — AB (ref 65–99)
POTASSIUM: 4 mmol/L (ref 3.5–5.1)
Sodium: 136 mmol/L (ref 135–145)

## 2016-10-19 LAB — CBC WITH DIFFERENTIAL/PLATELET
Basophils Absolute: 0 10*3/uL (ref 0–0.1)
Basophils Relative: 0 %
EOS ABS: 0.2 10*3/uL (ref 0–0.7)
Eosinophils Relative: 3 %
HEMATOCRIT: 40.6 % (ref 40.0–52.0)
HEMOGLOBIN: 14.4 g/dL (ref 13.0–18.0)
LYMPHS ABS: 2.1 10*3/uL (ref 1.0–3.6)
LYMPHS PCT: 27 %
MCH: 31 pg (ref 26.0–34.0)
MCHC: 35.4 g/dL (ref 32.0–36.0)
MCV: 87.5 fL (ref 80.0–100.0)
MONOS PCT: 7 %
Monocytes Absolute: 0.6 10*3/uL (ref 0.2–1.0)
NEUTROS ABS: 4.9 10*3/uL (ref 1.4–6.5)
NEUTROS PCT: 63 %
Platelets: 188 10*3/uL (ref 150–440)
RBC: 4.65 MIL/uL (ref 4.40–5.90)
RDW: 13.4 % (ref 11.5–14.5)
WBC: 7.8 10*3/uL (ref 3.8–10.6)

## 2016-10-19 LAB — URINALYSIS COMPLETE WITH MICROSCOPIC (ARMC ONLY)
BACTERIA UA: NONE SEEN
SPECIFIC GRAVITY, URINE: 1.024 (ref 1.005–1.030)

## 2016-10-19 MED ORDER — MORPHINE SULFATE (PF) 4 MG/ML IV SOLN
INTRAVENOUS | Status: AC
  Administered 2016-10-19: 4 mg via INTRAVENOUS
  Filled 2016-10-19: qty 1

## 2016-10-19 MED ORDER — HYDROMORPHONE HCL 1 MG/ML IJ SOLN
1.0000 mg | Freq: Once | INTRAMUSCULAR | Status: AC
  Administered 2016-10-19: 1 mg via INTRAVENOUS
  Filled 2016-10-19: qty 1

## 2016-10-19 MED ORDER — OXYCODONE-ACETAMINOPHEN 5-325 MG PO TABS: 1.0000 | ORAL_TABLET | ORAL | 0 refills | Status: DC | PRN

## 2016-10-19 MED ORDER — ONDANSETRON HCL 4 MG/2ML IJ SOLN
4.0000 mg | Freq: Once | INTRAMUSCULAR | Status: AC
Start: 2016-10-19 — End: 2016-10-19
  Administered 2016-10-19: 4 mg via INTRAVENOUS

## 2016-10-19 MED ORDER — OXYCODONE-ACETAMINOPHEN 5-325 MG PO TABS
1.0000 | ORAL_TABLET | Freq: Once | ORAL | Status: AC
  Administered 2016-10-19: 1 via ORAL
  Filled 2016-10-19: qty 1

## 2016-10-19 MED ORDER — ONDANSETRON HCL 4 MG PO TABS: 4.0000 mg | ORAL_TABLET | Freq: Three times a day (TID) | ORAL | 0 refills | Status: DC | PRN

## 2016-10-19 MED ORDER — MORPHINE SULFATE (PF) 4 MG/ML IV SOLN
4.0000 mg | Freq: Once | INTRAVENOUS | Status: AC
Start: 2016-10-19 — End: 2016-10-19
  Administered 2016-10-19: 4 mg via INTRAVENOUS

## 2016-10-19 MED ORDER — ONDANSETRON HCL 4 MG/2ML IJ SOLN
INTRAMUSCULAR | Status: AC
  Administered 2016-10-19: 4 mg via INTRAVENOUS
  Filled 2016-10-19: qty 2

## 2016-10-19 MED ORDER — SODIUM CHLORIDE 0.9 % IV BOLUS (SEPSIS)
1000.0000 mL | Freq: Once | INTRAVENOUS | Status: AC
  Administered 2016-10-19: 1000 mL via INTRAVENOUS

## 2016-10-19 NOTE — ED Provider Notes (Signed)
[facility] Emergency Department Provider Note  __________________________________   I have reviewed the triage vital signs and the nursing notes.   HISTORY  Chief Complaint Flank Pain   History limited by: Not Limited   HPI Brian Butler is a 50 y.o. male with history of kidney stones presents to the emergency department today because of concerns for left-sided flank pain and kidney stone. The patient states that he's been having pain in his lower pelvis for roughly the past 2 weeks. Has been seen his primary care doctor who is been treating him for prostatitis. Yesterday however he started having pain moving up his left flank. Today the pain has become more severe. States that it goes all the way up to his left flank. This does feel like his previous kidney stones. He has required lithotripsy in the past. He has not noticed any bad odor or blood in his urine. He denies any fevers.   Past Medical History:  Diagnosis Date  . Chest pain    a. 03/2016 Myoview: medium defect of mild severity in basal anteroseptal, mid anteroseptal, and apical septal locations - partially reversible;  b. Cath: nl cors, EF 55-65%. EDP 16 mmHg.  Marland Kitchen Chronic back pain   . CKD (chronic kidney disease), stage III    [name] 04/21/2016  . FH: stroke 09/05/2015  . High cholesterol   . Hx of hypotestosteronemia 09/05/2015  . Hyperglycemia 09/05/2015  . Hypertension 09/05/2015  . Nephrolithiasis "several times"  . Plantar fasciitis, bilateral   . Uncontrolled type II diabetes mellitus [facility])     Patient Active Problem List   Diagnosis Date Noted  . Type 2 diabetes mellitus with stage 3 chronic kidney disease, without long-term current use of insulin (HCC) 09/18/2016  . Biceps tendonitis on left 08/07/2016  . Pigmented skin lesion of uncertain nature 08/07/2016  . Neck muscle spasm 08/07/2016  . DM type 2 with diabetic peripheral neuropathy (HCC) 08/07/2016  . Plantar fasciitis 05/30/2016   . Chronic heel pain 05/30/2016  . Hyperlipidemia associated with type 2 diabetes mellitus (HCC) 04/30/2016  . Chronic low back pain 04/30/2016  . Overweight (BMI 25.0-29.9) 04/30/2016  . Abnormal nuclear cardiac imaging test 04/23/2016  . Unstable angina (HCC)   . Essential hypertension 09/05/2015  . Nephrolithiasis 09/05/2015  . Hx of hypotestosteronemia 09/05/2015    Past Surgical History:  Procedure Laterality Date  . ANTERIOR CERVICAL DECOMP/DISCECTOMY FUSION  2006  . CARDIAC CATHETERIZATION N/A 04/23/2016   Procedure: Left Heart Cath and Coronary Angiography;  Surgeon: Lyn Records, MD;  Location: [facility] INVASIVE CV LAB;  Service: Cardiovascular;  Laterality: N/A;  . CYSTOSCOPY W/ RETROGRADES Left 01/16/2016   Procedure: CYSTOSCOPY WITH RETROGRADE PYELOGRAM;  Surgeon: Hildred Laser, MD;  Location: [facility] ORS;  Service: Urology;  Laterality: Left;  . CYSTOSCOPY WITH STENT PLACEMENT Left 12/05/2015   Procedure: CYSTOSCOPY WITH STENT PLACEMENT;  Surgeon: Hildred Laser, MD;  Location: [facility] ORS;  Service: Urology;  Laterality: Left;  . CYSTOSCOPY WITH STENT PLACEMENT Left 01/16/2016   Procedure: CYSTOSCOPY WITH STENT PLACEMENT/ EXCHANGE;  Surgeon: Hildred Laser, MD;  Location: [facility] ORS;  Service: Urology;  Laterality: Left;  . EXTRACORPOREAL SHOCK WAVE LITHOTRIPSY Left 09/20/2015   Procedure: EXTRACORPOREAL SHOCK WAVE LITHOTRIPSY (ESWL);  Surgeon: Hildred Laser, MD;  Location: [facility] ORS;  Service: Urology;  Laterality: Left;  . EXTRACORPOREAL SHOCK WAVE LITHOTRIPSY Left 11/15/2015   Procedure: EXTRACORPOREAL SHOCK WAVE LITHOTRIPSY (ESWL);  Surgeon: Vanna Scotland, MD;  Location:  [facility] ORS;  Service: Urology;  Laterality: Left;  . URETEROSCOPY WITH HOLMIUM LASER LITHOTRIPSY Left 12/05/2015   Procedure: URETEROSCOPY WITH HOLMIUM LASER LITHOTRIPSY;  Surgeon: Hildred LaserBrian James Budzyn, MD;  Location: [facility] ORS;  Service: Urology;  Laterality: Left;  . URETEROSCOPY WITH HOLMIUM LASER  LITHOTRIPSY Left 01/16/2016   Procedure: URETEROSCOPY WITH HOLMIUM LASER LITHOTRIPSY;  Surgeon: Hildred LaserBrian James Budzyn, MD;  Location: [facility] ORS;  Service: Urology;  Laterality: Left;    Prior to Admission medications   Medication Sig Start Date End Date Taking? Authorizing Provider  aspirin 325 MG EC tablet Take 1 tablet (325 mg total) by mouth daily. 04/23/16   Dorothea OgleIskra M Myers, MD  atorvastatin (LIPITOR) 10 MG tablet Take 1 tablet (10 mg total) by mouth daily at 6 PM. 08/07/16   Reubin MilanLaura H Berglund, MD  ciprofloxacin (CIPRO) 500 MG tablet Take 1 tablet (500 mg total) by mouth 2 (two) times daily. 10/06/16   Reubin MilanLaura H Berglund, MD  gabapentin (NEURONTIN) 300 MG capsule Take 1 capsule (300 mg total) by mouth 2 (two) times daily. And take 2 at bedtime 09/23/16   Reubin MilanLaura H Berglund, MD  HYDROcodone-acetaminophen (NORCO/VICODIN) 5-325 MG tablet Take 1-2 tablets by mouth every 6 (six) hours as needed. 09/14/16   Payton Mccallumrlando Conty, MD  lisinopril (PRINIVIL,ZESTRIL) 10 MG tablet Take 1 tablet (10 mg total) by mouth daily. 06/16/16   Duanne Limerickeanna C Jones, MD  meloxicam (MOBIC) 15 MG tablet Take 1 tablet (15 mg total) by mouth daily. 08/07/16   Reubin MilanLaura H Berglund, MD  metFORMIN (GLUCOPHAGE) 500 MG tablet Take 1 tablet (500 mg total) by mouth 2 (two) times daily with a meal. 06/16/16   Duanne Limerickeanna C Jones, MD    Allergies Patient has no known allergies.  Family History  Problem Relation Age of Onset  . Stroke Father   . Nephrolithiasis Father   . Prostate cancer Neg Hx   . Bladder Cancer Neg Hx     Social History Social History  Substance Use Topics  . Smoking status: Never Smoker  . Smokeless tobacco: Never Used  . Alcohol use No    Review of Systems  Constitutional: Negative for fever. Cardiovascular: Negative for chest pain. Respiratory: Negative for shortness of breath. Gastrointestinal: Positive for left sided flank pain and nausea.  Genitourinary: Negative for dysuria. Neurological: Negative for headaches, focal  weakness or numbness.  10-point ROS otherwise negative.  ____________________________________________   PHYSICAL EXAM:  VITAL SIGNS: ED Triage Vitals  Enc Vitals Group     BP 10/19/16 1846 (!) 147/85     Pulse Rate 10/19/16 1846 83     Resp 10/19/16 1846 18     Temp 10/19/16 1846 98.3 F (36.8 C)     Temp Source 10/19/16 1846 Oral     SpO2 10/19/16 1846 99 %     Weight 10/19/16 1847 212 lb (96.2 kg)     Height 10/19/16 1847 6' (1.829 m)   Constitutional: Alert and oriented. Appears in no acute distress. Eyes: Conjunctivae are normal. Normal extraocular movements. ENT   Head: Normocephalic and atraumatic.   Nose: No congestion/rhinnorhea.   Mouth/Throat: Mucous membranes are moist.   Neck: No stridor. Hematological/Lymphatic/Immunilogical: No cervical lymphadenopathy. Cardiovascular: Normal rate, regular rhythm.  No murmurs, rubs, or gallops. Respiratory: Normal respiratory effort without tachypnea nor retractions. Breath sounds are clear and equal bilaterally. No wheezes/rales/rhonchi. Gastrointestinal: Soft and nontender. No distention.  Genitourinary: Deferred Musculoskeletal: Normal range of motion in all extremities. No lower extremity edema. Neurologic:  Normal  speech and language. No gross focal neurologic deficits are appreciated.  Skin:  Skin is warm, dry and intact. No rash noted. Psychiatric: Mood and affect are normal. Speech and behavior are normal. Patient exhibits appropriate insight and judgment.  ____________________________________________    LABS (pertinent positives/negatives)  Labs Reviewed  URINALYSIS COMPLETEWITH MICROSCOPIC ([facility] ONLY) - Abnormal; Notable for the following:       Result Value   Color, Urine ORANGE (*)    APPearance CLEAR (*)    Glucose, UA   (*)    Value: TEST NOT REPORTED DUE TO COLOR INTERFERENCE OF URINE PIGMENT   Bilirubin Urine   (*)    Value: TEST NOT REPORTED DUE TO COLOR INTERFERENCE OF URINE PIGMENT    Ketones, ur   (*)    Value: TEST NOT REPORTED DUE TO COLOR INTERFERENCE OF URINE PIGMENT   Hgb urine dipstick   (*)    Value: TEST NOT REPORTED DUE TO COLOR INTERFERENCE OF URINE PIGMENT   Protein, ur   (*)    Value: TEST NOT REPORTED DUE TO COLOR INTERFERENCE OF URINE PIGMENT   Nitrite   (*)    Value: TEST NOT REPORTED DUE TO COLOR INTERFERENCE OF URINE PIGMENT   Leukocytes, UA   (*)    Value: TEST NOT REPORTED DUE TO COLOR INTERFERENCE OF URINE PIGMENT   Squamous Epithelial / LPF 0-5 (*)    All other components within normal limits  BASIC METABOLIC PANEL - Abnormal; Notable for the following:    Glucose, Bld 167 (*)    BUN 24 (*)    Creatinine, Ser 1.52 (*)    GFR calc non Af Amer 52 (*)    All other components within normal limits  CBC WITH DIFFERENTIAL/PLATELET     ____________________________________________   EKG  None  ____________________________________________    RADIOLOGY  CT renal IMPRESSION:  1. Acute obstructive uropathy on the left with a cluster of  obstructing stones in the distal left ureter. The largest and most  distal stone is 10 x 8 mm, located about 18 mm from the left UVJ.  See coronal image 87.  2. No other urologic calculus identified.  3. Chronic fatty liver disease.      ____________________________________________   PROCEDURES  Procedures  ____________________________________________   INITIAL IMPRESSION / ASSESSMENT AND PLAN / ED COURSE  Pertinent labs & imaging results that were available during my care of the patient were reviewed by me and considered in my medical decision making (see chart for details).  Patient here with left flank pain and concern for possible kidney stones. Patient has history of kidney stones. Will check urine, blood work and obtain imaging.  Clinical Course    CT scan shows 2 stones in the left ureter with severe hydronephrosis. I discussed with Dr. Apolinar JunesBrandon with urology. She thought if patient's  pain could be put under control that he can be discharged to follow-up in the clinic tomorrow. She did advise that he be nothing by mouth after midnight because she will likely if possible tried to remove the stones tomorrow. ____________________________________________   FINAL CLINICAL IMPRESSION(S) / ED DIAGNOSES  Final diagnoses:  Left flank pain  Ureterolithiasis     Note: This dictation was prepared with Dragon dictation. Any transcriptional errors that result from this process are unintentional    Phineas SemenGraydon Judah Chevere, MD 10/20/16 1713

## 2016-10-19 NOTE — ED Notes (Signed)
Patient transported to CT 

## 2016-10-19 NOTE — ED Provider Notes (Signed)
Pain dramatically improved. Now 5 out of 10. We'll transition to Percocet and discharged home to follow up with urology in the morning for further management.   Sharman CheekPhillip Khoa Opdahl, MD 10/19/16 2133

## 2016-10-19 NOTE — ED Triage Notes (Signed)
Pt states flank pain that radiates to his left ureter area since last night, pt states that he has had multiple stones in the past and is currently on antibiotics for a possible prostate infection

## 2016-10-20 ENCOUNTER — Ambulatory Visit: Payer: 59 | Admitting: Anesthesiology

## 2016-10-20 ENCOUNTER — Encounter
Admission: RE | Disposition: A | Discharge status: Home / Self Care | Payer: Self-pay | Source: Ambulatory Visit | Attending: Urology

## 2016-10-20 ENCOUNTER — Encounter: Payer: Self-pay | Admitting: *Deleted

## 2016-10-20 ENCOUNTER — Ambulatory Visit
Admission: RE | Admit: 2016-10-20 | Discharge: 2016-10-20 | Disposition: A | Discharge status: Home / Self Care | Payer: 59 | Source: Ambulatory Visit | Attending: Urology | Admitting: Urology

## 2016-10-20 DIAGNOSIS — N183 Chronic kidney disease, stage 3 (moderate): Secondary | ICD-10-CM | POA: Diagnosis not present

## 2016-10-20 DIAGNOSIS — E7800 Pure hypercholesterolemia, unspecified: Secondary | ICD-10-CM | POA: Diagnosis not present

## 2016-10-20 DIAGNOSIS — Z7984 Long term (current) use of oral hypoglycemic drugs: Secondary | ICD-10-CM | POA: Insufficient documentation

## 2016-10-20 DIAGNOSIS — Z7982 Long term (current) use of aspirin: Secondary | ICD-10-CM | POA: Insufficient documentation

## 2016-10-20 DIAGNOSIS — E1122 Type 2 diabetes mellitus with diabetic chronic kidney disease: Secondary | ICD-10-CM | POA: Insufficient documentation

## 2016-10-20 DIAGNOSIS — K760 Fatty (change of) liver, not elsewhere classified: Secondary | ICD-10-CM | POA: Insufficient documentation

## 2016-10-20 DIAGNOSIS — I129 Hypertensive chronic kidney disease with stage 1 through stage 4 chronic kidney disease, or unspecified chronic kidney disease: Secondary | ICD-10-CM | POA: Diagnosis not present

## 2016-10-20 DIAGNOSIS — N200 Calculus of kidney: Secondary | ICD-10-CM

## 2016-10-20 DIAGNOSIS — N132 Hydronephrosis with renal and ureteral calculous obstruction: Secondary | ICD-10-CM | POA: Insufficient documentation

## 2016-10-20 DIAGNOSIS — N201 Calculus of ureter: Secondary | ICD-10-CM | POA: Diagnosis not present

## 2016-10-20 HISTORY — PX: CYSTOSCOPY/URETEROSCOPY/HOLMIUM LASER/STENT PLACEMENT: SHX6546

## 2016-10-20 LAB — GLUCOSE, CAPILLARY
GLUCOSE-CAPILLARY: 155 mg/dL — AB (ref 65–99)
Glucose-Capillary: 120 mg/dL — ABNORMAL HIGH (ref 65–99)

## 2016-10-20 SURGERY — CYSTOSCOPY/URETEROSCOPY/HOLMIUM LASER/STENT PLACEMENT
Anesthesia: General | Site: Ureter | Laterality: Left | Wound class: Clean Contaminated

## 2016-10-20 MED ORDER — FENTANYL CITRATE (PF) 100 MCG/2ML IJ SOLN
INTRAMUSCULAR | Status: DC | PRN
  Administered 2016-10-20: 100 ug via INTRAVENOUS

## 2016-10-20 MED ORDER — DEXTROSE 5 % IV SOLN
INTRAVENOUS | Status: DC
  Filled 2016-10-20: qty 20

## 2016-10-20 MED ORDER — SODIUM CHLORIDE 0.9 % IV SOLN
INTRAVENOUS | Status: DC | PRN
  Administered 2016-10-20: 13:00:00 via INTRAVENOUS

## 2016-10-20 MED ORDER — IOTHALAMATE MEGLUMINE 43 % IV SOLN
INTRAVENOUS | Status: DC | PRN
  Administered 2016-10-20: 15 mL

## 2016-10-20 MED ORDER — MIDAZOLAM HCL 2 MG/2ML IJ SOLN
INTRAMUSCULAR | Status: DC | PRN
  Administered 2016-10-20: 2 mg via INTRAVENOUS
  Administered 2016-10-20: 1 mg via INTRAVENOUS

## 2016-10-20 MED ORDER — CEFAZOLIN SODIUM-DEXTROSE 2-4 GM/100ML-% IV SOLN
INTRAVENOUS | Status: AC
  Administered 2016-10-20: 2000 mg
  Filled 2016-10-20: qty 100

## 2016-10-20 MED ORDER — ONDANSETRON HCL 4 MG/2ML IJ SOLN: 4.0000 mg | Freq: Once | INTRAMUSCULAR | Status: DC | PRN

## 2016-10-20 MED ORDER — DOCUSATE SODIUM 100 MG PO CAPS: 100.0000 mg | ORAL_CAPSULE | Freq: Two times a day (BID) | ORAL | 0 refills | Status: DC

## 2016-10-20 MED ORDER — OXYBUTYNIN CHLORIDE 5 MG PO TABS: 5.0000 mg | ORAL_TABLET | Freq: Three times a day (TID) | ORAL | 0 refills | Status: DC | PRN

## 2016-10-20 MED ORDER — CEFAZOLIN (ANCEF) 1 G IV SOLR
2.0000 g | INTRAVENOUS | Status: DC
  Administered 2016-10-20: 2 g

## 2016-10-20 MED ORDER — LIDOCAINE HCL (CARDIAC) 20 MG/ML IV SOLN
INTRAVENOUS | Status: DC | PRN
  Administered 2016-10-20: 50 mg via INTRAVENOUS

## 2016-10-20 MED ORDER — ONDANSETRON HCL 4 MG/2ML IJ SOLN
INTRAMUSCULAR | Status: DC | PRN
  Administered 2016-10-20: 4 mg via INTRAVENOUS

## 2016-10-20 MED ORDER — PROPOFOL 10 MG/ML IV BOLUS
INTRAVENOUS | Status: DC | PRN
  Administered 2016-10-20: 200 mg via INTRAVENOUS

## 2016-10-20 MED ORDER — DEXAMETHASONE SODIUM PHOSPHATE 10 MG/ML IJ SOLN
INTRAMUSCULAR | Status: DC | PRN
  Administered 2016-10-20: 8 mg via INTRAVENOUS

## 2016-10-20 MED ORDER — FENTANYL CITRATE (PF) 100 MCG/2ML IJ SOLN: 25.0000 ug | INTRAMUSCULAR | Status: DC | PRN

## 2016-10-20 MED ORDER — PHENYLEPHRINE HCL 10 MG/ML IJ SOLN
INTRAMUSCULAR | Status: DC | PRN
  Administered 2016-10-20: 100 ug via INTRAVENOUS

## 2016-10-20 MED ORDER — OXYBUTYNIN CHLORIDE 5 MG PO TABS
5.0000 mg | ORAL_TABLET | Freq: Three times a day (TID) | ORAL | Status: DC | PRN
  Filled 2016-10-20: qty 1

## 2016-10-20 MED ORDER — TAMSULOSIN HCL 0.4 MG PO CAPS: 0.4000 mg | ORAL_CAPSULE | Freq: Every day | ORAL | 0 refills | Status: DC

## 2016-10-20 MED ORDER — GLYCOPYRROLATE 0.2 MG/ML IJ SOLN
INTRAMUSCULAR | Status: DC | PRN
  Administered 2016-10-20: 0.2 mg via INTRAVENOUS

## 2016-10-20 SURGICAL SUPPLY — 29 items
BAG DRAIN CYSTO-URO LG1000N (MISCELLANEOUS) ×3 IMPLANT
BASKET ZERO TIP 1.9FR (BASKET) ×3 IMPLANT
BRUSH SCRUB EZ 1% IODOPHOR (MISCELLANEOUS) ×3 IMPLANT
CATH URETL 5X70 OPEN END (CATHETERS) ×3 IMPLANT
CNTNR SPEC 2.5X3XGRAD LEK (MISCELLANEOUS)
CONRAY 43 FOR UROLOGY 50M (MISCELLANEOUS) ×3 IMPLANT
CONT SPEC 4OZ STER OR WHT (MISCELLANEOUS)
CONTAINER SPEC 2.5X3XGRAD LEK (MISCELLANEOUS) IMPLANT
DRAPE UTILITY 15X26 TOWEL STRL (DRAPES) ×3 IMPLANT
FIBER LASER LITHO 273 (Laser) ×3 IMPLANT
GLOVE BIO SURGEON STRL SZ 6.5 (GLOVE) ×2 IMPLANT
GLOVE BIO SURGEONS STRL SZ 6.5 (GLOVE) ×1
GOWN STRL REUS W/ TWL LRG LVL3 (GOWN DISPOSABLE) ×2 IMPLANT
GOWN STRL REUS W/TWL LRG LVL3 (GOWN DISPOSABLE) ×4
GUIDEWIRE SUPER STIFF (WIRE) IMPLANT
INFUSOR MANOMETER BAG 3000ML (MISCELLANEOUS) ×3 IMPLANT
INTRODUCER DILATOR DOUBLE (INTRODUCER) IMPLANT
KIT RM TURNOVER CYSTO AR (KITS) ×3 IMPLANT
PACK CYSTO AR (MISCELLANEOUS) ×3 IMPLANT
SENSORWIRE 0.038 NOT ANGLED (WIRE) ×3
SET CYSTO W/LG BORE CLAMP LF (SET/KITS/TRAYS/PACK) ×3 IMPLANT
SHEATH URETERAL 12FRX35CM (MISCELLANEOUS) IMPLANT
SOL .9 NS 3000ML IRR  AL (IV SOLUTION) ×2
SOL .9 NS 3000ML IRR UROMATIC (IV SOLUTION) ×1 IMPLANT
STENT URET 6FRX24 CONTOUR (STENTS) IMPLANT
STENT URET 6FRX26 CONTOUR (STENTS) ×3 IMPLANT
SURGILUBE 2OZ TUBE FLIPTOP (MISCELLANEOUS) ×3 IMPLANT
WATER STERILE IRR 1000ML POUR (IV SOLUTION) ×3 IMPLANT
WIRE SENSOR 0.038 NOT ANGLED (WIRE) ×1 IMPLANT

## 2016-10-20 NOTE — Discharge Instructions (Signed)
You have a ureteral stent in place.  This is a tube that extends from your kidney to your bladder.  This may cause urinary bleeding, burning with urination, and urinary frequency.  Please call our office or present to the ED if you develop fevers >101 or pain which is not able to be controlled with oral pain medications.  You may be given either Flomax and/ or ditropan to help with bladder spasms and stent pain in addition to pain medications.   ° °[facility] °[address], [address] °, [place] [address] °[phone] ° ° ° °AMBULATORY SURGERY  °DISCHARGE INSTRUCTIONS ° ° °1) The drugs that you were given will stay in your system until tomorrow so for the next 24 hours you should not: ° °A) Drive an automobile °B) Make any legal decisions °C) Drink any alcoholic beverage ° ° °2) You may resume regular meals tomorrow.  Today it is better to start with liquids and gradually work up to solid foods. ° °You may eat anything you prefer, but it is better to start with liquids, then soup and crackers, and gradually work up to solid foods. ° ° °3) Please notify your doctor immediately if you have any unusual bleeding, trouble breathing, redness and pain at the surgery site, drainage, fever, or pain not relieved by medication. ° ° ° °4) Additional Instructions: ° ° ° ° ° ° ° °Please contact your physician with any problems or Same Day Surgery at [phone], Monday through Friday 6 am to 4 pm, or [place] [facility] [place] Main number at [phone]. °

## 2016-10-20 NOTE — H&P (Signed)
10/20/16 10:26 AM   Enis SlipperLarry J Fenstermaker 04-06-1966 [id number]  Referring provider: Dr. Derrill KayGoodman  CC: left flank pain  HPI:  50 year old male known to our service with a history of recurrent nephrolithiasis. He presented to the emergency room yesterday evening with acute onset left flank pain. CT scanning showed severe left hydronephrosis and hydroureter down to the level of the distal ureter were multiple stones were seen and the largest measuring 10 x 8 mm.  In the emergency room, he had no signs or symptoms of infection. His pain was able to be controlled and arrangements were made for ureteroscopy, laser lithotripsy the following day.  He had been having some pain in the lower pelvis for the past 2 weeks prior to presenting to the emergency room. His primary care doctor was treating him for prostatitis. Ultimately, the pain radiated down to his left flank and left lower quadrant bringing him to the ER.  He denies any associated urinary symptoms. No gross hematuria or dysuria.  He continues to remain afebrile.  He does have a personal history of nephrolithiasis. He underwent ESWL in 2016 for a 2 cm left distal ureteral stone which is competent data by steinstrasse and ultimately requiring laser lithotripsy to clear the residual stone burden. Also seen in our office proximal leg 1 month ago some intermittent left lower abdominal pain radiating to his groin at which time his renal ultrasound was unremarkable.  PMH: Past Medical History:  Diagnosis Date  . Chest pain    a. 03/2016 Myoview: medium defect of mild severity in basal anteroseptal, mid anteroseptal, and apical septal locations - partially reversible;  b. Cath: nl cors, EF 55-65%. EDP 16 mmHg.  Marland Kitchen. Chronic back pain   . CKD (chronic kidney disease), stage III    [name]/notes 04/21/2016  . FH: stroke 09/05/2015  . High cholesterol   . Hx of hypotestosteronemia 09/05/2015  . Hyperglycemia 09/05/2015  . Hypertension 09/05/2015  .  Nephrolithiasis "several times"  . Plantar fasciitis, bilateral   . Uncontrolled type II diabetes mellitus [facility](HCC)     Surgical History: Past Surgical History:  Procedure Laterality Date  . ANTERIOR CERVICAL DECOMP/DISCECTOMY FUSION  2006  . CARDIAC CATHETERIZATION N/A 04/23/2016   Procedure: Left Heart Cath and Coronary Angiography;  Surgeon: Lyn RecordsHenry W Smith, MD;  [facility] INVASIVE CV LAB;  Service: Cardiovascular;  Laterality: N/A;  . CYSTOSCOPY W/ RETROGRADES Left 01/16/2016   Procedure: CYSTOSCOPY WITH RETROGRADE PYELOGRAM;  Surgeon: Hildred LaserBrian James Budzyn, MD;  Location: [facility] ORS;  Service: Urology;  Laterality: Left;  . CYSTOSCOPY WITH STENT PLACEMENT Left 12/05/2015   Procedure: CYSTOSCOPY WITH STENT PLACEMENT;  Surgeon: Hildred LaserBrian James Budzyn, MD;  Location: [facility] ORS;  Service: Urology;  Laterality: Left;  . CYSTOSCOPY WITH STENT PLACEMENT Left 01/16/2016   Procedure: CYSTOSCOPY WITH STENT PLACEMENT/ EXCHANGE;  Surgeon: Hildred LaserBrian James Budzyn, MD;  Location: [facility] ORS;  Service: Urology;  Laterality: Left;  . EXTRACORPOREAL SHOCK WAVE LITHOTRIPSY Left 09/20/2015   Procedure: EXTRACORPOREAL SHOCK WAVE LITHOTRIPSY (ESWL);  Surgeon: Hildred LaserBrian James Budzyn, MD;  Location: [facility] ORS;  Service: Urology;  Laterality: Left;  . EXTRACORPOREAL SHOCK WAVE LITHOTRIPSY Left 11/15/2015   Procedure: EXTRACORPOREAL SHOCK WAVE LITHOTRIPSY (ESWL);  Surgeon: Vanna ScotlandAshley Pacen Watford, MD;  Location: [facility] ORS;  Service: Urology;  Laterality: Left;  . URETEROSCOPY WITH HOLMIUM LASER LITHOTRIPSY Left 12/05/2015   Procedure: URETEROSCOPY WITH HOLMIUM LASER LITHOTRIPSY;  Surgeon: Hildred LaserBrian James Budzyn, MD;  Location: [facility] ORS;  Service: Urology;  Laterality: Left;  . URETEROSCOPY WITH  HOLMIUM LASER LITHOTRIPSY Left 01/16/2016   Procedure: URETEROSCOPY WITH HOLMIUM LASER LITHOTRIPSY;  Surgeon: Hildred Laser, MD;  Location: [facility] ORS;  Service: Urology;  Laterality: Left;    Home Medications:  Current Meds  Medication Sig  .  HYDROcodone-acetaminophen (NORCO/VICODIN) 5-325 MG tablet Take 1-2 tablets by mouth every 6 (six) hours as needed.  . ondansetron (ZOFRAN) 4 MG tablet Take 1 tablet (4 mg total) by mouth every 8 (eight) hours as needed.  Marland Kitchen oxyCODONE-acetaminophen (ROXICET) 5-325 MG tablet Take 1 tablet by mouth every 4 (four) hours as needed for severe pain.     Allergies: No Known Allergies  Family History: Family History  Problem Relation Age of Onset  . Stroke Father   . Nephrolithiasis Father   . Prostate cancer Neg Hx   . Bladder Cancer Neg Hx     Social History:  reports that he has never smoked. He has never used smokeless tobacco. He reports that he does not drink alcohol or use drugs.  ROS: 12 point review of systems negative other than as per history of present illness.  Physical Exam: BP (!) 144/80   Pulse 66   Temp 97.8 F (36.6 C) (Oral)   Resp 20   Ht 6' (1.829 m)   Wt 212 lb (96.2 kg)   SpO2 96%   BMI 28.75 kg/m   Constitutional:  Alert and oriented, No acute distress. HEENT: [place] AT, moist mucus membranes.  Trachea midline, no masses. Cardiovascular: No clubbing, cyanosis, or edema. RRR. Respiratory: Normal respiratory effort, no increased work of breathing.  CTAB. GI: Abdomen is soft, nontender, nondistended, no abdominal masses GU: No CVA tenderness.  Skin: No rashes, bruises or suspicious lesions. Neurologic: Grossly intact, no focal deficits, moving all 4 extremities. Psychiatric: Normal mood and affect.  Laboratory Data: Lab Results  Component Value Date   WBC 7.8 10/19/2016   HGB 14.4 10/19/2016   HCT 40.6 10/19/2016   MCV 87.5 10/19/2016   PLT 188 10/19/2016    Lab Results  Component Value Date   CREATININE 1.52 (H) 10/19/2016     Lab Results  Component Value Date   TESTOSTERONE 244 (L) 09/05/2015    Lab Results  Component Value Date   HGBA1C 6.8 (H) 09/23/2016    Urinalysis Component     Latest Ref Rng & Units 10/19/2016          pH      5.0 - 8.0 TEST NOT REPORTED DUE TO COLOR INTERFERENCE OF URINE PIGMENT  Protein     NEGATIVE mg/dL TEST NOT REPORTED DUE TO COLOR INTERFERENCE OF URINE PIGMENT (A)  Nitrite     NEGATIVE TEST NOT REPORTED DUE TO COLOR INTERFERENCE OF URINE PIGMENT (A)  Mucous      PRESENT  Amorphous Crystal        Color, Urine     YELLOW ORANGE (A)  Appearance     CLEAR CLEAR (A)  Glucose     NEGATIVE mg/dL TEST NOT REPORTED DUE TO COLOR INTERFERENCE OF URINE PIGMENT (A)  Bilirubin Urine     NEGATIVE TEST NOT REPORTED DUE TO COLOR INTERFERENCE OF URINE PIGMENT (A)  Ketones, ur     NEGATIVE mg/dL TEST NOT REPORTED DUE TO COLOR INTERFERENCE OF URINE PIGMENT (A)  Specific Gravity, Urine     1.005 - 1.030 1.024  Hgb urine dipstick     NEGATIVE TEST NOT REPORTED DUE TO COLOR INTERFERENCE OF URINE PIGMENT (A)  Leukocytes, UA  NEGATIVE TEST NOT REPORTED DUE TO COLOR INTERFERENCE OF URINE PIGMENT (A)  RBC / HPF     0 - 5 RBC/hpf 6-30  WBC, UA     0 - 5 WBC/hpf 0-5  Bacteria, UA     NONE SEEN NONE SEEN  Squamous Epithelial / LPF     NONE SEEN 0-5 (A)    Pertinent Imaging: CLINICAL DATA:  50 year old male with left flank, abdomen and pelvis pain. Personal history of lithotripsy and stent placement. Initial encounter.  EXAM: CT ABDOMEN AND PELVIS WITHOUT CONTRAST  TECHNIQUE: Multidetector CT imaging of the abdomen and pelvis was performed following the standard protocol without IV contrast.  COMPARISON:  Renal ultrasound 10/03/2016. KUB 09/26/2016 and earlier. CT Abdomen and Pelvis 03/04/2013.  FINDINGS: Lower chest: Negative.  No pericardial or pleural effusion.  No upper abdominal free air.  Hepatobiliary: Chronic hepatic steatosis, less pronounced but not resolved compared to 2014. Negative noncontrast gallbladder.  Pancreas: Negative.  Spleen: Negative.  Adrenals/Urinary Tract: Negative adrenal glands. Negative right kidney and right ureter. Decompressed and negative  urinary bladder.  Severe left hydronephrosis and left hydroureter to the level of the distal left ureter along the pelvic side wall where a cluster of multiple urologic stones are demonstrated. See coronal image 87. The largest of these is 10 by 8 mm. Two large and 1 or 2 punctate stones are present. No other urologic calculus identified. The most distal stone is located about 18 mm from the ureterovesical junction.  Stomach/Bowel: Negative distal colon. Decompressed left colon and splenic flexure. Mild diverticulosis at the hepatic flexure. Mild retained stool in the right colon. Negative appendix. Negative terminal ileum. No dilated small bowel. Negative stomach and duodenum.  Vascular/Lymphatic: No calcified aortic atherosclerosis. Vascular patency not evaluated in the absence of IV contrast.  No lymphadenopathy.  Reproductive: Negative.  Other: No pelvic free fluid.  Musculoskeletal: No acute osseous abnormality identified.  IMPRESSION: 1. Acute obstructive uropathy on the left with a cluster of obstructing stones in the distal left ureter. The largest and most distal stone is 10 x 8 mm, located about 18 mm from the left UVJ. See coronal image 87. 2. No other urologic calculus identified. 3. Chronic fatty liver disease.   Electronically Signed   By: Odessa FlemingH  Hall M.D.   On: 10/19/2016 20:29  CT scan personally reviewed  Assessment & Plan:    1. Left ureteral stone/ flank pain- significant left distal ureteral stone burden with obstruction. I have recommended left ureteroscopy, laser lithotripsy, left ureteral stent placement to treat this stone burden. Risk and benefits were discussed in detail. He's had this procedure before and is familiar. All his questions were answered.  2.  Left hydroureteronephrosis-secondary to #1  3. Left renal atrophy- suspect this is chronic from previous 2 cm left distal stone in the past.   Vanna ScotlandAshley Kilynn Fitzsimmons, MD  [facility] [address], [address] [place], [place] [address] [phone]

## 2016-10-20 NOTE — Transfer of Care (Signed)
Immediate Anesthesia Transfer of Care Note  Patient: Brian Butler  Procedure(s) Performed: Procedure(s): CYSTOSCOPY/URETEROSCOPY/HOLMIUM LASER/STENT PLACEMENT (Left)  Patient Location: PACU  Anesthesia Type:General  Level of Consciousness: responds to stimulation  Airway & Oxygen Therapy: Patient Spontanous Breathing and Patient connected to nasal cannula oxygen  Post-op Assessment: Report given to RN and Post -op Vital signs reviewed and stable  Post vital signs: Reviewed and stable  Last Vitals:  Vitals:   10/20/16 1012 10/20/16 1449  BP: (!) 144/80 132/81  Pulse: 66 91  Resp: 20 (!) 9  Temp: 36.6 C 36.6 C    Last Pain:  Vitals:   10/20/16 1449  TempSrc:   PainSc: Asleep         Complications: No apparent anesthesia complications

## 2016-10-20 NOTE — Anesthesia Preprocedure Evaluation (Addendum)
Anesthesia Evaluation  Patient identified by MRN, date of birth, ID band Patient awake    Reviewed: Allergy & Precautions, NPO status , Patient's Chart, lab work & pertinent test results, reviewed documented beta blocker date and time   Airway Mallampati: III  TM Distance: >3 FB     Dental  (+) Chipped   Pulmonary           Cardiovascular hypertension, Pt. on medications + angina      Neuro/Psych  Neuromuscular disease    GI/Hepatic   Endo/Other  diabetes, Type 2  Renal/GU Renal disease     Musculoskeletal   Abdominal   Peds  Hematology   Anesthesia Other Findings EF 60. Neck movement ok. Hypertensive this am.  Reproductive/Obstetrics                            Anesthesia Physical Anesthesia Plan  ASA: III  Anesthesia Plan: General   Post-op Pain Management:    Induction: Intravenous  Airway Management Planned: Oral ETT  Additional Equipment:   Intra-op Plan:   Post-operative Plan:   Informed Consent: I have reviewed the patients History and Physical, chart, labs and discussed the procedure including the risks, benefits and alternatives for the proposed anesthesia with the patient or authorized representative who has indicated his/her understanding and acceptance.     Plan Discussed with: CRNA  Anesthesia Plan Comments:         Anesthesia Quick Evaluation

## 2016-10-20 NOTE — Op Note (Signed)
Date of procedure: 10/20/16  Preoperative diagnosis:  1. Left distal ureteral stones 2. Left flank pain 3. Left hydroureteronephrosis   Postoperative diagnosis:  1. Same as above   Procedure: 1. Left ureteroscopy 2. Laser lithotripsy 3. Left ureteral stent placement 4. Basket extraction of Stone fragment  Surgeon: Vanna ScotlandAshley Kalon Erhardt, MD  Anesthesia: General  Complications: None  Intraoperative findings: Multiple left ureteral stones, largest 1 cm  EBL: Minimal  Specimens: none  Drains: 6 x 26 French double-J ureteral stent on left  Indication: Brian Butler is a 50 y.o. patient with severe left flank pain found to have multiple left distal ureteral stones measuring up to 1 cm in size.  After reviewing the management options for treatment, he elected to proceed with the above surgical procedure(s). We have discussed the potential benefits and risks of the procedure, side effects of the proposed treatment, the likelihood of the patient achieving the goals of the procedure, and any potential problems that might occur during the procedure or recuperation. Informed consent has been obtained.  Description of procedure:  The patient was taken to the operating room and general anesthesia was induced.  The patient was placed in the dorsal lithotomy position, prepped and draped in the usual sterile fashion, and preoperative antibiotics were administered. A preoperative time-out was performed.   A 21 French cystoscope was advanced per urethra into the bladder. Attention was turned to the left ureteral orifice which was cannulated using a sensor wire up to level of the kidney. Of note, multiple stones could be seen on fluoroscopy within the distal ureter. Upon placement of the wire, a large amount of stone dust and debris effluxed from the left UO. The safety wire was then snapped in place. A semirigid 4.5 French ureteroscope was then advanced alongside the wire to the distal ureter. There was  one significantly narrow portion of the distal ureter which was able to be easily traversed using the ureteroscope. A 273  laser fiber was then brought in and the 2 largest stones were obliterated into small fragments. The fragments were then removed in a piece wise fashion from the distal ureter using a 0.9 [place]French nitinol basket. A few of the fragments were too large to pass through the bottle neck area of the ureter and there fragmented into smaller pieces. Ultimately, I was able to clear all of the stone burden from the distal ureter. The scope was then able to be advanced just past the iliacs and no significant residual stone burden was identified. The scope was then removed. The safety wire was then backloaded over a rigid cystoscope. A 6 x 26 French double-J ureteral stent was advanced up to level of the kidney. The wire was partially withdrawn until full coil was noted within the renal pelvis. The wire was then fully withdrawn and a full coil was noted in the bladder. The stone particles were then evacuated from the bladder and the scope was removed. The patient was then cleaned and dried, repositioned the supine position, reversed anesthesia, and taken the PACU in stable condition.  Plan: Patient will follow-up next week for cystoscopy, stent removal. No additional narcotics were given today as he receives 15 tablets of oxycodone yesterday in the emergency room.  Vanna ScotlandAshley Whitfield Dulay, M.D.

## 2016-10-20 NOTE — Anesthesia Postprocedure Evaluation (Signed)
Anesthesia Post Note  Patient: Enis SlipperLarry J Ducey  Procedure(s) Performed: Procedure(s) (LRB): CYSTOSCOPY/URETEROSCOPY/HOLMIUM LASER/STENT PLACEMENT (Left)  Patient location during evaluation: PACU Anesthesia Type: General Level of consciousness: awake and alert Pain management: pain level controlled Vital Signs Assessment: post-procedure vital signs reviewed and stable Respiratory status: spontaneous breathing, nonlabored ventilation, respiratory function stable and patient connected to nasal cannula oxygen Cardiovascular status: blood pressure returned to baseline and stable Postop Assessment: no signs of nausea or vomiting Anesthetic complications: no    Last Vitals:  Vitals:   10/20/16 1449 10/20/16 1504  BP: 132/81 (!) 102/41  Pulse: 91 80  Resp: (!) 9 12  Temp: 36.6 C     Last Pain:  Vitals:   10/20/16 1504  TempSrc:   PainSc: Asleep                 Jet Armbrust S

## 2016-10-20 NOTE — Anesthesia Procedure Notes (Signed)
Procedure Name: LMA Insertion Date/Time: 10/20/2016 1:23 PM Performed by: Marlana SalvageJESSUP, Jocilyn Trego Pre-anesthesia Checklist: Patient identified, Emergency Drugs available, Suction available, Patient being monitored and Timeout performed Patient Re-evaluated:Patient Re-evaluated prior to inductionOxygen Delivery Method: Circle system utilized Preoxygenation: Pre-oxygenation with 100% oxygen Intubation Type: IV induction Ventilation: Mask ventilation without difficulty LMA: LMA inserted LMA Size: 4.0 Placement Confirmation: positive ETCO2 and breath sounds checked- equal and bilateral Dental Injury: Teeth and Oropharynx as per pre-operative assessment

## 2016-10-21 ENCOUNTER — Encounter: Payer: Self-pay | Admitting: Urology

## 2016-10-28 ENCOUNTER — Ambulatory Visit (INDEPENDENT_AMBULATORY_CARE_PROVIDER_SITE_OTHER): Payer: 59 | Admitting: Urology

## 2016-10-28 VITALS — BP 125/78 | HR 96 | Ht 72.0 in | Wt 223.0 lb

## 2016-10-28 DIAGNOSIS — N132 Hydronephrosis with renal and ureteral calculous obstruction: Secondary | ICD-10-CM

## 2016-10-28 DIAGNOSIS — N200 Calculus of kidney: Secondary | ICD-10-CM

## 2016-10-28 LAB — URINALYSIS, COMPLETE
Bilirubin, UA: NEGATIVE
KETONES UA: NEGATIVE
LEUKOCYTES UA: NEGATIVE
Nitrite, UA: NEGATIVE
Urobilinogen, Ur: 0.2 mg/dL (ref 0.2–1.0)
pH, UA: 5 (ref 5.0–7.5)

## 2016-10-28 LAB — MICROSCOPIC EXAMINATION: RBC, UA: >30 /hpf — AB (ref 0–?)

## 2016-10-28 MED ORDER — LIDOCAINE HCL 2 % EX GEL
1.0000 "application " | Freq: Once | CUTANEOUS | Status: AC
  Administered 2016-10-28: 1 via URETHRAL

## 2016-10-28 MED ORDER — CIPROFLOXACIN HCL 500 MG PO TABS
500.0000 mg | ORAL_TABLET | Freq: Once | ORAL | Status: AC
  Administered 2016-10-28: 500 mg via ORAL

## 2016-10-28 NOTE — Progress Notes (Signed)
   10/28/16  CC:  Chief Complaint  Patient presents with  . Cysto Stent Removal    HPI: 50 year old male status post left ureteroscopy, laser lithotripsy, stent placement 1 week ago for obstructing left ureteral stones. No complaints today including no fevers or chills. Very anxious about having stent removed today.  He does have a history of recurrent stones in the past including a history of a 2 cm left distal ureteral stone approximately one year ago.    Blood pressure 125/78, pulse 96, height 6' (1.829 m), weight 223 lb (101.2 kg). NED. A&Ox3.   No respiratory distress   Abd soft, NT, ND Normal phallus with bilateral descended testicles  Cystoscopy/ Stent removal procedure  Patient identification was confirmed, informed consent was obtained, and patient was prepped using Betadine solution.  Lidocaine jelly was administered per urethral meatus.    Preoperative abx where received prior to procedure.    Procedure: - Flexible cystoscope introduced, without any difficulty.   - Thorough search of the bladder revealed:    normal urethral meatus  Stent seen emanating from left ureteral orifice, grasped with stent graspers, and removed in entirety.    Of note, distal coil of stent was fairly significantly encrusted after only one week.  Post-Procedure: - Patient tolerated the procedure well  Assessment/ Plan:  1. Nephrolithiasis S/p L URS last week, stent removal today Voiding symptoms reviewed Recommended 24 urine metabolic work up given frequency/ size of stones, and she options reviewed today, we'll perform prior to next follow-up visit - Urinalysis, Complete - ciprofloxacin (CIPRO) tablet 500 mg; Take 1 tablet (500 mg total) by mouth once. - lidocaine (XYLOCAINE) 2 % jelly 1 application; Place 1 application into the urethra once. - US Renal; Future  2. Hydronephrosis with urinary obstruction due to ureteral calculus We'll follow up with a renal ultrasound in one month  to assess for resolution  Return in about 2 months (around 12/28/2016) for f/u 24 urine results, f/u RUS.   Vanna ScotlandAshley Monti Jilek, MD

## 2016-11-26 ENCOUNTER — Other Ambulatory Visit: Payer: 59

## 2016-11-26 DIAGNOSIS — N200 Calculus of kidney: Secondary | ICD-10-CM | POA: Diagnosis not present

## 2016-11-27 ENCOUNTER — Ambulatory Visit: Payer: 59

## 2016-12-03 ENCOUNTER — Encounter: Payer: Self-pay | Admitting: Internal Medicine

## 2016-12-03 ENCOUNTER — Ambulatory Visit (INDEPENDENT_AMBULATORY_CARE_PROVIDER_SITE_OTHER): Payer: 59 | Admitting: Internal Medicine

## 2016-12-03 VITALS — BP 132/92 | HR 82 | Temp 97.0°F | Ht 72.0 in | Wt 224.0 lb

## 2016-12-03 DIAGNOSIS — E1122 Type 2 diabetes mellitus with diabetic chronic kidney disease: Secondary | ICD-10-CM | POA: Diagnosis not present

## 2016-12-03 DIAGNOSIS — G8929 Other chronic pain: Secondary | ICD-10-CM

## 2016-12-03 DIAGNOSIS — N183 Chronic kidney disease, stage 3 unspecified: Secondary | ICD-10-CM

## 2016-12-03 DIAGNOSIS — E1142 Type 2 diabetes mellitus with diabetic polyneuropathy: Secondary | ICD-10-CM | POA: Diagnosis not present

## 2016-12-03 DIAGNOSIS — M545 Low back pain: Secondary | ICD-10-CM

## 2016-12-03 DIAGNOSIS — I10 Essential (primary) hypertension: Secondary | ICD-10-CM

## 2016-12-03 MED ORDER — DULOXETINE HCL 60 MG PO CPEP: 60.0000 mg | ORAL_CAPSULE | Freq: Every day | ORAL | 0 refills | Status: DC

## 2016-12-03 NOTE — Progress Notes (Signed)
Date:  12/03/2016   Name:  Brian Butler   DOB:  07-28-1966   MRN:  [id number]   Chief Complaint: Diabetes and Leg Pain Diabetes  He presents for his follow-up diabetic visit. He has type 2 diabetes mellitus. His disease course has been stable. Associated symptoms include weakness. Pertinent negatives for diabetes include no chest pain. Diabetic complications include peripheral neuropathy. Current diabetic treatment includes oral agent (monotherapy). He is compliant with treatment most of the time. His breakfast blood glucose is taken between 6-7 am. His breakfast blood glucose range is generally 90-110 mg/dl. (One reading was 63 - his wife is very concerned and wants him to see Endocrinology.  He does not really want to be referred at this time.)  Leg Pain   Injury mechanism: felt to be DM neuropathy. The pain is severe. The pain has been fluctuating since onset. Associated symptoms include numbness. The symptoms are aggravated by weight bearing. Treatments tried: gabapentin.  Hypertension  This is a chronic problem. The current episode started more than 1 year ago. The problem is unchanged. The problem is controlled. Pertinent negatives include no chest pain or shortness of breath.  Groin strain - he has some discomfort inner left thigh.  It has been harder to walk at times so he is using a cane today.  He feels like it is persistent. Back pain - walking with cane today.  No point tenderness.  He wants to see chiropractor.  I think this is reasonable with certain precautions.  Lab Results  Component Value Date   HGBA1C 6.8 (H) 09/23/2016    Review of Systems  Constitutional: Negative for chills and fever.  Eyes: Negative for visual disturbance.  Respiratory: Negative for cough, chest tightness and shortness of breath.   Cardiovascular: Negative for chest pain.  Gastrointestinal: Negative for abdominal pain and blood in stool.  Genitourinary: Negative for difficulty urinating,  frequency, hematuria, penile pain, penile swelling, scrotal swelling and testicular pain.  Musculoskeletal: Positive for arthralgias, back pain and myalgias (leg and foot pain).  Neurological: Positive for weakness and numbness.    Patient Active Problem List   Diagnosis Date Noted  . Type 2 diabetes mellitus with stage 3 chronic kidney disease, without long-term current use of insulin (HCC) 09/18/2016  . Biceps tendonitis on left 08/07/2016  . Pigmented skin lesion of uncertain nature 08/07/2016  . Neck muscle spasm 08/07/2016  . DM type 2 with diabetic peripheral neuropathy (HCC) 08/07/2016  . Plantar fasciitis 05/30/2016  . Chronic heel pain 05/30/2016  . Hyperlipidemia associated with type 2 diabetes mellitus (HCC) 04/30/2016  . Chronic low back pain 04/30/2016  . Overweight (BMI 25.0-29.9) 04/30/2016  . Abnormal nuclear cardiac imaging test 04/23/2016  . Unstable angina (HCC)   . Essential hypertension 09/05/2015  . Nephrolithiasis 09/05/2015  . Hx of hypotestosteronemia 09/05/2015    Prior to Admission medications   Medication Sig Start Date End Date Taking? Authorizing Provider  aspirin 325 MG EC tablet Take 1 tablet (325 mg total) by mouth daily. 04/23/16  Yes Dorothea Ogle, MD  atorvastatin (LIPITOR) 10 MG tablet Take 1 tablet (10 mg total) by mouth daily at 6 PM. 08/07/16  Yes Reubin Milan, MD  docusate sodium (COLACE) 100 MG capsule Take 1 capsule (100 mg total) by mouth 2 (two) times daily. 10/20/16  Yes Vanna Scotland, MD  gabapentin (NEURONTIN) 300 MG capsule Take 1 capsule (300 mg total) by mouth 2 (two) times daily. And take 2  at bedtime 09/23/16  Yes Reubin Milan, MD  lisinopril (PRINIVIL,ZESTRIL) 10 MG tablet Take 1 tablet (10 mg total) by mouth daily. 06/16/16  Yes Duanne Limerick, MD  meloxicam (MOBIC) 15 MG tablet Take 1 tablet (15 mg total) by mouth daily. 08/07/16  Yes Reubin Milan, MD  metFORMIN (GLUCOPHAGE) 500 MG tablet Take 1 tablet (500 mg total) by  mouth 2 (two) times daily with a meal. 06/16/16  Yes Duanne Limerick, MD  oxybutynin (DITROPAN) 5 MG tablet Take 1 tablet (5 mg total) by mouth every 8 (eight) hours as needed for bladder spasms. 10/20/16  Yes Vanna Scotland, MD    No Known Allergies  Past Surgical History:  Procedure Laterality Date  . ANTERIOR CERVICAL DECOMP/DISCECTOMY FUSION  2006  . CARDIAC CATHETERIZATION N/A 04/23/2016   Procedure: Left Heart Cath and Coronary Angiography;  Surgeon: Lyn Records, MD;  Location: [facility] INVASIVE CV LAB;  Service: Cardiovascular;  Laterality: N/A;  . CYSTOSCOPY W/ RETROGRADES Left 01/16/2016   Procedure: CYSTOSCOPY WITH RETROGRADE PYELOGRAM;  Surgeon: Hildred Laser, MD;  Location: [facility] ORS;  Service: Urology;  Laterality: Left;  . CYSTOSCOPY WITH STENT PLACEMENT Left 12/05/2015   Procedure: CYSTOSCOPY WITH STENT PLACEMENT;  Surgeon: Hildred Laser, MD;  Location: [facility] ORS;  Service: Urology;  Laterality: Left;  . CYSTOSCOPY WITH STENT PLACEMENT Left 01/16/2016   Procedure: CYSTOSCOPY WITH STENT PLACEMENT/ EXCHANGE;  Surgeon: Hildred Laser, MD;  Location: [facility] ORS;  Service: Urology;  Laterality: Left;  . CYSTOSCOPY/URETEROSCOPY/HOLMIUM LASER/STENT PLACEMENT Left 10/20/2016   Procedure: CYSTOSCOPY/URETEROSCOPY/HOLMIUM LASER/STENT PLACEMENT;  Surgeon: Vanna Scotland, MD;  Location: [facility] ORS;  Service: Urology;  Laterality: Left;  . EXTRACORPOREAL SHOCK WAVE LITHOTRIPSY Left 09/20/2015   Procedure: EXTRACORPOREAL SHOCK WAVE LITHOTRIPSY (ESWL);  Surgeon: Hildred Laser, MD;  Location: [facility] ORS;  Service: Urology;  Laterality: Left;  . EXTRACORPOREAL SHOCK WAVE LITHOTRIPSY Left 11/15/2015   Procedure: EXTRACORPOREAL SHOCK WAVE LITHOTRIPSY (ESWL);  Surgeon: Vanna Scotland, MD;  Location: [facility] ORS;  Service: Urology;  Laterality: Left;  . URETEROSCOPY WITH HOLMIUM LASER LITHOTRIPSY Left 12/05/2015   Procedure: URETEROSCOPY WITH HOLMIUM LASER LITHOTRIPSY;  Surgeon: Hildred Laser, MD;   Location: [facility] ORS;  Service: Urology;  Laterality: Left;  . URETEROSCOPY WITH HOLMIUM LASER LITHOTRIPSY Left 01/16/2016   Procedure: URETEROSCOPY WITH HOLMIUM LASER LITHOTRIPSY;  Surgeon: Hildred Laser, MD;  Location: [facility] ORS;  Service: Urology;  Laterality: Left;    Social History  Substance Use Topics  . Smoking status: Never Smoker  . Smokeless tobacco: Never Used  . Alcohol use No     Medication list has been reviewed and updated.   Physical Exam  Constitutional: He appears well-developed and well-nourished.  Neck: Normal range of motion. Neck supple. No thyromegaly present.  Cardiovascular: Normal rate, regular rhythm and normal heart sounds.   Pulmonary/Chest: Effort normal and breath sounds normal. No respiratory distress. He has no wheezes.  Musculoskeletal: He exhibits no edema.       Left hip: He exhibits tenderness (medial upper thigh/groin). He exhibits no swelling.  Skin: Skin is warm and dry. He is not diaphoretic.  Psychiatric: His speech is normal. His affect is blunt. Cognition and memory are normal.    BP (!) 132/92   Pulse 82   Temp 97 F (36.1 C)   Ht 6' (1.829 m)   Wt 224 lb (101.6 kg)   SpO2 98%   BMI 30.38 kg/m   Assessment and Plan: 1. DM type 2  with diabetic peripheral neuropathy (HCC) Will add cymbalta and re-evaluate next visit Continue gabapentin - DULoxetine (CYMBALTA) 60 MG capsule; Take 1 capsule (60 mg total) by mouth at bedtime.  Dispense: 90 capsule; Refill: 0  2. Type 2 diabetes mellitus with stage 3 chronic kidney disease, without long-term current use of insulin (HCC) Last A1C 6.8 Will recheck next visit and adjust medication Patient to call if BS consistently elevated  3. Essential hypertension controlled  4. Chronic midline low back pain, with sciatica presence unspecified Considering seeing chiropractor - cautions discussed   Bari EdwardLaura Deo Mehringer, MD [facility]  12/03/2016

## 2016-12-15 ENCOUNTER — Other Ambulatory Visit: Payer: Self-pay | Admitting: Urology

## 2016-12-17 ENCOUNTER — Ambulatory Visit: Payer: 59 | Admitting: Internal Medicine

## 2016-12-29 ENCOUNTER — Ambulatory Visit
Admission: RE | Admit: 2016-12-29 | Discharge: 2016-12-29 | Disposition: A | Discharge status: Home / Self Care | Payer: 59 | Source: Ambulatory Visit | Attending: Urology | Admitting: Urology

## 2016-12-29 DIAGNOSIS — N200 Calculus of kidney: Secondary | ICD-10-CM | POA: Diagnosis not present

## 2017-01-02 ENCOUNTER — Ambulatory Visit: Payer: 59 | Admitting: Urology

## 2017-01-05 ENCOUNTER — Ambulatory Visit
Admission: RE | Admit: 2017-01-05 | Discharge: 2017-01-05 | Disposition: A | Discharge status: Home / Self Care | Payer: 59 | Source: Ambulatory Visit | Attending: Internal Medicine | Admitting: Internal Medicine

## 2017-01-05 ENCOUNTER — Ambulatory Visit (INDEPENDENT_AMBULATORY_CARE_PROVIDER_SITE_OTHER): Payer: 59 | Admitting: Internal Medicine

## 2017-01-05 ENCOUNTER — Encounter: Payer: Self-pay | Admitting: Internal Medicine

## 2017-01-05 VITALS — BP 146/98 | HR 76 | Temp 97.9°F | Ht 72.0 in | Wt 226.0 lb

## 2017-01-05 DIAGNOSIS — G8929 Other chronic pain: Secondary | ICD-10-CM | POA: Diagnosis not present

## 2017-01-05 DIAGNOSIS — S59902A Unspecified injury of left elbow, initial encounter: Secondary | ICD-10-CM | POA: Diagnosis not present

## 2017-01-05 DIAGNOSIS — N183 Chronic kidney disease, stage 3 (moderate): Secondary | ICD-10-CM | POA: Diagnosis not present

## 2017-01-05 DIAGNOSIS — M25531 Pain in right wrist: Secondary | ICD-10-CM | POA: Diagnosis not present

## 2017-01-05 DIAGNOSIS — M25522 Pain in left elbow: Secondary | ICD-10-CM | POA: Insufficient documentation

## 2017-01-05 DIAGNOSIS — I10 Essential (primary) hypertension: Secondary | ICD-10-CM | POA: Diagnosis not present

## 2017-01-05 DIAGNOSIS — M545 Low back pain: Secondary | ICD-10-CM | POA: Diagnosis not present

## 2017-01-05 DIAGNOSIS — E1142 Type 2 diabetes mellitus with diabetic polyneuropathy: Secondary | ICD-10-CM | POA: Diagnosis not present

## 2017-01-05 DIAGNOSIS — S6991XA Unspecified injury of right wrist, hand and finger(s), initial encounter: Secondary | ICD-10-CM | POA: Diagnosis not present

## 2017-01-05 DIAGNOSIS — E1122 Type 2 diabetes mellitus with diabetic chronic kidney disease: Secondary | ICD-10-CM

## 2017-01-05 NOTE — Progress Notes (Signed)
Date:  01/05/2017   Name:  Brian Butler   DOB:  [birth year]   MRN:  [id number]   Chief Complaint: Wrist Injury (Pt stated fell during the snow and injured both arms, wrist and back) and Diabetes Wrist Injury   The incident occurred more than 1 week ago. The incident occurred at home. The injury mechanism was a fall. The pain is present in the right wrist and left elbow. The quality of the pain is described as aching and stabbing. The pain is moderate. The pain has been constant since the incident. Pertinent negatives include no chest pain.  Diabetes  He presents for his follow-up diabetic visit. He has type 2 diabetes mellitus. His disease course has been stable. Pertinent negatives for diabetes include no chest pain and no fatigue.  He tried cymbalta for neuropathy but it caused insomnia and agitation.  Lab Results  Component Value Date   HGBA1C 6.8 (H) 09/23/2016      Review of Systems  Constitutional: Negative for chills and fatigue.  Respiratory: Negative for cough, chest tightness, shortness of breath and wheezing.   Cardiovascular: Negative for chest pain and palpitations.  Musculoskeletal: Positive for arthralgias, back pain, gait problem and joint swelling.  Skin: Negative for color change.    Patient Active Problem List   Diagnosis Date Noted  . Type 2 diabetes mellitus with stage 3 chronic kidney disease, without long-term current use of insulin (HCC) 09/18/2016  . Biceps tendonitis on left 08/07/2016  . Pigmented skin lesion of uncertain nature 08/07/2016  . Neck muscle spasm 08/07/2016  . DM type 2 with diabetic peripheral neuropathy (HCC) 08/07/2016  . Plantar fasciitis 05/30/2016  . Chronic heel pain 05/30/2016  . Hyperlipidemia associated with type 2 diabetes mellitus (HCC) 04/30/2016  . Chronic low back pain 04/30/2016  . Overweight (BMI 25.0-29.9) 04/30/2016  . Abnormal nuclear cardiac imaging test 04/23/2016  . Unstable angina (HCC)   . Essential  hypertension 09/05/2015  . Nephrolithiasis 09/05/2015  . Hx of hypotestosteronemia 09/05/2015    Prior to Admission medications   Medication Sig Start Date End Date Taking? Authorizing Provider  aspirin 325 MG EC tablet Take 1 tablet (325 mg total) by mouth daily. 04/23/16  Yes Dorothea Ogle, MD  atorvastatin (LIPITOR) 10 MG tablet Take 1 tablet (10 mg total) by mouth daily at 6 PM. 08/07/16  Yes Reubin Milan, MD  docusate sodium (COLACE) 100 MG capsule Take 1 capsule (100 mg total) by mouth 2 (two) times daily. 10/20/16  Yes Vanna Scotland, MD  gabapentin (NEURONTIN) 300 MG capsule Take 1 capsule (300 mg total) by mouth 2 (two) times daily. And take 2 at bedtime 09/23/16  Yes Reubin Milan, MD  lisinopril (PRINIVIL,ZESTRIL) 10 MG tablet Take 1 tablet (10 mg total) by mouth daily. 06/16/16  Yes Duanne Limerick, MD  meloxicam (MOBIC) 15 MG tablet Take 1 tablet (15 mg total) by mouth daily. 08/07/16  Yes Reubin Milan, MD  metFORMIN (GLUCOPHAGE) 500 MG tablet Take 1 tablet (500 mg total) by mouth 2 (two) times daily with a meal. 06/16/16  Yes Duanne Limerick, MD  oxybutynin (DITROPAN) 5 MG tablet Take 1 tablet (5 mg total) by mouth every 8 (eight) hours as needed for bladder spasms. 10/20/16  Yes Vanna Scotland, MD    Allergies  Allergen Reactions  . Cymbalta [Duloxetine Hcl] Other (See Comments)    insomnia    Past Surgical History:  Procedure Laterality Date  .  ANTERIOR CERVICAL DECOMP/DISCECTOMY FUSION  2006  . CARDIAC CATHETERIZATION N/A 04/23/2016   Procedure: Left Heart Cath and Coronary Angiography;  Surgeon: Lyn RecordsHenry W Smith, MD;  [facility];  Service: Cardiovascular;  Laterality: N/A;  . CYSTOSCOPY W/ RETROGRADES Left 01/16/2016   Procedure: CYSTOSCOPY WITH RETROGRADE PYELOGRAM;  Surgeon: Hildred LaserBrian James Budzyn, MD;  Location: [facility] ORS;  Service: Urology;  Laterality: Left;  . CYSTOSCOPY WITH STENT PLACEMENT Left 12/05/2015   Procedure: CYSTOSCOPY WITH STENT PLACEMENT;   Surgeon: Hildred LaserBrian James Budzyn, MD;  Location: [facility] ORS;  Service: Urology;  Laterality: Left;  . CYSTOSCOPY WITH STENT PLACEMENT Left 01/16/2016   Procedure: CYSTOSCOPY WITH STENT PLACEMENT/ EXCHANGE;  Surgeon: Hildred LaserBrian James Budzyn, MD;  Location: [facility] ORS;  Service: Urology;  Laterality: Left;  . CYSTOSCOPY/URETEROSCOPY/HOLMIUM LASER/STENT PLACEMENT Left 10/20/2016   Procedure: CYSTOSCOPY/URETEROSCOPY/HOLMIUM LASER/STENT PLACEMENT;  Surgeon: Vanna ScotlandAshley Brandon, MD;  Location: [facility] ORS;  Service: Urology;  Laterality: Left;  . EXTRACORPOREAL SHOCK WAVE LITHOTRIPSY Left 09/20/2015   Procedure: EXTRACORPOREAL SHOCK WAVE LITHOTRIPSY (ESWL);  Surgeon: Hildred LaserBrian James Budzyn, MD;  Location: [facility] ORS;  Service: Urology;  Laterality: Left;  . EXTRACORPOREAL SHOCK WAVE LITHOTRIPSY Left 11/15/2015   Procedure: EXTRACORPOREAL SHOCK WAVE LITHOTRIPSY (ESWL);  Surgeon: Vanna ScotlandAshley Brandon, MD;  Location: [facility] ORS;  Service: Urology;  Laterality: Left;  . URETEROSCOPY WITH HOLMIUM LASER LITHOTRIPSY Left 12/05/2015   Procedure: URETEROSCOPY WITH HOLMIUM LASER LITHOTRIPSY;  Surgeon: Hildred LaserBrian James Budzyn, MD;  Location: [facility] ORS;  Service: Urology;  Laterality: Left;  . URETEROSCOPY WITH HOLMIUM LASER LITHOTRIPSY Left 01/16/2016   Procedure: URETEROSCOPY WITH HOLMIUM LASER LITHOTRIPSY;  Surgeon: Hildred LaserBrian James Budzyn, MD;  Location: [facility] ORS;  Service: Urology;  Laterality: Left;    Social History  Substance Use Topics  . Smoking status: Never Smoker  . Smokeless tobacco: Never Used  . Alcohol use No     Medication list has been reviewed and updated.   Physical Exam  Constitutional: He is oriented to person, place, and time. He appears well-developed. No distress.  HENT:  Head: Normocephalic and atraumatic.  Neck: No thyromegaly present.  Cardiovascular: Normal rate, regular rhythm and normal heart sounds.   Pulmonary/Chest: Effort normal and breath sounds normal. No respiratory distress. He has no wheezes.  Musculoskeletal:         Left elbow: He exhibits decreased range of motion. He exhibits no swelling and no effusion. Tenderness found. Medial epicondyle tenderness noted.       Right wrist: He exhibits decreased range of motion, tenderness and swelling.  Neurological: He is alert and oriented to person, place, and time.  Skin: Skin is warm and dry. No rash noted.  Psychiatric: He has a normal mood and affect. His behavior is normal. Thought content normal.  Nursing note and vitals reviewed.   BP (!) 146/98   Pulse 76   Temp 97.9 F (36.6 C)   Ht 6' (1.829 m)   Wt 226 lb (102.5 kg)   SpO2 98%   BMI 30.65 kg/m   Assessment and Plan: 1. DM type 2 with diabetic peripheral neuropathy (HCC) Continue current therapy Unable to take cymbalta - Hemoglobin A1c  2. Type 2 diabetes mellitus with stage 3 chronic kidney disease, without long-term current use of insulin (HCC)  3. Chronic midline low back pain, with sciatica presence unspecified Chronic, unchanged Applying for SSD  4. Wrist pain, acute, right - DG Wrist Complete Right; Future  5. Elbow pain, left - DG Elbow Complete Left; Future  6. Essential hypertension  controlled   Bari Edward, MD [facility]  01/05/2017

## 2017-01-06 ENCOUNTER — Ambulatory Visit: Payer: 59 | Admitting: Urology

## 2017-01-06 ENCOUNTER — Encounter: Payer: Self-pay | Admitting: Urology

## 2017-01-06 VITALS — BP 123/77 | HR 80 | Ht 72.0 in | Wt 223.2 lb

## 2017-01-06 DIAGNOSIS — N200 Calculus of kidney: Secondary | ICD-10-CM | POA: Diagnosis not present

## 2017-01-06 LAB — HEMOGLOBIN A1C
Est. average glucose Bld gHb Est-mCnc: 154 mg/dL
HEMOGLOBIN A1C: 7 % — AB (ref 4.8–5.6)

## 2017-01-06 MED ORDER — POTASSIUM CITRATE ER 10 MEQ (1080 MG) PO TBCR: 20.0000 meq | EXTENDED_RELEASE_TABLET | Freq: Three times a day (TID) | ORAL | 11 refills | Status: DC

## 2017-01-06 MED ORDER — POTASSIUM CITRATE ER 10 MEQ (1080 MG) PO TBCR: 20.0000 meq | EXTENDED_RELEASE_TABLET | Freq: Three times a day (TID) | ORAL | Status: DC

## 2017-01-07 ENCOUNTER — Telehealth: Payer: Self-pay

## 2017-01-07 LAB — URIC ACID: URIC ACID: 3.3 mg/dL — AB (ref 3.7–8.6)

## 2017-01-07 NOTE — Telephone Encounter (Signed)
-----   Message from Vanna ScotlandAshley Brandon, MD sent at 01/07/2017  7:26 AM EST ----- Uric acid level is low.  Vanna ScotlandAshley Brandon, MD

## 2017-01-07 NOTE — Telephone Encounter (Signed)
Spoke with pt in reference to lab results. Pt voiced understanding.  

## 2017-01-12 NOTE — Progress Notes (Signed)
01/06/2017 3:34 PM   Brian Butler 1966/04/15 [id number]  Referring provider: Reubin MilanLaura H Berglund, MD [address] [place], [place] [id number]  Chief Complaint  Patient presents with  . Benign Prostatic Hypertrophy    RUS results    HPI: 51 year old male with recurrent nephrolithiasis he returns today for follow-up of 24-hour urine metabolic workup.   Stone history: Underwent ESWL 2 for 2 cm left distal ureteral stone complicated by steinstrasse ultimately underwent left ureteroscopy, laser lithotripsy with Dr. Sherryl BartersBudzyn to clear the residual stone burden. Stent removed on 01/24/2016 and failed to follow up for RUS.    Returned with left lower quadrant pain, ultimately had a CT scan on 10/19/2016 which showed multiple large left distal ureteral calculi measuring up to 1 cm. He returned to the operating room on 10/20/2016 for left ureteroscopy, laser lithotripsy.  The stent was removed thereafter and follow-up renal ultrasound showed significant improvement in his left hydronephrosis from his preop CT but some mild residual fullness.  Today, he denies any flank pain, gross hematuria, or any other symptoms.   Previous stone analysis showed 50% uric acid, 80% calcium oxalate monohydrate, 5% calcium phosphate.  He did undergo 24-hour urine metabolic workup which revealed a low urinary volume of 1.4 L, urinary pH of 5.3, and a borderline elevated urinary calcium of 231 mg per day.  See media for additional details.  Creatinine 1.45 which appears to be around his baseline back in 2014 of 1.36.   PMH: Past Medical History:  Diagnosis Date  . Chest pain    a. 03/2016 Myoview: medium defect of mild severity in basal anteroseptal, mid anteroseptal, and apical septal locations - partially reversible;  b. Cath: nl cors, EF 55-65%. EDP 16 mmHg.  Marland Kitchen. Chronic back pain   . CKD (chronic kidney disease), stage III    [name]/notes 04/21/2016  . FH: stroke 09/05/2015  . High cholesterol   . Hx of  hypotestosteronemia 09/05/2015  . Hyperglycemia 09/05/2015  . Hypertension 09/05/2015  . Nephrolithiasis "several times"  . Plantar fasciitis, bilateral   . Uncontrolled type II diabetes mellitus [facility](HCC)     Surgical History: Past Surgical History:  Procedure Laterality Date  . ANTERIOR CERVICAL DECOMP/DISCECTOMY FUSION  2006  . CARDIAC CATHETERIZATION N/A 04/23/2016   Procedure: Left Heart Cath and Coronary Angiography;  Surgeon: Lyn RecordsHenry W Smith, MD;  [facility];  Service: Cardiovascular;  Laterality: N/A;  . CYSTOSCOPY W/ RETROGRADES Left 01/16/2016   Procedure: CYSTOSCOPY WITH RETROGRADE PYELOGRAM;  Surgeon: Hildred LaserBrian James Budzyn, MD;  Location: [facility] ORS;  Service: Urology;  Laterality: Left;  . CYSTOSCOPY WITH STENT PLACEMENT Left 12/05/2015   Procedure: CYSTOSCOPY WITH STENT PLACEMENT;  Surgeon: Hildred LaserBrian James Budzyn, MD;  Location: [facility] ORS;  Service: Urology;  Laterality: Left;  . CYSTOSCOPY WITH STENT PLACEMENT Left 01/16/2016   Procedure: CYSTOSCOPY WITH STENT PLACEMENT/ EXCHANGE;  Surgeon: Hildred LaserBrian James Budzyn, MD;  Location: [facility] OR[facility];  Service: Urology;  Laterality: Left;  . CYSTOSCOPY/URETEROSCOPY/HOLMIUM LASER/STENT PLACEMENT Left 10/20/2016   Procedure: CYSTOSCOPY/URETEROSCOPY/HOLMIUM LASER/STENT PLACEMENT;  Surgeon: Vanna ScotlandAshley Shatika Grinnell, MD;  Location: [facility] ORS;  Service: Urology;  Laterality: Left;  . EXTRACORPOREAL SHOCK WAVE LITHOTRIPSY Left 09/20/2015   Procedure: EXTRACORPOREAL SHOCK WAVE LITHOTRIPSY (ESWL);  Surgeon: Hildred LaserBrian James Budzyn, MD;  Location: [facility] OR[facility];  Service: Urology;  Laterality: Left;  . EXTRACORPOREAL SHOCK WAVE LITHOTRIPSY Left 11/15/2015   Procedure: EXTRACORPOREAL SHOCK WAVE LITHOTRIPSY (ESWL);  Surgeon: Vanna ScotlandAshley Emanuele Mcwhirter, MD;  Location: [facility];  Service: Urology;  Laterality: Left;  . URETEROSCOPY WITH HOLMIUM LASER LITHOTRIPSY Left 12/05/2015   Procedure: URETEROSCOPY WITH HOLMIUM LASER LITHOTRIPSY;  Surgeon: Hildred Laser, MD;  Location: [facility] OR[facility];   Service: Urology;  Laterality: Left;  . URETEROSCOPY WITH HOLMIUM LASER LITHOTRIPSY Left 01/16/2016   Procedure: URETEROSCOPY WITH HOLMIUM LASER LITHOTRIPSY;  Surgeon: Hildred Laser, MD;  Location: [facility] OR[facility];  Service: Urology;  Laterality: Left;    Home Medications:  Allergies as of 01/06/2017      Reactions   Cymbalta [duloxetine Hcl] Other (See Comments)   insomnia      Medication List       Accurate as of 01/06/17 11:59 PM. Always use your most recent med list.          aspirin 325 MG EC tablet Take 1 tablet (325 mg total) by mouth daily.   atorvastatin 10 MG tablet Commonly known as:  LIPITOR Take 1 tablet (10 mg total) by mouth daily at 6 PM.   docusate sodium 100 MG capsule Commonly known as:  COLACE Take 1 capsule (100 mg total) by mouth 2 (two) times daily.   gabapentin 300 MG capsule Commonly known as:  NEURONTIN Take 1 capsule (300 mg total) by mouth 2 (two) times daily. And take 2 at bedtime   lisinopril 10 MG tablet Commonly known as:  PRINIVIL,ZESTRIL Take 1 tablet (10 mg total) by mouth daily.   meloxicam 15 MG tablet Commonly known as:  MOBIC Take 1 tablet (15 mg total) by mouth daily.   metFORMIN 500 MG tablet Commonly known as:  GLUCOPHAGE Take 1 tablet (500 mg total) by mouth 2 (two) times daily with a meal.   oxybutynin 5 MG tablet Commonly known as:  DITROPAN Take 1 tablet (5 mg total) by mouth every 8 (eight) hours as needed for bladder spasms.   potassium citrate 10 MEQ (1080 MG) SR tablet Commonly known as:  UROCIT-K Take 2 tablets (20 mEq total) by mouth 3 (three) times daily with meals.       Allergies:  Allergies  Allergen Reactions  . Cymbalta [Duloxetine Hcl] Other (See Comments)    insomnia    Family History: Family History  Problem Relation Age of Onset  . Stroke Father   . Nephrolithiasis Father   . Prostate cancer Neg Hx   . Bladder Cancer Neg Hx     Social History:  reports that he has never smoked. He has  never used smokeless tobacco. He reports that he does not drink alcohol or use drugs.  ROS: UROLOGY Frequent Urination?: No Hard to postpone urination?: No Burning/pain with urination?: No Get up at night to urinate?: No Leakage of urine?: No Urine stream starts and stops?: No Trouble starting stream?: No Do you have to strain to urinate?: No Blood in urine?: No Urinary tract infection?: No Sexually transmitted disease?: No Injury to kidneys or bladder?: No Painful intercourse?: No Weak stream?: No Erection problems?: No Penile pain?: No  Gastrointestinal Nausea?: No Vomiting?: No Indigestion/heartburn?: No Diarrhea?: No Constipation?: No  Constitutional Fever: No Night sweats?: No Weight loss?: No Fatigue?: No  Skin Skin rash/lesions?: No Itching?: No  Eyes Blurred vision?: No Double vision?: No  Ears/Nose/Throat Sore throat?: No Sinus problems?: No  Hematologic/Lymphatic Swollen glands?: No Easy bruising?: No  Cardiovascular Leg swelling?: No Chest pain?: No  Respiratory Cough?: No Shortness of breath?: No  Endocrine Excessive thirst?: No  Musculoskeletal Back pain?: No Joint pain?: No  Neurological Headaches?: No Dizziness?: No  Psychologic  Depression?: No Anxiety?: No  Physical Exam: BP 123/77 (BP Location: Left Arm, Patient Position: Sitting, Cuff Size: Normal)   Pulse 80   Ht 6' (1.829 m)   Wt 223 lb 3.2 oz (101.2 kg)   BMI 30.27 kg/m   Constitutional:  Alert and oriented, No acute distress. HEENT: [place] AT, moist mucus membranes.  Trachea midline, no masses. Cardiovascular: No clubbing, cyanosis, or edema. Respiratory: Normal respiratory effort, no increased work of breathing. GI: Abdomen is soft, nondistended, no abdominal masses.   GU: No CVA tenderness.   Skin: No rashes, bruises or suspicious lesions. Neurologic: Grossly intact, no focal deficits, moving all 4 extremities. Psychiatric: Normal mood and  affect.  Laboratory Data: Lab Results  Component Value Date   WBC 7.8 10/19/2016   HGB 14.4 10/19/2016   HCT 40.6 10/19/2016   MCV 87.5 10/19/2016   PLT 188 10/19/2016    Lab Results  Component Value Date   CREATININE 1.52 (H) 10/19/2016     Lab Results  Component Value Date   TESTOSTERONE 244 (L) 09/05/2015    Lab Results  Component Value Date   HGBA1C 7.0 (H) 01/05/2017    Pertinent Imaging: CLINICAL DATA:  Nephrolithiasis.  Prior left ureteroscopy .  EXAM: RENAL / URINARY TRACT ULTRASOUND COMPLETE  COMPARISON:  10/19/2016 .  FINDINGS: Right Kidney:  Length: 11.1 cm. Echogenicity within normal limits. No mass or hydronephrosis visualized.  Left Kidney:  Length: 10.7 cm. Echogenicity within normal limits. No mass. Mild residual left hydronephrosis. Left hydronephrosis has improved significantly from 10/19/2016 .  Bladder:  Appears normal for degree of bladder distention. Bilateral ureteral jets noted.  IMPRESSION: Mild residual left hydronephrosis. Hydronephrosis has improved significantly from prior CT of 10/19/2016. No acute abnormality identified.   Electronically Signed   By: Maisie Fus  Register   On: 12/29/2016 10:55  Renal ultrasound per survey today.  Assessment & Plan:    1. Nephrolithiasis History of recurrent nephrolithiasis and obstructing left ureteral stones status post multiple procedures Mild residual left hydronephrosis, dramatically improved from preop which is reassuring- Will repeat renal ultrasound in one month 24 urine metabolic workup reviewed in detail today- recommendations include increasing urinary volume to 2.5 L as well as urinary alkalalinization Prescribed potassium citrate, 20 mg 3 times a day and recheck BMP in 2 weeks Uric acid level today to r/o hyperuricemia Will likely repeat Litholink on therapy to assess efficacy Additional stone diet recs discuss, info re: uric acid stones reviewed - Urinalysis,  Complete - Abdomen 1 view (KUB); Future - US Renal; Future   Return in about 3 months (around 04/05/2017) for recheck stones (lab visit in 2 weeks for BMP).  Vanna Scotland, MD  [facility] [address] [place], [place] [address] [phone]  I spent 25 min with this patient of which greater than 50% was spent in counseling and coordination of care with the patient.

## 2017-01-20 ENCOUNTER — Other Ambulatory Visit: Payer: 59

## 2017-01-23 ENCOUNTER — Other Ambulatory Visit: Payer: 59

## 2017-01-23 ENCOUNTER — Ambulatory Visit: Payer: 59 | Admitting: Family Medicine

## 2017-01-23 DIAGNOSIS — E7800 Pure hypercholesterolemia, unspecified: Secondary | ICD-10-CM | POA: Diagnosis not present

## 2017-01-23 DIAGNOSIS — I10 Essential (primary) hypertension: Secondary | ICD-10-CM | POA: Diagnosis not present

## 2017-01-23 DIAGNOSIS — N200 Calculus of kidney: Secondary | ICD-10-CM | POA: Diagnosis not present

## 2017-01-23 DIAGNOSIS — H524 Presbyopia: Secondary | ICD-10-CM | POA: Diagnosis not present

## 2017-01-23 DIAGNOSIS — E119 Type 2 diabetes mellitus without complications: Secondary | ICD-10-CM | POA: Diagnosis not present

## 2017-01-24 LAB — BASIC METABOLIC PANEL
BUN/Creatinine Ratio: 11 (ref 9–20)
BUN: 15 mg/dL (ref 6–24)
CALCIUM: 9.3 mg/dL (ref 8.7–10.2)
CO2: 26 mmol/L (ref 18–29)
CREATININE: 1.35 mg/dL — AB (ref 0.76–1.27)
Chloride: 98 mmol/L (ref 96–106)
GFR calc Af Amer: 70 mL/min/{1.73_m2} (ref >59)
GFR, EST NON AFRICAN AMERICAN: 61 mL/min/{1.73_m2} (ref >59)
Glucose: 214 mg/dL — ABNORMAL HIGH (ref 65–99)
Potassium: 4.8 mmol/L (ref 3.5–5.2)
Sodium: 140 mmol/L (ref 134–144)

## 2017-01-26 ENCOUNTER — Telehealth: Payer: Self-pay

## 2017-01-26 NOTE — Telephone Encounter (Signed)
-----   Message from Vanna ScotlandAshley Brandon, MD sent at 01/26/2017  7:42 AM EST ----- Potassium looks good.    Vanna ScotlandAshley Brandon, MD

## 2017-01-26 NOTE — Telephone Encounter (Signed)
Spoke with pt in reference to lab results. Pt voiced understanding.  

## 2017-02-17 ENCOUNTER — Ambulatory Visit
Admission: RE | Admit: 2017-02-17 | Discharge: 2017-02-17 | Disposition: A | Discharge status: Home / Self Care | Payer: 59 | Source: Ambulatory Visit | Attending: Urology | Admitting: Urology

## 2017-02-17 DIAGNOSIS — N1330 Unspecified hydronephrosis: Secondary | ICD-10-CM | POA: Diagnosis not present

## 2017-02-17 DIAGNOSIS — N200 Calculus of kidney: Secondary | ICD-10-CM | POA: Diagnosis not present

## 2017-02-19 ENCOUNTER — Ambulatory Visit: Payer: 59 | Admitting: Urology

## 2017-02-19 ENCOUNTER — Encounter: Payer: Self-pay | Admitting: Urology

## 2017-02-19 VITALS — BP 125/81 | HR 83 | Ht 72.0 in | Wt 223.0 lb

## 2017-02-19 DIAGNOSIS — N200 Calculus of kidney: Secondary | ICD-10-CM

## 2017-02-19 DIAGNOSIS — N182 Chronic kidney disease, stage 2 (mild): Secondary | ICD-10-CM

## 2017-02-19 DIAGNOSIS — N261 Atrophy of kidney (terminal): Secondary | ICD-10-CM

## 2017-02-19 NOTE — Progress Notes (Signed)
02/19/2017 8:09 AM   Brian Butler 08-12-1966 [id number]  Referring provider: Reubin Milan, MD [address] [place], [place] [address]  Chief Complaint  Patient presents with  . Flank Pain    HPI: 51 year old male with recurrent nephrolithiasis who return today to follow-up renal ultrasound.   Stone history: Underwent ESWL 2 for 2 cm left distal ureteral stone complicated by steinstrasse ultimately underwent left ureteroscopy, laser lithotripsy with Dr. Sherryl Barters to clear the residual stone burden. Stent removed on 01/24/2016 and failed to follow up for RUS.    Returned with left lower quadrant pain, ultimately had a CT scan on 10/19/2016 which showed multiple large left distal ureteral calculi measuring up to 1 cm. He returned to the operating room on 10/20/2016 for left ureteroscopy, laser lithotripsy.  The stent was removed thereafter and follow-up renal ultrasound showed significant improvement in his left hydronephrosis from his preop CT but some mild residual fullness.  Follow-up renal ultrasound on 02/17/2017 showed complete resolution of hydronephrosis.  There are no other stones identified within the collecting system bilaterally. There is left renal atrophy appreciated.  He does report today that he had an episode of left flank pain last week which lasted a few days and then resolved. He did not see a stone pass.  No gross hematuria or dysuria. No fevers or chills. He was concerned about a stone episode.  Previous stone analysis showed 50% uric acid, 80% calcium oxalate monohydrate, 5% calcium phosphate.  He did undergo 24-hour urine metabolic workup which revealed a low urinary volume of 1.4 L, urinary pH of 5.3, and a borderline elevated urinary calcium of 231 mg per day.    He was started on 20 mEq of potassium citrate 3 times a day last visit.  Creatinine 1.45 which appears to be around his baseline back in 2014 of 1.36.   PMH: Past Medical History:    Diagnosis Date  . Chest pain    a. 03/2016 Myoview: medium defect of mild severity in basal anteroseptal, mid anteroseptal, and apical septal locations - partially reversible;  b. Cath: nl cors, EF 55-65%. EDP 16 mmHg.  Marland Kitchen Chronic back pain   . CKD (chronic kidney disease), stage III    [name] 04/21/2016  . FH: stroke 09/05/2015  . High cholesterol   . Hx of hypotestosteronemia 09/05/2015  . Hyperglycemia 09/05/2015  . Hypertension 09/05/2015  . Nephrolithiasis "several times"  . Plantar fasciitis, bilateral   . Uncontrolled type II diabetes mellitus [facility])     Surgical History: Past Surgical History:  Procedure Laterality Date  . ANTERIOR CERVICAL DECOMP/DISCECTOMY FUSION  2006  . CARDIAC CATHETERIZATION N/A 04/23/2016   Procedure: Left Heart Cath and Coronary Angiography;  Surgeon: Lyn Records, MD;  Location: [facility] INVASIVE CV LAB;  Service: Cardiovascular;  Laterality: N/A;  . CYSTOSCOPY W/ RETROGRADES Left 01/16/2016   Procedure: CYSTOSCOPY WITH RETROGRADE PYELOGRAM;  Surgeon: Hildred Laser, MD;  Location: [facility] OR[facility];  Service: Urology;  Laterality: Left;  . CYSTOSCOPY WITH STENT PLACEMENT Left 12/05/2015   Procedure: CYSTOSCOPY WITH STENT PLACEMENT;  Surgeon: Hildred Laser, MD;  Location: [facility] OR[facility];  Service: Urology;  Laterality: Left;  . CYSTOSCOPY WITH STENT PLACEMENT Left 01/16/2016   Procedure: CYSTOSCOPY WITH STENT PLACEMENT/ EXCHANGE;  Surgeon: Hildred Laser, MD;  Location: [facility] OR[facility];  Service: Urology;  Laterality: Left;  . CYSTOSCOPY/URETEROSCOPY/HOLMIUM LASER/STENT PLACEMENT Left 10/20/2016   Procedure: CYSTOSCOPY/URETEROSCOPY/HOLMIUM LASER/STENT PLACEMENT;  Surgeon: Vanna Scotland, MD;  Location: [facility] OR[facility];  Service: Urology;  Laterality: Left;  . EXTRACORPOREAL SHOCK WAVE LITHOTRIPSY Left 09/20/2015   Procedure: EXTRACORPOREAL SHOCK WAVE LITHOTRIPSY (ESWL);  Surgeon: Hildred LaserBrian James Budzyn, MD;  Location: [facility];  Service: Urology;  Laterality: Left;  . EXTRACORPOREAL  SHOCK WAVE LITHOTRIPSY Left 11/15/2015   Procedure: EXTRACORPOREAL SHOCK WAVE LITHOTRIPSY (ESWL);  Surgeon: Vanna ScotlandAshley Anubis Fundora, MD;  Location: [facility] ORS;  Service: Urology;  Laterality: Left;  . URETEROSCOPY WITH HOLMIUM LASER LITHOTRIPSY Left 12/05/2015   Procedure: URETEROSCOPY WITH HOLMIUM LASER LITHOTRIPSY;  Surgeon: Hildred LaserBrian James Budzyn, MD;  Location: [facility];  Service: Urology;  Laterality: Left;  . URETEROSCOPY WITH HOLMIUM LASER LITHOTRIPSY Left 01/16/2016   Procedure: URETEROSCOPY WITH HOLMIUM LASER LITHOTRIPSY;  Surgeon: Hildred LaserBrian James Budzyn, MD;  Location: [facility] ORS;  Service: Urology;  Laterality: Left;    Home Medications:  Allergies as of 02/19/2017      Reactions   Cymbalta [duloxetine Hcl] Other (See Comments)   insomnia      Medication List       Accurate as of 02/19/17 11:59 PM. Always use your most recent med list.          aspirin 325 MG EC tablet Take 1 tablet (325 mg total) by mouth daily.   atorvastatin 10 MG tablet Commonly known as:  LIPITOR Take 1 tablet (10 mg total) by mouth daily at 6 PM.   docusate sodium 100 MG capsule Commonly known as:  COLACE Take 1 capsule (100 mg total) by mouth 2 (two) times daily.   gabapentin 300 MG capsule Commonly known as:  NEURONTIN Take 1 capsule (300 mg total) by mouth 2 (two) times daily. And take 2 at bedtime   lisinopril 10 MG tablet Commonly known as:  PRINIVIL,ZESTRIL Take 1 tablet (10 mg total) by mouth daily.   meloxicam 15 MG tablet Commonly known as:  MOBIC Take 1 tablet (15 mg total) by mouth daily.   metFORMIN 500 MG tablet Commonly known as:  GLUCOPHAGE Take 1 tablet (500 mg total) by mouth 2 (two) times daily with a meal.   oxybutynin 5 MG tablet Commonly known as:  DITROPAN Take 1 tablet (5 mg total) by mouth every 8 (eight) hours as needed for bladder spasms.   potassium citrate 10 MEQ (1080 MG) SR tablet Commonly known as:  UROCIT-K Take 2 tablets (20 mEq total) by mouth 3 (three) times daily with  meals.       Allergies:  Allergies  Allergen Reactions  . Cymbalta [Duloxetine Hcl] Other (See Comments)    insomnia    Family History: Family History  Problem Relation Age of Onset  . Stroke Father   . Nephrolithiasis Father   . Prostate cancer Neg Hx   . Bladder Cancer Neg Hx     Social History:  reports that he has never smoked. He has never used smokeless tobacco. He reports that he does not drink alcohol or use drugs.  ROS: UROLOGY Frequent Urination?: No Hard to postpone urination?: No Burning/pain with urination?: No Get up at night to urinate?: Yes Leakage of urine?: No Urine stream starts and stops?: No Trouble starting stream?: No Do you have to strain to urinate?: No Blood in urine?: No Urinary tract infection?: No Sexually transmitted disease?: No Injury to kidneys or bladder?: No Painful intercourse?: No Weak stream?: No Erection problems?: No Penile pain?: No  Gastrointestinal Nausea?: No Vomiting?: No Indigestion/heartburn?: No Diarrhea?: No Constipation?: No  Constitutional Fever: No Night sweats?: No Weight loss?: No Fatigue?: No  Skin  Skin rash/lesions?: No Itching?: No  Eyes Blurred vision?: No Double vision?: No  Ears/Nose/Throat Sore throat?: No Sinus problems?: No  Hematologic/Lymphatic Swollen glands?: No Easy bruising?: No  Cardiovascular Leg swelling?: No Chest pain?: No  Respiratory Cough?: No Shortness of breath?: No  Endocrine Excessive thirst?: No  Musculoskeletal Back pain?: Yes Joint pain?: Yes  Neurological Headaches?: No Dizziness?: No  Psychologic Depression?: No Anxiety?: No  Physical Exam: BP 125/81   Pulse 83   Ht 6' (1.829 m)   Wt 223 lb (101.2 kg)   BMI 30.24 kg/m   Constitutional:  Alert and oriented, No acute distress. HEENT: [place] AT, moist mucus membranes.  Trachea midline, no masses. Cardiovascular: No clubbing, cyanosis, or edema. Respiratory: Normal respiratory effort, no  increased work of breathing. GI: Abdomen is soft, nondistended, no abdominal masses.   GU: No CVA tenderness.   Skin: No rashes, bruises or suspicious lesions. Neurologic: Grossly intact, no focal deficits, moving all 4 extremities. Psychiatric: Normal mood and affect.  Laboratory Data: Lab Results  Component Value Date   WBC 7.8 10/19/2016   HGB 14.4 10/19/2016   HCT 40.6 10/19/2016   MCV 87.5 10/19/2016   PLT 188 10/19/2016    Lab Results  Component Value Date   CREATININE 1.35 (H) 01/23/2017     Lab Results  Component Value Date   TESTOSTERONE 244 (L) 09/05/2015    Lab Results  Component Value Date   HGBA1C 7.0 (H) 01/05/2017    Pertinent Imaging: CLINICAL DATA:  Normal kidney stones. Left hydronephrosis follow-up.  EXAM: RENAL / URINARY TRACT ULTRASOUND COMPLETE  COMPARISON:  12/29/2016  FINDINGS: Right Kidney:  Length: 11.0 cm. Echogenicity within normal limits. No mass or hydronephrosis visualized.  Left Kidney:  Length: 10.9 cm. Diffuse cortical thinning, most pronounced in the left upper pole. No hydronephrosis.  Bladder:  Appears normal for degree of bladder distention.  IMPRESSION: Cortical thinning throughout throughout the left kidney. No hydronephrosis currently.   Electronically Signed   By: Charlett Nose M.D.   On: 02/17/2017 09:08   Renal ultrasound personally reviewed today.  Assessment & Plan:    1. Recurrent nephrolithiasis Continue potassiums citrate 20 mEq 3 times a day Recommend repeat 24-hour urine metabolic workup to ensure that there is improvement in his urinary electrolytes- will call with results Advised to call or return ASAP if develops recurrent left flank pain  2. Left renal atrophy Discussed presence of left-sided cortical atrophy presumably related to recurrent stone episodes on this time Increased risk for chronic kidney disease with progression, offered referral to nephrology but  declined Reviewed importance of close blood pressure and diabetes control as well as early intervention for any obstructing stones Hydration   3. CKD (chronic kidney disease) stage 2, GFR 60-89 ml/min As above   Follow up in May as scheduled  Vanna Scotland, MD  [facility] [address], [address] [place], [place] [address] [phone]  I spent 15 min with this patient of which greater than 50% was spent in counseling and coordination of care with the patient.

## 2017-02-23 DIAGNOSIS — Z7689 Persons encountering health services in other specified circumstances: Secondary | ICD-10-CM | POA: Diagnosis not present

## 2017-02-23 DIAGNOSIS — M722 Plantar fascial fibromatosis: Secondary | ICD-10-CM | POA: Diagnosis not present

## 2017-02-23 DIAGNOSIS — E1140 Type 2 diabetes mellitus with diabetic neuropathy, unspecified: Secondary | ICD-10-CM | POA: Diagnosis not present

## 2017-02-23 DIAGNOSIS — M542 Cervicalgia: Secondary | ICD-10-CM | POA: Diagnosis not present

## 2017-02-24 ENCOUNTER — Other Ambulatory Visit: Payer: Self-pay | Admitting: Internal Medicine

## 2017-02-24 DIAGNOSIS — G8929 Other chronic pain: Secondary | ICD-10-CM

## 2017-02-24 DIAGNOSIS — M5442 Lumbago with sciatica, left side: Principal | ICD-10-CM

## 2017-02-25 ENCOUNTER — Other Ambulatory Visit: Payer: Self-pay | Admitting: Internal Medicine

## 2017-02-25 DIAGNOSIS — M542 Cervicalgia: Principal | ICD-10-CM

## 2017-02-25 DIAGNOSIS — G8929 Other chronic pain: Secondary | ICD-10-CM

## 2017-02-25 DIAGNOSIS — M5442 Lumbago with sciatica, left side: Secondary | ICD-10-CM

## 2017-03-03 DIAGNOSIS — H6061 Unspecified chronic otitis externa, right ear: Secondary | ICD-10-CM | POA: Diagnosis not present

## 2017-03-03 DIAGNOSIS — H6121 Impacted cerumen, right ear: Secondary | ICD-10-CM | POA: Diagnosis not present

## 2017-03-09 ENCOUNTER — Other Ambulatory Visit: Payer: 59

## 2017-03-09 ENCOUNTER — Other Ambulatory Visit: Payer: Self-pay | Admitting: Family Medicine

## 2017-03-09 DIAGNOSIS — E119 Type 2 diabetes mellitus without complications: Secondary | ICD-10-CM

## 2017-03-10 ENCOUNTER — Other Ambulatory Visit: Payer: 59

## 2017-03-10 DIAGNOSIS — N200 Calculus of kidney: Secondary | ICD-10-CM | POA: Diagnosis not present

## 2017-03-13 ENCOUNTER — Ambulatory Visit: Payer: 59

## 2017-03-13 ENCOUNTER — Ambulatory Visit
Admission: RE | Admit: 2017-03-13 | Discharge: 2017-03-13 | Disposition: A | Discharge status: Home / Self Care | Payer: 59 | Source: Ambulatory Visit | Attending: Internal Medicine | Admitting: Internal Medicine

## 2017-03-17 ENCOUNTER — Other Ambulatory Visit: Payer: Self-pay | Admitting: Urology

## 2017-03-17 ENCOUNTER — Ambulatory Visit: Payer: 59 | Admitting: Dietician

## 2017-03-18 ENCOUNTER — Encounter: Payer: Self-pay | Admitting: Endocrinology

## 2017-03-18 ENCOUNTER — Ambulatory Visit (INDEPENDENT_AMBULATORY_CARE_PROVIDER_SITE_OTHER): Payer: 59 | Admitting: Endocrinology

## 2017-03-18 VITALS — BP 126/74 | HR 65 | Ht 72.0 in | Wt 214.0 lb

## 2017-03-18 DIAGNOSIS — N183 Chronic kidney disease, stage 3 (moderate): Secondary | ICD-10-CM | POA: Diagnosis not present

## 2017-03-18 DIAGNOSIS — E1122 Type 2 diabetes mellitus with diabetic chronic kidney disease: Secondary | ICD-10-CM | POA: Diagnosis not present

## 2017-03-18 LAB — POCT GLYCOSYLATED HEMOGLOBIN (HGB A1C): HEMOGLOBIN A1C: 6.7

## 2017-03-18 MED ORDER — LINAGLIPTIN 5 MG PO TABS: 5.0000 mg | ORAL_TABLET | Freq: Every day | ORAL | 11 refills | Status: DC

## 2017-03-18 NOTE — Progress Notes (Signed)
Subjective:    Patient ID: Brian Butler, male    DOB: 12-29-1965, 51 y.o.   MRN: [id number]  HPI pt is referred by Dr Marcello Fennel, for diabetes.  Pt states DM was dx'ed in 2016; he has severe neuropathy of the lower extremities (attributes to spinal problems); he has associated renal insufficiency; he has never been on insulin; pt says his diet is good, but exercise is limited by health problems; he has never had pancreatitis, pancreatic surgery, severe hypoglycemia or DKA.  He takes metformin only.   Past Medical History:  Diagnosis Date  . Chest pain    a. 03/2016 Myoview: medium defect of mild severity in basal anteroseptal, mid anteroseptal, and apical septal locations - partially reversible;  b. Cath: nl cors, EF 55-65%. EDP 16 mmHg.  Marland Kitchen Chronic back pain   . CKD (chronic kidney disease), stage III    [name] 04/21/2016  . FH: stroke 09/05/2015  . High cholesterol   . Hx of hypotestosteronemia 09/05/2015  . Hyperglycemia 09/05/2015  . Hypertension 09/05/2015  . Nephrolithiasis "several times"  . Plantar fasciitis, bilateral   . Uncontrolled type II diabetes mellitus (HCC)     Past Surgical History:  Procedure Laterality Date  . ANTERIOR CERVICAL DECOMP/DISCECTOMY FUSION  2006  . CARDIAC CATHETERIZATION N/A 04/23/2016   Procedure: Left Heart Cath and Coronary Angiography;  Surgeon: Lyn Records, MD;  Location: [facility] INVASIVE CV LAB;  Service: Cardiovascular;  Laterality: N/A;  . CYSTOSCOPY W/ RETROGRADES Left 01/16/2016   Procedure: CYSTOSCOPY WITH RETROGRADE PYELOGRAM;  Surgeon: Hildred Laser, MD;  Location: [facility] ORS;  Service: Urology;  Laterality: Left;  . CYSTOSCOPY WITH STENT PLACEMENT Left 12/05/2015   Procedure: CYSTOSCOPY WITH STENT PLACEMENT;  Surgeon: Hildred Laser, MD;  Location: [facility];  Service: Urology;  Laterality: Left;  . CYSTOSCOPY WITH STENT PLACEMENT Left 01/16/2016   Procedure: CYSTOSCOPY WITH STENT PLACEMENT/ EXCHANGE;  Surgeon: Hildred Laser, MD;  Location:  [facility];  Service: Urology;  Laterality: Left;  . CYSTOSCOPY/URETEROSCOPY/HOLMIUM LASER/STENT PLACEMENT Left 10/20/2016   Procedure: CYSTOSCOPY/URETEROSCOPY/HOLMIUM LASER/STENT PLACEMENT;  Surgeon: Vanna Scotland, MD;  Location: [facility];  Service: Urology;  Laterality: Left;  . EXTRACORPOREAL SHOCK WAVE LITHOTRIPSY Left 09/20/2015   Procedure: EXTRACORPOREAL SHOCK WAVE LITHOTRIPSY (ESWL);  Surgeon: Hildred Laser, MD;  Location: [facility];  Service: Urology;  Laterality: Left;  . EXTRACORPOREAL SHOCK WAVE LITHOTRIPSY Left 11/15/2015   Procedure: EXTRACORPOREAL SHOCK WAVE LITHOTRIPSY (ESWL);  Surgeon: Vanna Scotland, MD;  Location: [facility];  Service: Urology;  Laterality: Left;  . URETEROSCOPY WITH HOLMIUM LASER LITHOTRIPSY Left 12/05/2015   Procedure: URETEROSCOPY WITH HOLMIUM LASER LITHOTRIPSY;  Surgeon: Hildred Laser, MD;  Location: [facility];  Service: Urology;  Laterality: Left;  . URETEROSCOPY WITH HOLMIUM LASER LITHOTRIPSY Left 01/16/2016   Procedure: URETEROSCOPY WITH HOLMIUM LASER LITHOTRIPSY;  Surgeon: Hildred Laser, MD;  Location: [facility];  Service: Urology;  Laterality: Left;    Social History   Social History  . Marital status: Married    Spouse name: N/A  . Number of children: N/A  . Years of education: N/A   Occupational History  . Not on file.   Social History Main Topics  . Smoking status: Never Smoker  . Smokeless tobacco: Never Used  . Alcohol use No  . Drug use: No  . Sexual activity: Not Currently   Other Topics Concern  . Not on file   Social History Narrative  . No narrative on file  Current Outpatient Prescriptions on File Prior to Visit  Medication Sig Dispense Refill  . aspirin 325 MG EC tablet Take 1 tablet (325 mg total) by mouth daily. 30 tablet 0  . atorvastatin (LIPITOR) 10 MG tablet Take 1 tablet (10 mg total) by mouth daily at 6 PM. 90 tablet 3  . gabapentin (NEURONTIN) 300 MG capsule Take 1 capsule (300 mg total) by mouth 2  (two) times daily. And take 2 at bedtime 360 capsule 1  . lisinopril (PRINIVIL,ZESTRIL) 10 MG tablet Take 1 tablet (10 mg total) by mouth daily. 90 tablet 3  . meloxicam (MOBIC) 15 MG tablet Take 1 tablet (15 mg total) by mouth daily. 90 tablet 0  . oxybutynin (DITROPAN) 5 MG tablet Take 1 tablet (5 mg total) by mouth every 8 (eight) hours as needed for bladder spasms. 30 tablet 0  . potassium citrate (UROCIT-K) 10 MEQ (1080 MG) SR tablet Take 2 tablets (20 mEq total) by mouth 3 (three) times daily with meals. 90 tablet 11  . docusate sodium (COLACE) 100 MG capsule Take 1 capsule (100 mg total) by mouth 2 (two) times daily. (Patient not taking: Reported on 03/18/2017) 60 capsule 0   No current facility-administered medications on file prior to visit.     Allergies  Allergen Reactions  . Cymbalta [Duloxetine Hcl] Other (See Comments)    insomnia    Family History  Problem Relation Age of Onset  . Stroke Father   . Nephrolithiasis Father   . Diabetes type I Maternal Grandfather   . Prostate cancer Neg Hx   . Bladder Cancer Neg Hx     BP 126/74   Pulse 65   Ht 6' (1.829 m)   Wt 214 lb (97.1 kg)   SpO2 97%   BMI 29.02 kg/m     Review of Systems denies weight loss, headache, chest pain, sob, n/v, urinary frequency, excessive diaphoresis, depression, cold intolerance, rhinorrhea, and easy bruising.  He has chronic low back pain, and chronic visual loss.      Objective:   Physical Exam VS: see vs page GEN: no distress HEAD: head: no deformity eyes: no periorbital swelling, no proptosis external nose and ears are normal mouth: no lesion seen NECK: Neck: a healed scar is present (C-spine procedure).  I do not appreciate a nodule in the thyroid or elsewhere in the neck CHEST WALL: no deformity LUNGS: clear to auscultation CV: reg rate and rhythm, no murmur ABD: abdomen is soft, nontender.  no hepatosplenomegaly.  not distended.  no hernia MUSCULOSKELETAL: muscle bulk and  strength are grossly normal.  no obvious joint swelling.  gait is normal and steady EXTEMITIES: no deformity.  no ulcer on the feet.  feet are of normal color and temp.  Trace bilat leg edema PULSES: dorsalis pedis intact bilat.  no carotid bruit NEURO:  cn 2-12 grossly intact.   readily moves all 4's.  sensation is intact to touch on the feet, but decreased from normal.  SKIN:  Normal texture and temperature.  No rash or suspicious lesion is visible.   NODES:  None palpable at the neck.   PSYCH: alert, well-oriented.  Does not appear anxious nor depressed.   Lab Results  Component Value Date   CREATININE 1.35 (H) 01/23/2017   BUN 15 01/23/2017   NA 140 01/23/2017   K 4.8 01/23/2017   CL 98 01/23/2017   CO2 26 01/23/2017    Lab Results  Component Value Date   HGBA1C 6.7  03/18/2017   I personally reviewed electrocardiogram tracing (04/21/16): Indication: chest pain Impression: ST.  LAE.  No MI.  No hypertrophy. Compared to Jan, 2017: ST is new.     Assessment & Plan:  Type 2 DM: new to me.  well-controlled Renal insuff: d/c metformin.   Edema: this limits oral rx options.    Patient Instructions  good diet and exercise significantly improve the control of your diabetes.  please let me know if you wish to be referred to a dietician.  high blood sugar is very risky to your health.  you should see an eye doctor and dentist every year.  It is very important to get all recommended vaccinations.  Controlling your blood pressure and cholesterol drastically reduces the damage diabetes does to your body.  Those who smoke should quit.  Please discuss these with your doctor.  check your blood sugar twice a week.  vary the time of day when you check, between before the 3 meals, and at bedtime.  also check if you have symptoms of your blood sugar being too high or too low.  please keep a record of the readings and bring it to your next appointment here (or you can bring the meter itself).  You  can write it on any piece of paper.  please call us sooner if your blood sugar goes below 70, or if you have a lot of readings over 200.   I have sent a prescription to your pharmacy, to change metformin to "tradjenta." Please come back for a follow-up appointment in 3 months

## 2017-03-18 NOTE — Patient Instructions (Addendum)
good diet and exercise significantly improve the control of your diabetes.  please let me know if you wish to be referred to a dietician.  high blood sugar is very risky to your health.  you should see an eye doctor and dentist every year.  It is very important to get all recommended vaccinations.  Controlling your blood pressure and cholesterol drastically reduces the damage diabetes does to your body.  Those who smoke should quit.  Please discuss these with your doctor.  check your blood sugar twice a week.  vary the time of day when you check, between before the 3 meals, and at bedtime.  also check if you have symptoms of your blood sugar being too high or too low.  please keep a record of the readings and bring it to your next appointment here (or you can bring the meter itself).  You can write it on any piece of paper.  please call us sooner if your blood sugar goes below 70, or if you have a lot of readings over 200.   I have sent a prescription to your pharmacy, to change metformin to "tradjenta." Please come back for a follow-up appointment in 3 months

## 2017-03-20 ENCOUNTER — Encounter: Payer: Self-pay | Admitting: Dietician

## 2017-03-20 ENCOUNTER — Encounter: Payer: 59 | Attending: Internal Medicine | Admitting: Dietician

## 2017-03-20 VITALS — Ht 72.0 in | Wt 214.7 lb

## 2017-03-20 DIAGNOSIS — Z713 Dietary counseling and surveillance: Secondary | ICD-10-CM | POA: Diagnosis not present

## 2017-03-20 DIAGNOSIS — E1122 Type 2 diabetes mellitus with diabetic chronic kidney disease: Secondary | ICD-10-CM | POA: Diagnosis not present

## 2017-03-20 DIAGNOSIS — N189 Chronic kidney disease, unspecified: Secondary | ICD-10-CM | POA: Insufficient documentation

## 2017-03-20 NOTE — Patient Instructions (Signed)
-   Practice reading nutrition labels to identify carbohydrates, sodium, potassium and phosphorus.  - Practice carbohydrate counting, trying to eat the same amount of carbohydrates at each meal and snack and eat at regular time intervals during the day  - Try to stick to the servings of each food group listed in the educational packet each day, sticking to mostly whole food choices and foods that are "low" in phosphorus and potassium  - Continue to drink water throughout the day instead of soda and juice, great job!

## 2017-03-20 NOTE — Progress Notes (Signed)
Medical Nutrition Therapy: Visit start time: 1030  end time: 1200  Assessment:  Diagnosis: CKD, DM type II Past medical history: HTN, peripheral neuropathy, see chart Psychosocial issues/ stress concerns: depression due to constant pain and new medical diagnosis Preferred learning method:  . No preference indicated  Current weight: 214.7lb  Height:  Medications, supplements: tradjenta, atorvastatin, gabapentin, colace, see chart for full listing  Progress and evaluation: Patient's goal wt is 170#, currently 44# above goal wt. He reports last being at his goal wt in 2005. Main educational topics today include weight reduction and diet for management of CKD and DM type II; patient reports having no prior nutrition education on either chronic condition. Interventions within the last month include "limiting everything" d/t not knowing what to eat other than information gathered on the Internet, removing unhealthy snack items from the house, choosing healthier options when eating out, swapping juice and soda for non-caloric carbonated beverages, eating no red meat. Expresses willingness to eat however he needs to in order to stop/slow progression of CKD.  Physical activity: limited due to joint pain but would like to start  Dietary Intake:  Usual eating pattern includes 3 meals and 0-1 snacks per day. Dining out frequency: 19 meals per week.  Breakfast: used to eat "regular" breakfasts with toast & juice, now just cereal Snack: n/a Lunch: chef salads Snack: n/a Supper: fish or chicken, vegetables, stewed apples Snack: n/a Beverages: has eliminated juice consumption, water, occasionally ginerale to "bring sugar back up"  Nutrition Care Education: Topics covered: role of fiber in management of diabetes and high cholesterol, ID'd foods with: carbohydrates, phosphorus, protein & potassium in relation to CKD (and provided individualized target goals for each relevent macro/micronutrient group),  role of diabetes management in the management of CKD, wt loss r/t decreasing joint pain Basic nutrition: basic food groups, appropriate nutrient balance, appropriate meal and snack schedule, general nutrition guidelines    Weight control: benefits of weight control, behavioral changes for weight loss Advanced nutrition: dining out, food label reading Diabetes:  appropriate meal and snack schedule, Carb counting, appropriate carb intake and balance Hypertension:  importance of controlling BP, identifying high sodium foods Other lifestyle changes:  benefits of making changes, increasing motivation, readiness for change, identifying habits that need to change  Nutritional Diagnosis:  NB-1.1 Food and nutrition-related knowledge deficit As related to CKD and DM type II.  As evidenced by patient report of never having prior education and inability to list sources of CHO or foods to limit with CKD.  Intervention: Discussion as noted above. Goals to work on until follow up session in 4wks were established and provided to patient. RD to provide general meal plan to guide patient on how to eat in order to better manage chronic conditions. Nutrition education provided on topics listed above.  Education Materials given:  Marland Kitchen Medical Nutrition therapy for Chronic Kidney Disease and Type II Diabetes . Carb Counting hard stock with daily carb choice allotment and carb servings . Goals/ instructions  Learner/ who was taught:  . Patient   Level of understanding: . Partial understanding; needs review/ practice  Demonstrated degree of understanding via:   Teach back Learning barriers: . Comprehension  Willingness to learn/ readiness for change: . Eager, change in progress  Monitoring and Evaluation:  Dietary intake, exercise, diabetes and CKD related lab values, and body weight      follow up: in 4 week(s)

## 2017-03-21 ENCOUNTER — Ambulatory Visit
Admission: RE | Admit: 2017-03-21 | Discharge: 2017-03-21 | Disposition: A | Discharge status: Home / Self Care | Payer: 59 | Source: Ambulatory Visit | Attending: Internal Medicine | Admitting: Internal Medicine

## 2017-03-21 ENCOUNTER — Other Ambulatory Visit: Payer: Self-pay | Admitting: Internal Medicine

## 2017-03-21 DIAGNOSIS — M5442 Lumbago with sciatica, left side: Secondary | ICD-10-CM | POA: Diagnosis not present

## 2017-03-21 DIAGNOSIS — S1095XA Superficial foreign body of unspecified part of neck, initial encounter: Secondary | ICD-10-CM | POA: Diagnosis not present

## 2017-03-21 DIAGNOSIS — M542 Cervicalgia: Secondary | ICD-10-CM | POA: Insufficient documentation

## 2017-03-21 DIAGNOSIS — M5124 Other intervertebral disc displacement, thoracic region: Secondary | ICD-10-CM | POA: Insufficient documentation

## 2017-03-21 DIAGNOSIS — M795 Residual foreign body in soft tissue: Secondary | ICD-10-CM

## 2017-03-21 DIAGNOSIS — M546 Pain in thoracic spine: Secondary | ICD-10-CM | POA: Diagnosis not present

## 2017-03-21 DIAGNOSIS — M47896 Other spondylosis, lumbar region: Secondary | ICD-10-CM | POA: Insufficient documentation

## 2017-03-21 DIAGNOSIS — G8929 Other chronic pain: Secondary | ICD-10-CM

## 2017-03-21 DIAGNOSIS — M47816 Spondylosis without myelopathy or radiculopathy, lumbar region: Secondary | ICD-10-CM | POA: Diagnosis not present

## 2017-03-21 DIAGNOSIS — Z5309 Procedure and treatment not carried out because of other contraindication: Secondary | ICD-10-CM | POA: Insufficient documentation

## 2017-03-25 ENCOUNTER — Telehealth: Payer: Self-pay | Admitting: Endocrinology

## 2017-03-25 NOTE — Telephone Encounter (Signed)
I contacted the patient. He wanted to confirm if tradjenta was to replace the metformin. Patient was advised this is correct and he had no further questions at this time. Patient stated his blood sugars have been running in the normal range and he has been feeling better on the tradjenta.

## 2017-03-25 NOTE — Telephone Encounter (Signed)
Patient is calling for the correct dosage of his medication   please advise

## 2017-04-08 ENCOUNTER — Ambulatory Visit (INDEPENDENT_AMBULATORY_CARE_PROVIDER_SITE_OTHER): Payer: 59 | Admitting: Urology

## 2017-04-08 ENCOUNTER — Encounter: Payer: Self-pay | Admitting: Urology

## 2017-04-08 VITALS — BP 106/79 | HR 70 | Ht 72.0 in | Wt 200.0 lb

## 2017-04-08 DIAGNOSIS — N182 Chronic kidney disease, stage 2 (mild): Secondary | ICD-10-CM

## 2017-04-08 DIAGNOSIS — N200 Calculus of kidney: Secondary | ICD-10-CM | POA: Diagnosis not present

## 2017-04-08 DIAGNOSIS — N261 Atrophy of kidney (terminal): Secondary | ICD-10-CM

## 2017-04-08 NOTE — Progress Notes (Signed)
04/08/2017 10:06 AM   Brian Butler 01-24-1966 [id number]  Referring provider: Reubin Milan, MD [address] [place], [place] [address]  Chief Complaint  Patient presents with  . Nephrolithiasis    3 month    HPI: 51 year old male with recurrent nephrolithiasis who returns today to review with litholink report on Kcitrate.  He reports that he establish care with a new primary who was concerned about potassium citrate, elevated potassium and his renal function. He is taking this medication twice per day.    Since last visit, he has been working with a nutritionist to avoid stone forming foods as well as improve his overall health and diet, diabetic control. He is trying his best to drink plenty of fluids.    No flank pain dysuria or gross hematuria. No stone episodes since last visit.  Litholink was report from 03/2017 reviewed, this is indicative of a very low urinary volume, 1.03 with a variance in creatinine from previous metabolic workup of greater than 20%. This is concerning for incomplete collection.    Stone history: Underwent ESWL 2 for 2 cm left distal ureteral stone complicated by steinstrasse ultimately underwent left ureteroscopy, laser lithotripsy with Dr. Sherryl Barters to clear the residual stone burden. Stent removed on 01/24/2016 and failed to follow up for RUS.    Returned with left lower quadrant pain, ultimately had a CT scan on 10/19/2016 which showed multiple large left distal ureteral calculi measuring up to 1 cm. He returned to the operating room on 10/20/2016 for left ureteroscopy, laser lithotripsy.  The stent was removed thereafter and follow-up renal ultrasound showed significant improvement in his left hydronephrosis from his preop CT but some mild residual fullness.  Follow-up renal ultrasound on 02/17/2017 showed complete resolution of hydronephrosis.  There are no other stones identified within the collecting system bilaterally. There is left  renal atrophy appreciated.  He does report today that he had an episode of left flank pain last week which lasted a few days and then resolved. He did not see a stone pass.  No gross hematuria or dysuria. No fevers or chills. He was concerned about a stone episode.  Previous stone analysis showed 50% uric acid, 80% calcium oxalate monohydrate, 5% calcium phosphate.  He did undergo 24-hour urine metabolic workup which revealed a low urinary volume of 1.4 L, urinary pH of 5.3, and a borderline elevated urinary calcium of 231 mg per day.     Uric acid levels were within normal limits.   He was started on 20 mEq of potassium citrate 3 times a day last visit.  Creatinine 1.45 which appears to be around his baseline back in 2014 of 1.36.   PMH: Past Medical History:  Diagnosis Date  . Chest pain    a. 03/2016 Myoview: medium defect of mild severity in basal anteroseptal, mid anteroseptal, and apical septal locations - partially reversible;  b. Cath: nl cors, EF 55-65%. EDP 16 mmHg.  Marland Kitchen Chronic back pain   . CKD (chronic kidney disease), stage III    [name] 04/21/2016  . FH: stroke 09/05/2015  . High cholesterol   . Hx of hypotestosteronemia 09/05/2015  . Hyperglycemia 09/05/2015  . Hypertension 09/05/2015  . Nephrolithiasis "several times"  . Plantar fasciitis, bilateral   . Uncontrolled type II diabetes mellitus [facility])     Surgical History: Past Surgical History:  Procedure Laterality Date  . ANTERIOR CERVICAL DECOMP/DISCECTOMY FUSION  2006  . CARDIAC CATHETERIZATION N/A 04/23/2016   Procedure:  Left Heart Cath and Coronary Angiography;  Surgeon: Lyn Records, MD;  Location: [facility] INVASIVE CV LAB;  Service: Cardiovascular;  Laterality: N/A;  . CYSTOSCOPY W/ RETROGRADES Left 01/16/2016   Procedure: CYSTOSCOPY WITH RETROGRADE PYELOGRAM;  Surgeon: Hildred Laser, MD;  Location: [facility] ORS;  Service: Urology;  Laterality: Left;  . CYSTOSCOPY WITH STENT PLACEMENT Left 12/05/2015   Procedure:  CYSTOSCOPY WITH STENT PLACEMENT;  Surgeon: Hildred Laser, MD;  Location: [facility] ORS;  Service: Urology;  Laterality: Left;  . CYSTOSCOPY WITH STENT PLACEMENT Left 01/16/2016   Procedure: CYSTOSCOPY WITH STENT PLACEMENT/ EXCHANGE;  Surgeon: Hildred Laser, MD;  Location: [facility] ORS;  Service: Urology;  Laterality: Left;  . CYSTOSCOPY/URETEROSCOPY/HOLMIUM LASER/STENT PLACEMENT Left 10/20/2016   Procedure: CYSTOSCOPY/URETEROSCOPY/HOLMIUM LASER/STENT PLACEMENT;  Surgeon: Vanna Scotland, MD;  Location: [facility] ORS;  Service: Urology;  Laterality: Left;  . EXTRACORPOREAL SHOCK WAVE LITHOTRIPSY Left 09/20/2015   Procedure: EXTRACORPOREAL SHOCK WAVE LITHOTRIPSY (ESWL);  Surgeon: Hildred Laser, MD;  Location: [facility] ORS;  Service: Urology;  Laterality: Left;  . EXTRACORPOREAL SHOCK WAVE LITHOTRIPSY Left 11/15/2015   Procedure: EXTRACORPOREAL SHOCK WAVE LITHOTRIPSY (ESWL);  Surgeon: Vanna Scotland, MD;  Location: [facility] ORS;  Service: Urology;  Laterality: Left;  . URETEROSCOPY WITH HOLMIUM LASER LITHOTRIPSY Left 12/05/2015   Procedure: URETEROSCOPY WITH HOLMIUM LASER LITHOTRIPSY;  Surgeon: Hildred Laser, MD;  Location: [facility] ORS;  Service: Urology;  Laterality: Left;  . URETEROSCOPY WITH HOLMIUM LASER LITHOTRIPSY Left 01/16/2016   Procedure: URETEROSCOPY WITH HOLMIUM LASER LITHOTRIPSY;  Surgeon: Hildred Laser, MD;  Location: [facility] ORS;  Service: Urology;  Laterality: Left;    Home Medications:  Allergies as of 04/08/2017      Reactions   Cymbalta [duloxetine Hcl] Other (See Comments)   insomnia      Medication List       Accurate as of 04/08/17 10:06 AM. Always use your most recent med list.          aspirin 325 MG EC tablet Take 1 tablet (325 mg total) by mouth daily.   atorvastatin 10 MG tablet Commonly known as:  LIPITOR Take 1 tablet (10 mg total) by mouth daily at 6 PM.   docusate sodium 100 MG capsule Commonly known as:  COLACE Take 1 capsule (100 mg total) by mouth 2 (two)  times daily.   gabapentin 300 MG capsule Commonly known as:  NEURONTIN Take 1 capsule (300 mg total) by mouth 2 (two) times daily. And take 2 at bedtime   linagliptin 5 MG Tabs tablet Commonly known as:  TRADJENTA Take 1 tablet (5 mg total) by mouth daily.   lisinopril 10 MG tablet Commonly known as:  PRINIVIL,ZESTRIL Take 1 tablet (10 mg total) by mouth daily.   meloxicam 15 MG tablet Commonly known as:  MOBIC Take 1 tablet (15 mg total) by mouth daily.   potassium citrate 10 MEQ (1080 MG) SR tablet Commonly known as:  UROCIT-K Take 2 tablets (20 mEq total) by mouth 3 (three) times daily with meals.       Allergies:  Allergies  Allergen Reactions  . Cymbalta [Duloxetine Hcl] Other (See Comments)    insomnia    Family History: Family History  Problem Relation Age of Onset  . Stroke Father   . Nephrolithiasis Father   . Diabetes type I Maternal Grandfather   . Prostate cancer Neg Hx   . Bladder Cancer Neg Hx     Social History:  reports that he has never smoked. He has  never used smokeless tobacco. He reports that he does not drink alcohol or use drugs.  ROS: UROLOGY Frequent Urination?: No Hard to postpone urination?: No Burning/pain with urination?: No Get up at night to urinate?: Yes Leakage of urine?: No Urine stream starts and stops?: No Trouble starting stream?: No Do you have to strain to urinate?: No Blood in urine?: No Urinary tract infection?: No Sexually transmitted disease?: No Injury to kidneys or bladder?: No Painful intercourse?: No Weak stream?: No Erection problems?: No Penile pain?: No  Gastrointestinal Nausea?: No Vomiting?: No Indigestion/heartburn?: No Diarrhea?: No Constipation?: No  Constitutional Fever: No Night sweats?: No Weight loss?: No Fatigue?: Yes  Skin Skin rash/lesions?: No Itching?: No  Eyes Blurred vision?: No Double vision?: No  Ears/Nose/Throat Sore throat?: No Sinus problems?:  No  Hematologic/Lymphatic Swollen glands?: No Easy bruising?: No  Cardiovascular Leg swelling?: No Chest pain?: No  Respiratory Cough?: No Shortness of breath?: No  Endocrine Excessive thirst?: No  Musculoskeletal Back pain?: Yes Joint pain?: Yes  Neurological Headaches?: No Dizziness?: No  Psychologic Depression?: No Anxiety?: Yes  Physical Exam: BP 106/79   Pulse 70   Ht 6' (1.829 m)   Wt 200 lb (90.7 kg)   BMI 27.12 kg/m   Constitutional:  Alert and oriented, No acute distress. HEENT: [place] AT, moist mucus membranes.  Trachea midline, no masses. Cardiovascular: No clubbing, cyanosis, or edema. Respiratory: Normal respiratory effort, no increased work of breathing. GI: Abdomen is soft, nondistended, no abdominal masses.   GU: No CVA tenderness, unchanged. Skin: No rashes, bruises or suspicious lesions. Neurologic: Grossly intact, no focal deficits, moving all 4 extremities. Psychiatric: Mildly anxious today.    Laboratory Data: Lab Results  Component Value Date   WBC 7.8 10/19/2016   HGB 14.4 10/19/2016   HCT 40.6 10/19/2016   MCV 87.5 10/19/2016   PLT 188 10/19/2016    Lab Results  Component Value Date   CREATININE 1.35 (H) 01/23/2017     Lab Results  Component Value Date   TESTOSTERONE 244 (L) 09/05/2015    Lab Results  Component Value Date   HGBA1C 6.7 03/18/2017    Pertinent Imaging: No new interval imaging  Assessment & Plan:    1. Recurrent nephrolithiasis Continue potassiums citrate 20 mEq 3 times a day- recheck bmp today to monitor K Most importantly, adequate oral intake of fluid was stressed again today including goal of 2.5 L of urine output daily. Both 24-hour urines have been markedly low in volume. Advised to call or return ASAP if develops recurrent left flank pain We will monitor him clinically with repeat KUB in 4 months  2. Left renal atrophy Discussed presence of left-sided cortical atrophy presumably related to  recurrent stone episodes on this time Increased risk for chronic kidney disease with progression, offered referral to nephrology again today due to patients concern- agreed to see [facility] Reviewed importance of close blood pressure and diabetes control as well as early intervention for any obstructing stones Hydration !!!!  3. CKD (chronic kidney disease) stage 2, GFR 60-89 ml/min As above, requested repeat BMP today, will call with results  Return in about 4 months (around 08/09/2017) for KUB.  Vanna ScotlandAshley Toniesha Zellner, MD  [facility] [address] [place], [place] [address] [phone]

## 2017-04-09 LAB — BASIC METABOLIC PANEL
BUN/Creatinine Ratio: 13 (ref 9–20)
BUN: 16 mg/dL (ref 6–24)
CALCIUM: 9.2 mg/dL (ref 8.7–10.2)
CHLORIDE: 101 mmol/L (ref 96–106)
CO2: 27 mmol/L (ref 18–29)
Creatinine, Ser: 1.26 mg/dL (ref 0.76–1.27)
GFR, EST AFRICAN AMERICAN: 76 mL/min/{1.73_m2} (ref >59)
GFR, EST NON AFRICAN AMERICAN: 66 mL/min/{1.73_m2} (ref >59)
Glucose: 106 mg/dL — ABNORMAL HIGH (ref 65–99)
Potassium: 4.4 mmol/L (ref 3.5–5.2)
Sodium: 138 mmol/L (ref 134–144)

## 2017-04-17 ENCOUNTER — Encounter: Payer: Self-pay | Admitting: Dietician

## 2017-04-17 ENCOUNTER — Encounter: Payer: 59 | Attending: Internal Medicine | Admitting: Dietician

## 2017-04-17 VITALS — Wt 207.1 lb

## 2017-04-17 DIAGNOSIS — N189 Chronic kidney disease, unspecified: Secondary | ICD-10-CM | POA: Insufficient documentation

## 2017-04-17 DIAGNOSIS — Z713 Dietary counseling and surveillance: Secondary | ICD-10-CM | POA: Diagnosis not present

## 2017-04-17 DIAGNOSIS — E1122 Type 2 diabetes mellitus with diabetic chronic kidney disease: Secondary | ICD-10-CM | POA: Insufficient documentation

## 2017-04-17 NOTE — Progress Notes (Signed)
Medical Nutrition Therapy: Visit start time: 0900  end time: 0930  Assessment:  Diagnosis: CKD, DM type II Medical history changes: none Psychosocial issues/ stress concerns: none  Current weight: 207.1lb  Height: 6' Medications, supplement changes: no longer taking potassium supplement  Progress and evaluation: Patient reports making several changes to his diet and eating habits since initial appointment. Has been sticking to portions and foods recommended in CKD and DM MNT packet provided. Has cut out high fat foods and has not eaten fast food. States he was unaware of how many habits and "unconscious" decisions he was making regarding food choices. CBW reflects wt loss of 7# x30 days and 16# x60 days. Weight loss desired by patient and was done intentionally through dietary intervention. Reports goal wt of 170-175#. States most recent doctor's visit revealed improved glucose and kidney-related labs. (04/08/17) BMP shows GLU reduced to from 214 to 106 in two months, Potassium reduced from 4.8 to 4.4 in two months, and Creat reduced from 1.35 to 1.26 in two months. Other outcomes of dietary interventions include feeling more energized and feeling more hunger throughout the day.   Physical activity: none  Dietary Intake:  Usual eating pattern includes 3 meals and 0-1 snacks per day.  Breakfast: small portion cereal Snack: n/a Lunch: chef salad with either egg, chicken or fish as the protein Snack: n/a Supper: chicken or fish with salad or a vegetable side Snack: crackers Beverages: water or favored water  Nutrition Care Education: Topics covered: eating regularly and incorporating snacks to help manage diabetes and combat increased hunger, general meal plan provided was explained and reviewed, range for # of body weight to lose per week for sustainable weight loss (.5-2#) Basic nutrition: basic food groups, appropriate nutrient balance, appropriate meal and snack schedule, general nutrition  guidelines    Weight control: benefits of weight control, determining reasonable weight goal, behavioral changes for weight loss Advanced nutrition: food label reading Diabetes: appropriate meal and snack schedule  Nutritional Diagnosis:  NI-5.7.2 Excessive protein intake As related to CKD.  As evidenced by patient report of eating at least 4oz of protein at meal times.  Intervention: Discussion as noted above. Patient is to continue on working to make dietary changes for improved management of CKD and DM type II. Per report he has followed guidelines previously provided "to a T." Goal going forward is for patient to monitor protein portion sizes more closely.  Education Materials given:  . General Meal Plan based on individualized nutritional needs  Learner/ who was taught:  . Patient  Level of understanding: Marland Kitchen. Verbalizes/ demonstrates competency  Demonstrated degree of understanding via:   Teach back Learning barriers: . None  Willingness to learn/ readiness for change: . Eager, change in progress  Monitoring and Evaluation:  Dietary intake, exercise, kidney and diabetes-related labs, and body weight      follow up: prn

## 2017-05-04 DIAGNOSIS — E1129 Type 2 diabetes mellitus with other diabetic kidney complication: Secondary | ICD-10-CM | POA: Diagnosis not present

## 2017-05-04 DIAGNOSIS — E785 Hyperlipidemia, unspecified: Secondary | ICD-10-CM | POA: Diagnosis not present

## 2017-05-04 DIAGNOSIS — N200 Calculus of kidney: Secondary | ICD-10-CM | POA: Diagnosis not present

## 2017-05-04 DIAGNOSIS — I129 Hypertensive chronic kidney disease with stage 1 through stage 4 chronic kidney disease, or unspecified chronic kidney disease: Secondary | ICD-10-CM | POA: Diagnosis not present

## 2017-05-04 DIAGNOSIS — E1140 Type 2 diabetes mellitus with diabetic neuropathy, unspecified: Secondary | ICD-10-CM | POA: Diagnosis not present

## 2017-05-04 DIAGNOSIS — N182 Chronic kidney disease, stage 2 (mild): Secondary | ICD-10-CM | POA: Diagnosis not present

## 2017-05-04 DIAGNOSIS — N261 Atrophy of kidney (terminal): Secondary | ICD-10-CM | POA: Diagnosis not present

## 2017-05-04 DIAGNOSIS — Z6827 Body mass index (BMI) 27.0-27.9, adult: Secondary | ICD-10-CM | POA: Diagnosis not present

## 2017-05-06 DIAGNOSIS — N182 Chronic kidney disease, stage 2 (mild): Secondary | ICD-10-CM | POA: Diagnosis not present

## 2017-05-18 DIAGNOSIS — E785 Hyperlipidemia, unspecified: Secondary | ICD-10-CM | POA: Diagnosis not present

## 2017-05-18 DIAGNOSIS — N200 Calculus of kidney: Secondary | ICD-10-CM | POA: Diagnosis not present

## 2017-05-18 DIAGNOSIS — I129 Hypertensive chronic kidney disease with stage 1 through stage 4 chronic kidney disease, or unspecified chronic kidney disease: Secondary | ICD-10-CM | POA: Diagnosis not present

## 2017-05-18 DIAGNOSIS — E1129 Type 2 diabetes mellitus with other diabetic kidney complication: Secondary | ICD-10-CM | POA: Diagnosis not present

## 2017-05-18 DIAGNOSIS — Z6827 Body mass index (BMI) 27.0-27.9, adult: Secondary | ICD-10-CM | POA: Diagnosis not present

## 2017-05-18 DIAGNOSIS — N182 Chronic kidney disease, stage 2 (mild): Secondary | ICD-10-CM | POA: Diagnosis not present

## 2017-05-18 DIAGNOSIS — N261 Atrophy of kidney (terminal): Secondary | ICD-10-CM | POA: Diagnosis not present

## 2017-05-21 NOTE — Research (Signed)
CAD LAD Informed Consent   Subject Name: Brian Butler  Subject met inclusion and exclusion criteria.  The informed consent form, study requirements and expectations were reviewed with the subject and questions and concerns were addressed prior to the signing of the consent form.  The subject verbalized understanding of the trail requirements.  The subject agreed to participate in the CAD LAD trial and signed the informed consent.  The informed consent was obtained prior to performance of any protocol-specific procedures for the subject.  A copy of the signed informed consent was given to the subject and a copy was placed in the subject's medical record.  Blossom Hoops , 04/23/2016 at 1140

## 2017-05-26 DIAGNOSIS — M159 Polyosteoarthritis, unspecified: Secondary | ICD-10-CM | POA: Diagnosis not present

## 2017-05-26 DIAGNOSIS — E1140 Type 2 diabetes mellitus with diabetic neuropathy, unspecified: Secondary | ICD-10-CM | POA: Diagnosis not present

## 2017-05-26 DIAGNOSIS — I10 Essential (primary) hypertension: Secondary | ICD-10-CM | POA: Diagnosis not present

## 2017-05-26 DIAGNOSIS — N182 Chronic kidney disease, stage 2 (mild): Secondary | ICD-10-CM | POA: Diagnosis not present

## 2017-06-02 DIAGNOSIS — J208 Acute bronchitis due to other specified organisms: Secondary | ICD-10-CM | POA: Diagnosis not present

## 2017-06-02 DIAGNOSIS — N182 Chronic kidney disease, stage 2 (mild): Secondary | ICD-10-CM | POA: Diagnosis not present

## 2017-06-02 DIAGNOSIS — I10 Essential (primary) hypertension: Secondary | ICD-10-CM | POA: Diagnosis not present

## 2017-06-02 DIAGNOSIS — E1140 Type 2 diabetes mellitus with diabetic neuropathy, unspecified: Secondary | ICD-10-CM | POA: Diagnosis not present

## 2017-06-02 DIAGNOSIS — R531 Weakness: Secondary | ICD-10-CM | POA: Diagnosis not present

## 2017-06-02 DIAGNOSIS — J209 Acute bronchitis, unspecified: Secondary | ICD-10-CM | POA: Diagnosis not present

## 2017-06-17 ENCOUNTER — Ambulatory Visit (INDEPENDENT_AMBULATORY_CARE_PROVIDER_SITE_OTHER): Payer: 59 | Admitting: Endocrinology

## 2017-06-17 ENCOUNTER — Encounter: Payer: Self-pay | Admitting: Endocrinology

## 2017-06-17 VITALS — BP 132/84 | HR 72 | Ht 72.0 in | Wt 206.0 lb

## 2017-06-17 DIAGNOSIS — E1142 Type 2 diabetes mellitus with diabetic polyneuropathy: Secondary | ICD-10-CM | POA: Diagnosis not present

## 2017-06-17 LAB — POCT GLYCOSYLATED HEMOGLOBIN (HGB A1C): Hemoglobin A1C: 6.2

## 2017-06-17 NOTE — Progress Notes (Signed)
Subjective:    Patient ID: Brian Butler, male    DOB: 1966/01/04, 51 y.o.   MRN: [id number]  HPI Pt returns for f/u of diabetes mellitus: DM type: 2 Dx'ed: 2016 Complications: polyneuropathy and renal insufficiency Therapy: tradjenta DKA: never Severe hypoglycemia: never Pancreatitis: never Pancreatic imaging: normal on 2017 CT Other: he has never been on insulin; rx options are limits by renal insuff and edema.  Interval history: main symptom is generalized arthralgias. He says cbg's are well-controlled.  Past Medical History:  Diagnosis Date  . Chest pain    a. 03/2016 Myoview: medium defect of mild severity in basal anteroseptal, mid anteroseptal, and apical septal locations - partially reversible;  b. Cath: nl cors, EF 55-65%. EDP 16 mmHg.  Marland Kitchen. Chronic back pain   . CKD (chronic kidney disease), stage III    [name]/notes 04/21/2016  . FH: stroke 09/05/2015  . High cholesterol   . Hx of hypotestosteronemia 09/05/2015  . Hyperglycemia 09/05/2015  . Hypertension 09/05/2015  . Nephrolithiasis "several times"  . Plantar fasciitis, bilateral   . Uncontrolled type II diabetes mellitus (HCC)     Past Surgical History:  Procedure Laterality Date  . ANTERIOR CERVICAL DECOMP/DISCECTOMY FUSION  2006  . CARDIAC CATHETERIZATION N/A 04/23/2016   Procedure: Left Heart Cath and Coronary Angiography;  Surgeon: Lyn RecordsHenry W Smith, MD;  [facility];  Service: Cardiovascular;  Laterality: N/A;  . CYSTOSCOPY W/ RETROGRADES Left 01/16/2016   Procedure: CYSTOSCOPY WITH RETROGRADE PYELOGRAM;  Surgeon: Hildred LaserBrian James Budzyn, MD;  Location: [facility] ORS;  Service: Urology;  Laterality: Left;  . CYSTOSCOPY WITH STENT PLACEMENT Left 12/05/2015   Procedure: CYSTOSCOPY WITH STENT PLACEMENT;  Surgeon: Hildred LaserBrian James Budzyn, MD;  Location: [facility] ORS;  Service: Urology;  Laterality: Left;  . CYSTOSCOPY WITH STENT PLACEMENT Left 01/16/2016   Procedure: CYSTOSCOPY WITH STENT PLACEMENT/ EXCHANGE;  Surgeon: Hildred LaserBrian James  Budzyn, MD;  Location: [facility] OR[facility];  Service: Urology;  Laterality: Left;  . CYSTOSCOPY/URETEROSCOPY/HOLMIUM LASER/STENT PLACEMENT Left 10/20/2016   Procedure: CYSTOSCOPY/URETEROSCOPY/HOLMIUM LASER/STENT PLACEMENT;  Surgeon: Vanna ScotlandAshley Brandon, MD;  Location: [facility] ORS;  Service: Urology;  Laterality: Left;  . EXTRACORPOREAL SHOCK WAVE LITHOTRIPSY Left 09/20/2015   Procedure: EXTRACORPOREAL SHOCK WAVE LITHOTRIPSY (ESWL);  Surgeon: Hildred LaserBrian James Budzyn, MD;  Location: [facility];  Service: Urology;  Laterality: Left;  . EXTRACORPOREAL SHOCK WAVE LITHOTRIPSY Left 11/15/2015   Procedure: EXTRACORPOREAL SHOCK WAVE LITHOTRIPSY (ESWL);  Surgeon: Vanna ScotlandAshley Brandon, MD;  Location: [facility];  Service: Urology;  Laterality: Left;  . URETEROSCOPY WITH HOLMIUM LASER LITHOTRIPSY Left 12/05/2015   Procedure: URETEROSCOPY WITH HOLMIUM LASER LITHOTRIPSY;  Surgeon: Hildred LaserBrian James Budzyn, MD;  Location: [facility];  Service: Urology;  Laterality: Left;  . URETEROSCOPY WITH HOLMIUM LASER LITHOTRIPSY Left 01/16/2016   Procedure: URETEROSCOPY WITH HOLMIUM LASER LITHOTRIPSY;  Surgeon: Hildred LaserBrian James Budzyn, MD;  Location: [facility];  Service: Urology;  Laterality: Left;    Social History   Social History  . Marital status: Married    Spouse name: N/A  . Number of children: N/A  . Years of education: N/A   Occupational History  . Not on file.   Social History Main Topics  . Smoking status: Never Smoker  . Smokeless tobacco: Never Used  . Alcohol use No  . Drug use: No  . Sexual activity: Not Currently   Other Topics Concern  . Not on file   Social History Narrative  . No narrative on file    Current Outpatient Prescriptions on File Prior to  Visit  Medication Sig Dispense Refill  . aspirin 325 MG EC tablet Take 1 tablet (325 mg total) by mouth daily. 30 tablet 0  . atorvastatin (LIPITOR) 10 MG tablet Take 1 tablet (10 mg total) by mouth daily at 6 PM. 90 tablet 3  . docusate sodium (COLACE) 100 MG capsule Take 1 capsule (100  mg total) by mouth 2 (two) times daily. 60 capsule 0  . gabapentin (NEURONTIN) 300 MG capsule Take 1 capsule (300 mg total) by mouth 2 (two) times daily. And take 2 at bedtime 360 capsule 1  . linagliptin (TRADJENTA) 5 MG TABS tablet Take 1 tablet (5 mg total) by mouth daily. 30 tablet 11  . lisinopril (PRINIVIL,ZESTRIL) 10 MG tablet Take 1 tablet (10 mg total) by mouth daily. 90 tablet 3  . meloxicam (MOBIC) 15 MG tablet Take 1 tablet (15 mg total) by mouth daily. 90 tablet 0  . potassium citrate (UROCIT-K) 10 MEQ (1080 MG) SR tablet Take 2 tablets (20 mEq total) by mouth 3 (three) times daily with meals. (Patient taking differently: Take 20 mEq by mouth 2 (two) times daily. ) 90 tablet 11   No current facility-administered medications on file prior to visit.     Allergies  Allergen Reactions  . Cymbalta [Duloxetine Hcl] Other (See Comments)    insomnia    Family History  Problem Relation Age of Onset  . Stroke Father   . Nephrolithiasis Father   . Diabetes type I Maternal Grandfather   . Prostate cancer Neg Hx   . Bladder Cancer Neg Hx     BP 132/84   Pulse 72   Ht 6' (1.829 m)   Wt 206 lb (93.4 kg)   SpO2 97%   BMI 27.94 kg/m    Review of Systems He denies hypoglycemia    Objective:   Physical Exam VITAL SIGNS:  See vs page GENERAL: no distress Pulses: foot pulses are intact bilaterally.   MSK: no deformity of the feet or ankles.  CV: no edema of the legs or ankles Skin:  no ulcer on the feet or ankles.  normal color and temp on the feet and ankles.  Neuro: sensation is intact to touch on the feet and ankles, but decreased from normal   A1c=6.2%    Assessment & Plan:  Type 2 DM, with polyneuropathy: well-controlled Renal insuff: this limits rx options  Patient Instructions  check your blood sugar twice a week.  vary the time of day when you check, between before the 3 meals, and at bedtime.  also check if you have symptoms of your blood sugar being too high or  too low.  please keep a record of the readings and bring it to your next appointment here (or you can bring the meter itself).  You can write it on any piece of paper.  please call us sooner if your blood sugar goes below 70, or if you have a lot of readings over 200.   Please continue the same medication.  Please come back for a follow-up appointment in 6 months.

## 2017-06-17 NOTE — Patient Instructions (Signed)
check your blood sugar twice a week.  vary the time of day when you check, between before the 3 meals, and at bedtime.  also check if you have symptoms of your blood sugar being too high or too low.  please keep a record of the readings and bring it to your next appointment here (or you can bring the meter itself).  You can write it on any piece of paper.  please call us sooner if your blood sugar goes below 70, or if you have a lot of readings over 200.   Please continue the same medication.  Please come back for a follow-up appointment in 6 months.

## 2017-07-13 ENCOUNTER — Other Ambulatory Visit: Payer: 59

## 2017-07-13 ENCOUNTER — Other Ambulatory Visit: Payer: Self-pay | Admitting: Nephrology

## 2017-07-13 ENCOUNTER — Other Ambulatory Visit: Payer: Self-pay | Admitting: Internal Medicine

## 2017-07-13 ENCOUNTER — Ambulatory Visit
Admission: RE | Admit: 2017-07-13 | Discharge: 2017-07-13 | Disposition: A | Discharge status: Home / Self Care | Payer: 59 | Source: Ambulatory Visit | Attending: Nephrology | Admitting: Nephrology

## 2017-07-13 ENCOUNTER — Other Ambulatory Visit: Payer: Self-pay | Admitting: Family Medicine

## 2017-07-13 DIAGNOSIS — R109 Unspecified abdominal pain: Secondary | ICD-10-CM

## 2017-07-13 DIAGNOSIS — E1129 Type 2 diabetes mellitus with other diabetic kidney complication: Secondary | ICD-10-CM | POA: Diagnosis not present

## 2017-07-13 DIAGNOSIS — Z6828 Body mass index (BMI) 28.0-28.9, adult: Secondary | ICD-10-CM | POA: Diagnosis not present

## 2017-07-13 DIAGNOSIS — I129 Hypertensive chronic kidney disease with stage 1 through stage 4 chronic kidney disease, or unspecified chronic kidney disease: Secondary | ICD-10-CM | POA: Diagnosis not present

## 2017-07-13 DIAGNOSIS — N200 Calculus of kidney: Secondary | ICD-10-CM | POA: Diagnosis not present

## 2017-07-13 DIAGNOSIS — N182 Chronic kidney disease, stage 2 (mild): Secondary | ICD-10-CM | POA: Diagnosis not present

## 2017-07-13 DIAGNOSIS — E1142 Type 2 diabetes mellitus with diabetic polyneuropathy: Secondary | ICD-10-CM

## 2017-07-13 DIAGNOSIS — E785 Hyperlipidemia, unspecified: Secondary | ICD-10-CM | POA: Diagnosis not present

## 2017-07-13 DIAGNOSIS — N261 Atrophy of kidney (terminal): Secondary | ICD-10-CM | POA: Diagnosis not present

## 2017-07-13 DIAGNOSIS — N201 Calculus of ureter: Secondary | ICD-10-CM | POA: Diagnosis not present

## 2017-07-13 DIAGNOSIS — M722 Plantar fascial fibromatosis: Secondary | ICD-10-CM | POA: Diagnosis not present

## 2017-07-13 DIAGNOSIS — I10 Essential (primary) hypertension: Secondary | ICD-10-CM

## 2017-07-15 ENCOUNTER — Other Ambulatory Visit: Payer: Self-pay | Admitting: Family Medicine

## 2017-07-15 DIAGNOSIS — I10 Essential (primary) hypertension: Secondary | ICD-10-CM

## 2017-07-17 ENCOUNTER — Encounter: Payer: Self-pay | Admitting: Urology

## 2017-07-17 ENCOUNTER — Ambulatory Visit (INDEPENDENT_AMBULATORY_CARE_PROVIDER_SITE_OTHER): Payer: 59 | Admitting: Urology

## 2017-07-17 VITALS — BP 146/73 | HR 80 | Ht 72.0 in | Wt 200.0 lb

## 2017-07-17 DIAGNOSIS — N200 Calculus of kidney: Secondary | ICD-10-CM

## 2017-07-17 DIAGNOSIS — N201 Calculus of ureter: Secondary | ICD-10-CM | POA: Diagnosis not present

## 2017-07-17 LAB — URINALYSIS, COMPLETE
Bilirubin, UA: NEGATIVE
Glucose, UA: NEGATIVE
Ketones, UA: NEGATIVE
Leukocytes, UA: NEGATIVE
NITRITE UA: NEGATIVE
PH UA: 7 (ref 5.0–7.5)
Protein, UA: NEGATIVE
RBC UA: NEGATIVE
SPEC GRAV UA: 1.02 (ref 1.005–1.030)
Urobilinogen, Ur: 0.2 mg/dL (ref 0.2–1.0)

## 2017-07-17 MED ORDER — TAMSULOSIN HCL 0.4 MG PO CAPS: 0.4000 mg | ORAL_CAPSULE | Freq: Every day | ORAL | 0 refills | Status: DC

## 2017-07-17 NOTE — Progress Notes (Signed)
07/17/2017 1:43 PM   Brian Butler [birth year] [id number]  Referring provider: Barbette Reichmann, MD [address] [facility] [place], [place] [address]  Chief Complaint  Patient presents with  . Nephrolithiasis    HPI: 51 year old male with recurrent nephrolithiasis managed on potassium citrate returns today with left flank pain. He was seen and evaluated last week by nephrology. At that time, he was having dull left flank pressure concerning for stone episode.  He underwent a CT scan, noncontrast on 07/13/2017 found to have a 5 mm left distal ureteral calculus with a more proximal punctate stone in close proximity to this. There is no hydroureteronephrosis but left renal atrophy with some mild stranding was appreciated.  He continues to have some mild dull left flank pressure no severe pain. No associated nausea or vomiting. No significant urinary symptoms including dysuria or gross hematuria. No fevers or chills.  He continues to take potassium citrate twice a day. He's been pushing fluids. He's been aggressive about weight loss, exercise, and lifestyle modifications for stone prevention.   PMH: Past Medical History:  Diagnosis Date  . Chest pain    a. 03/2016 Myoview: medium defect of mild severity in basal anteroseptal, mid anteroseptal, and apical septal locations - partially reversible;  b. Cath: nl cors, EF 55-65%. EDP 16 mmHg.  Marland Kitchen Chronic back pain   . CKD (chronic kidney disease), stage III    [name] 04/21/2016  . FH: stroke 09/05/2015  . High cholesterol   . Hx of hypotestosteronemia 09/05/2015  . Hyperglycemia 09/05/2015  . Hypertension 09/05/2015  . Nephrolithiasis "several times"  . Plantar fasciitis, bilateral   . Uncontrolled type II diabetes mellitus [facility])     Surgical History: Past Surgical History:  Procedure Laterality Date  . ANTERIOR CERVICAL DECOMP/DISCECTOMY FUSION  2006  . CARDIAC CATHETERIZATION N/A 04/23/2016   Procedure: Left Heart  Cath and Coronary Angiography;  Surgeon: Lyn Records, MD;  Location: [facility] INVASIVE CV LAB;  Service: Cardiovascular;  Laterality: N/A;  . CYSTOSCOPY W/ RETROGRADES Left 01/16/2016   Procedure: CYSTOSCOPY WITH RETROGRADE PYELOGRAM;  Surgeon: Hildred Laser, MD;  Location: [facility] ORS;  Service: Urology;  Laterality: Left;  . CYSTOSCOPY WITH STENT PLACEMENT Left 12/05/2015   Procedure: CYSTOSCOPY WITH STENT PLACEMENT;  Surgeon: Hildred Laser, MD;  Location: [facility] ORS;  Service: Urology;  Laterality: Left;  . CYSTOSCOPY WITH STENT PLACEMENT Left 01/16/2016   Procedure: CYSTOSCOPY WITH STENT PLACEMENT/ EXCHANGE;  Surgeon: Hildred Laser, MD;  Location: [facility] ORS;  Service: Urology;  Laterality: Left;  . CYSTOSCOPY/URETEROSCOPY/HOLMIUM LASER/STENT PLACEMENT Left 10/20/2016   Procedure: CYSTOSCOPY/URETEROSCOPY/HOLMIUM LASER/STENT PLACEMENT;  Surgeon: Vanna Scotland, MD;  Location: [facility] ORS;  Service: Urology;  Laterality: Left;  . EXTRACORPOREAL SHOCK WAVE LITHOTRIPSY Left 09/20/2015   Procedure: EXTRACORPOREAL SHOCK WAVE LITHOTRIPSY (ESWL);  Surgeon: Hildred Laser, MD;  Location: [facility] ORS;  Service: Urology;  Laterality: Left;  . EXTRACORPOREAL SHOCK WAVE LITHOTRIPSY Left 11/15/2015   Procedure: EXTRACORPOREAL SHOCK WAVE LITHOTRIPSY (ESWL);  Surgeon: Vanna Scotland, MD;  Location: [facility] ORS;  Service: Urology;  Laterality: Left;  . URETEROSCOPY WITH HOLMIUM LASER LITHOTRIPSY Left 12/05/2015   Procedure: URETEROSCOPY WITH HOLMIUM LASER LITHOTRIPSY;  Surgeon: Hildred Laser, MD;  Location: [facility] OR[facility];  Service: Urology;  Laterality: Left;  . URETEROSCOPY WITH HOLMIUM LASER LITHOTRIPSY Left 01/16/2016   Procedure: URETEROSCOPY WITH HOLMIUM LASER LITHOTRIPSY;  Surgeon: Hildred Laser, MD;  Location: [facility] ORS;  Service: Urology;  Laterality: Left;    Home Medications:  Allergies as of 07/17/2017      Reactions   Cymbalta [duloxetine Hcl] Other (See Comments)   insomnia      Medication List         Accurate as of 07/17/17  1:43 PM. Always use your most recent med list.          aspirin 325 MG EC tablet Take 1 tablet (325 mg total) by mouth daily.   atorvastatin 10 MG tablet Commonly known as:  LIPITOR Take 1 tablet (10 mg total) by mouth daily at 6 PM.   docusate sodium 100 MG capsule Commonly known as:  COLACE Take 1 capsule (100 mg total) by mouth 2 (two) times daily.   gabapentin 300 MG capsule Commonly known as:  NEURONTIN TAKE 1 CAPSULE BY MOUTH TWICE DAILY AND 2 TABLETS AT BEDTIME   linagliptin 5 MG Tabs tablet Commonly known as:  TRADJENTA Take 1 tablet (5 mg total) by mouth daily.   lisinopril 10 MG tablet Commonly known as:  PRINIVIL,ZESTRIL Take 1 tablet (10 mg total) by mouth daily.   meloxicam 15 MG tablet Commonly known as:  MOBIC Take 1 tablet (15 mg total) by mouth daily.   potassium citrate 10 MEQ (1080 MG) SR tablet Commonly known as:  UROCIT-K Take 2 tablets (20 mEq total) by mouth 3 (three) times daily with meals.   tamsulosin 0.4 MG Caps capsule Commonly known as:  FLOMAX Take 1 capsule (0.4 mg total) by mouth daily.       Allergies:  Allergies  Allergen Reactions  . Cymbalta [Duloxetine Hcl] Other (See Comments)    insomnia    Family History: Family History  Problem Relation Age of Onset  . Stroke Father   . Nephrolithiasis Father   . Diabetes type I Maternal Grandfather   . Prostate cancer Neg Hx   . Bladder Cancer Neg Hx     Social History:  reports that he has never smoked. He has never used smokeless tobacco. He reports that he does not drink alcohol or use drugs.  ROS: UROLOGY Frequent Urination?: No Hard to postpone urination?: No Burning/pain with urination?: No Get up at night to urinate?: No Leakage of urine?: No Urine stream starts and stops?: No Trouble starting stream?: No Do you have to strain to urinate?: No Blood in urine?: No Urinary tract infection?: No Sexually transmitted disease?:  No Injury to kidneys or bladder?: No Painful intercourse?: No Weak stream?: No Erection problems?: No Penile pain?: No  Gastrointestinal Nausea?: No Vomiting?: No Indigestion/heartburn?: No Diarrhea?: No Constipation?: No  Constitutional Fever: Yes Night sweats?: Yes Weight loss?: No Fatigue?: Yes  Skin Skin rash/lesions?: No Itching?: No  Eyes Blurred vision?: No Double vision?: No  Ears/Nose/Throat Sore throat?: No Sinus problems?: No  Hematologic/Lymphatic Swollen glands?: No Easy bruising?: No  Cardiovascular Leg swelling?: No Chest pain?: No  Respiratory Cough?: No Shortness of breath?: No  Endocrine Excessive thirst?: No  Musculoskeletal Back pain?: Yes Joint pain?: Yes  Neurological Headaches?: No Dizziness?: No  Psychologic Depression?: No Anxiety?: No  Physical Exam: BP (!) 146/73   Pulse 80   Ht 6' (1.829 m)   Wt 200 lb (90.7 kg)   BMI 27.12 kg/m  Constitutional:  Alert and oriented, No acute distress. HEENT: [place] AT, moist mucus membranes.  Trachea midline, no masses. Cardiovascular: No clubbing, cyanosis, or edema. Respiratory: Normal respiratory effort, no increased work of breathing. GI: Abdomen is soft, nontender, nondistended, no abdominal masses GU: Mild left CVA tenderness.  Skin: No rashes, bruises or suspicious lesions. Neurologic: Grossly intact, no focal deficits, moving all 4 extremities. Psychiatric: Normal mood and affect.  Laboratory Data: Lab Results  Component Value Date   WBC 7.8 10/19/2016   HGB 14.4 10/19/2016   HCT 40.6 10/19/2016   MCV 87.5 10/19/2016   PLT 188 10/19/2016    Lab Results  Component Value Date   CREATININE 1.26 04/08/2017    Lab Results  Component Value Date   TESTOSTERONE 244 (L) 09/05/2015    Lab Results  Component Value Date   HGBA1C 6.2 06/17/2017    Urinalysis    Component Value Date/Time   COLORURINE ORANGE (A) 10/19/2016 1922   APPEARANCEUR Cloudy (A) 10/28/2016  1428   LABSPEC 1.024 10/19/2016 1922   LABSPEC 1.037 04/13/2013 1423   PHURINE  10/19/2016 1922    TEST NOT REPORTED DUE TO COLOR INTERFERENCE OF URINE PIGMENT   GLUCOSEU 2+ (A) 10/28/2016 1428   GLUCOSEU >=500 04/13/2013 1423   HGBUR (A) 10/19/2016 1922    TEST NOT REPORTED DUE TO COLOR INTERFERENCE OF URINE PIGMENT   BILIRUBINUR Negative 10/28/2016 1428   BILIRUBINUR Negative 04/13/2013 1423   KETONESUR (A) 10/19/2016 1922    TEST NOT REPORTED DUE TO COLOR INTERFERENCE OF URINE PIGMENT   PROTEINUR 3+ (A) 10/28/2016 1428   PROTEINUR (A) 10/19/2016 1922    TEST NOT REPORTED DUE TO COLOR INTERFERENCE OF URINE PIGMENT   UROBILINOGEN 0.2 09/05/2015 1139   NITRITE Negative 10/28/2016 1428   NITRITE (A) 10/19/2016 1922    TEST NOT REPORTED DUE TO COLOR INTERFERENCE OF URINE PIGMENT   LEUKOCYTESUR Negative 10/28/2016 1428   LEUKOCYTESUR Negative 04/13/2013 1423    Pertinent Imaging: CLINICAL DATA:  Left flank pain for 1 week  EXAM: CT ABDOMEN AND PELVIS WITHOUT CONTRAST  TECHNIQUE: Multidetector CT imaging of the abdomen and pelvis was performed following the standard protocol without IV contrast.  COMPARISON:  None.  FINDINGS: Lower chest: Lung bases are clear. No effusions. Heart is normal size.  Hepatobiliary: No focal hepatic abnormality. Gallbladder unremarkable.  Pancreas: No focal abnormality or ductal dilatation.  Spleen: No focal abnormality.  Normal size.  Adrenals/Urinary Tract: Atrophy of the left kidney. Two small distal left ureteral stones are noted, the largest measuring 5 mm in length. A second smaller stone is noted just proximal to the first stone. No hydronephrosis. No hydronephrosis on the right. Small punctate right upper pole renal stone on the right. Adrenal glands and urinary bladder unremarkable.  Stomach/Bowel: Appendix is normal. Stomach, large and small bowel grossly unremarkable.  Vascular/Lymphatic: No evidence of aneurysm or  adenopathy.  Reproductive: Mild prostate prominence with central calcifications.  Other: No free fluid or free air.  Musculoskeletal: No acute bony abnormality.  IMPRESSION: Two stones in the distal left ureter, the largest 5 mm in greatest diameter. No hydronephrosis. The left kidney is atrophic with mild perinephric stranding.  Mild prostate prominence with central calcifications.  Punctate right upper pole nephrolithiasis.   Electronically Signed   By: Charlett Nose M.D.   On: 07/13/2017 10:18  CT personally reviewed and compared to previous scan from 10/2016  Assessment & Plan:    1. Recurrent nephrolithiasis Continue potassium citrate twice a day, 20 mEq Continue to encourage dietary changes - Urinalysis, Complete  2. Left ureteral stone Recurrent left distal ureteral stones which appear to be nonobstructive, 5 mm as well as a smaller 2-3 mm stone Minimal discomfort at this time I do suspect that he has  some degree of scar tissue in his left distal ureter given the this is where his stones tend to be retained. Options including continued medical expulsive therapy with Flomax, increase fluids, urinary strainer versus surgical intervention were discussed. He would like to continue medical expulsive therapy at this time. Warning symptoms are reviewed I have advised him to return in 2 weeks with a KUB. If he fails to pass the stone spontaneously, he will need intervention  Return in about 2 weeks (around 07/31/2017) for KUB.  Vanna Scotland, MD  [facility] [address], [address] [place], [place] [address] [phone]

## 2017-07-31 ENCOUNTER — Ambulatory Visit: Payer: 59 | Admitting: Urology

## 2017-08-14 ENCOUNTER — Ambulatory Visit: Payer: 59 | Admitting: Urology

## 2017-08-25 DIAGNOSIS — N201 Calculus of ureter: Secondary | ICD-10-CM | POA: Diagnosis not present

## 2017-09-02 DIAGNOSIS — R109 Unspecified abdominal pain: Secondary | ICD-10-CM | POA: Diagnosis not present

## 2017-09-02 DIAGNOSIS — N201 Calculus of ureter: Secondary | ICD-10-CM | POA: Diagnosis not present

## 2017-09-08 ENCOUNTER — Other Ambulatory Visit: Payer: Self-pay | Admitting: Urology

## 2017-09-09 ENCOUNTER — Other Ambulatory Visit: Payer: Self-pay | Admitting: Internal Medicine

## 2017-09-09 DIAGNOSIS — E785 Hyperlipidemia, unspecified: Secondary | ICD-10-CM

## 2017-09-11 ENCOUNTER — Encounter (HOSPITAL_BASED_OUTPATIENT_CLINIC_OR_DEPARTMENT_OTHER): Payer: Self-pay | Admitting: *Deleted

## 2017-09-11 NOTE — Progress Notes (Signed)
NPO AFTER MN W/ EXCEPTION CLEAR LIQUIDS UNTIL 0800 (NO CREAM / MILK PRODUCTS).  ARRIVE AT 1200.  NEEDS ISTAT 8 AND EKG.  WILL TAKE POTASSIUM CITRATE AND GABAPENTIN AM DOS W/ SIPS OF WATER.

## 2017-09-17 NOTE — H&P (Signed)
CC: I have kidney stones.  HPI: Brian Butler is a 51 year-old male established patient who is here for renal calculi.  The problem is on the left side. He first stated noticing pain on approximately 07/14/2017. This is not his first kidney stone. He is currently having back pain. He denies having flank pain, groin pain, nausea, vomiting, fever, and chills. He has not caught a stone in his urine strainer since his symptoms began.   He has had eswl, ureteral stent, and ureteroscopy for treatment of his stones in the past.   08/25/17: Patient has CT scan on 07/13/2017. He was previously followed in [place]. By Dr. Apolinar Junes. That showed 2 distal left ureteral calculi with an atrophic left kidney. There are no left renal calculi. There is a punctate right renal calculus. He has been straining his urine and has not noticed a stone pass. He still has occasional left-sided discomfort but is not severe. He does not wish to go to [place] anymore.   09/02/17:  The patient underwent a CT scan today which unfortunately revealed the 2 distal ureteral calculi are still present. He has minimal pain. He denies any fevers chills nausea vomiting. The left kidney is atrophic.   ALLERGIES: Cymbalta   MEDICATIONS: Gabapentin 300 mg capsule 1 capsule PO Daily  Lisinopril 10 mg tablet 1 tablet PO Daily  Nortriptyline Hcl  Potassium Citrate Er 10 meq (1,080 mg) tablet, extended release 1 tablet PO Daily  Tamsulosin Hcl 0.4 mg capsule, ext release 24 hr 1 capsule PO Daily  Tradjenta 5 mg tablet 1 tablet PO Daily    GU PSH: None     PSH Notes: kidney stone removal x4  spinal surgery    NON-GU PSH: None   GU PMH: Ureteral calculus - 08/25/2017   NON-GU PMH: Anxiety Arthritis Diabetes Type 2 Hypertension   FAMILY HISTORY: Kidney Stones - Runs in Family   SOCIAL HISTORY: Marital Status: Married Preferred Language: English; Race: White Current Smoking Status: Patient has never smoked.   Tobacco Use  Assessment Completed:  Used Tobacco in last 30 days?  Does not drink caffeine.   REVIEW OF SYSTEMS:    GU Review Male:   Patient reports frequent urination and get up at night to urinate. Patient denies hard to postpone urination, burning/ pain with urination, leakage of urine, stream starts and stops, trouble starting your stream, have to strain to urinate , erection problems, and penile pain.  Gastrointestinal (Upper):   Patient denies nausea, vomiting, and indigestion/ heartburn.  Gastrointestinal (Lower):   Patient denies diarrhea and constipation.  Constitutional:   Patient reports fatigue. Patient denies fever, night sweats, and weight loss.  Skin:   Patient denies skin rash/ lesion and itching.  Eyes:   Patient denies blurred vision and double vision.  Ears/ Nose/ Throat:   Patient denies sore throat and sinus problems.  Hematologic/Lymphatic:   Patient denies swollen glands and easy bruising.  Cardiovascular:   Patient denies leg swelling and chest pains.  Respiratory:   Patient denies cough and shortness of breath.  Endocrine:   Patient denies excessive thirst.  Musculoskeletal:   Patient reports back pain and joint pain.   Neurological:   Patient denies headaches and dizziness.  Psychologic:   Patient reports anxiety. Patient denies depression.   VITAL SIGNS:      09/02/2017 03:59 PM  Weight 210 lb / 95.25 kg  Height 70 in / 177.8 cm  BP 128/71 mmHg  Pulse 65 /min  BMI  30.1 kg/m   MULTI-SYSTEM PHYSICAL EXAMINATION:    Constitutional: Well-nourished. No physical deformities. Normally developed. Good grooming.  Respiratory: No labored breathing, no use of accessory muscles.   Cardiovascular: Normal temperature, adequate perfusion  Skin: No paleness, no jaundice,  Neurologic / Psychiatric: Oriented to time, oriented to place, oriented to person. No depression, no anxiety, no agitation.  Gastrointestinal: No mass, no tenderness, no rigidity, non obese abdomen. No CVA  tenderness  Eyes: Normal conjunctivae. Normal eyelids.  Musculoskeletal: Normal gait and station of head and neck.    PAST DATA REVIEWED:  Source Of History:  Patient   PROCEDURES:         C.T. Urogram - [id number]  There are 2 distal ureteral calculi, essentially unchanged from prior exam. Left kidney is atrophic.              Urinalysis Dipstick Dipstick Cont'd  Color: Yellow Bilirubin: Neg  Appearance: Clear Ketones: Neg  Specific Gravity: 1.020 Blood: Neg  pH: <=5.0 Protein: Neg  Glucose: Neg Urobilinogen: 0.2    Nitrites: Neg    Leukocyte Esterase: Neg   ASSESSMENT:      ICD-10 Details  1 GU:   Ureteral calculus - N20.1    PLAN:          Document Letter(s):  Created for Patient: Clinical Summary        Notes:   We discussed the management of urinary stones. These options include observation, ureteroscopy, and shockwave lithotripsy. We discussed which options are relevant to these particular stones. We discussed the natural history of stones as well as the complications of untreated stones and the impact on quality of life without treatment as well as with each of the above listed treatments. We also discussed the efficacy of each treatment in its ability to clear the stone burden. With any of these management options I discussed the signs and symptoms of infection and the need for emergent treatment should these be experienced. For each option we discussed the ability of each procedure to clear the patient of their stone burden.   For observation I described the risks which include but are not limited to silent renal damage, life-threatening infection, need for emergent surgery, failure to pass stone, and pain. I recommended against observation given that the stones have been present for quite some time.   For ureteroscopy I described the risks which include heart attack, stroke, pulmonary embolus, death, bleeding, infection, damage to contiguous structures, positioning injury,  ureteral stricture, ureteral avulsion, ureteral injury, need for ureteral stent, inability to perform ureteroscopy, need for an interval procedure, inability to clear stone burden, stent discomfort and pain.   For shockwave lithotripsy I described the risks which include arrhythmia, kidney contusion, kidney hemorrhage, need for transfusion, pain, inability to break up stone, inability to pass stone fragments, Steinstrasse, infection associated with obstructing stones, need for different surgical procedure, need for repeat shockwave lithotripsy, and death. I do not really see the stones on KUB so this is not a good option.   The patient would like to proceed with ureteroscopy with laser lithotripsy and ureteral stent placement.   Given the atrophic left kidney, we can obtain a Lasix renal scan after clearance of stones to see what, kidney function he has on the left   Signed by Modena SlaterEugene Asusena Sigley, III, M.D. on 09/02/17 at 4:48 PM (EDT

## 2017-09-17 NOTE — Progress Notes (Signed)
lov dr deterding  nephrology8-13-18 on chart

## 2017-09-18 ENCOUNTER — Ambulatory Visit (HOSPITAL_BASED_OUTPATIENT_CLINIC_OR_DEPARTMENT_OTHER): Payer: 59 | Admitting: Anesthesiology

## 2017-09-18 ENCOUNTER — Ambulatory Visit (HOSPITAL_BASED_OUTPATIENT_CLINIC_OR_DEPARTMENT_OTHER)
Admission: RE | Admit: 2017-09-18 | Discharge: 2017-09-18 | Disposition: A | Discharge status: Home / Self Care | Payer: 59 | Source: Ambulatory Visit | Attending: Urology | Admitting: Urology

## 2017-09-18 ENCOUNTER — Encounter (HOSPITAL_BASED_OUTPATIENT_CLINIC_OR_DEPARTMENT_OTHER): Payer: Self-pay | Admitting: *Deleted

## 2017-09-18 ENCOUNTER — Encounter (HOSPITAL_BASED_OUTPATIENT_CLINIC_OR_DEPARTMENT_OTHER)
Admission: RE | Disposition: A | Discharge status: Home / Self Care | Payer: Self-pay | Source: Ambulatory Visit | Attending: Urology

## 2017-09-18 DIAGNOSIS — F419 Anxiety disorder, unspecified: Secondary | ICD-10-CM | POA: Diagnosis not present

## 2017-09-18 DIAGNOSIS — N202 Calculus of kidney with calculus of ureter: Secondary | ICD-10-CM | POA: Diagnosis not present

## 2017-09-18 DIAGNOSIS — Z79899 Other long term (current) drug therapy: Secondary | ICD-10-CM | POA: Diagnosis not present

## 2017-09-18 DIAGNOSIS — I10 Essential (primary) hypertension: Secondary | ICD-10-CM | POA: Insufficient documentation

## 2017-09-18 DIAGNOSIS — M545 Low back pain: Secondary | ICD-10-CM | POA: Diagnosis not present

## 2017-09-18 DIAGNOSIS — N135 Crossing vessel and stricture of ureter without hydronephrosis: Secondary | ICD-10-CM | POA: Insufficient documentation

## 2017-09-18 DIAGNOSIS — E119 Type 2 diabetes mellitus without complications: Secondary | ICD-10-CM | POA: Diagnosis not present

## 2017-09-18 DIAGNOSIS — N201 Calculus of ureter: Secondary | ICD-10-CM | POA: Insufficient documentation

## 2017-09-18 DIAGNOSIS — G8929 Other chronic pain: Secondary | ICD-10-CM | POA: Diagnosis not present

## 2017-09-18 HISTORY — DX: Other chronic pain: G89.29

## 2017-09-18 HISTORY — DX: Dorsalgia, unspecified: M54.9

## 2017-09-18 HISTORY — PX: CYSTOSCOPY WITH RETROGRADE PYELOGRAM, URETEROSCOPY AND STENT PLACEMENT: SHX5789

## 2017-09-18 HISTORY — DX: Hyperlipidemia, unspecified: E78.5

## 2017-09-18 HISTORY — DX: Cervicalgia: M54.2

## 2017-09-18 HISTORY — DX: Calculus of ureter: N20.1

## 2017-09-18 HISTORY — DX: Polyneuropathy, unspecified: G62.9

## 2017-09-18 HISTORY — DX: Nocturia: R35.1

## 2017-09-18 HISTORY — DX: Type 2 diabetes mellitus without complications: E11.9

## 2017-09-18 HISTORY — DX: Unspecified osteoarthritis, unspecified site: M19.90

## 2017-09-18 HISTORY — PX: HOLMIUM LASER APPLICATION: SHX5852

## 2017-09-18 LAB — POCT I-STAT, CHEM 8
BUN: 23 mg/dL — AB (ref 6–20)
CHLORIDE: 101 mmol/L (ref 101–111)
Calcium, Ion: 1.27 mmol/L (ref 1.15–1.40)
Creatinine, Ser: 1.3 mg/dL — ABNORMAL HIGH (ref 0.61–1.24)
Glucose, Bld: 114 mg/dL — ABNORMAL HIGH (ref 65–99)
HEMATOCRIT: 43 % (ref 39.0–52.0)
Hemoglobin: 14.6 g/dL (ref 13.0–17.0)
POTASSIUM: 4.3 mmol/L (ref 3.5–5.1)
SODIUM: 138 mmol/L (ref 135–145)
TCO2: 28 mmol/L (ref 22–32)

## 2017-09-18 LAB — GLUCOSE, CAPILLARY: Glucose-Capillary: 114 mg/dL — ABNORMAL HIGH (ref 65–99)

## 2017-09-18 SURGERY — CYSTOURETEROSCOPY, WITH RETROGRADE PYELOGRAM AND STENT INSERTION
Anesthesia: General | Laterality: Left

## 2017-09-18 MED ORDER — PROPOFOL 500 MG/50ML IV EMUL
INTRAVENOUS | Status: AC
  Filled 2017-09-18: qty 50

## 2017-09-18 MED ORDER — SODIUM CHLORIDE 0.9 % IV SOLN
INTRAVENOUS | Status: DC
  Administered 2017-09-18: 12:00:00 via INTRAVENOUS
  Filled 2017-09-18: qty 1000

## 2017-09-18 MED ORDER — ONDANSETRON HCL 4 MG/2ML IJ SOLN
INTRAMUSCULAR | Status: DC | PRN
  Administered 2017-09-18: 4 mg via INTRAVENOUS

## 2017-09-18 MED ORDER — METOCLOPRAMIDE HCL 5 MG/ML IJ SOLN
INTRAMUSCULAR | Status: AC
  Filled 2017-09-18: qty 2

## 2017-09-18 MED ORDER — OXYCODONE HCL 5 MG PO TABS
ORAL_TABLET | ORAL | Status: AC
  Filled 2017-09-18: qty 1

## 2017-09-18 MED ORDER — CEFAZOLIN SODIUM-DEXTROSE 2-4 GM/100ML-% IV SOLN
2.0000 g | INTRAVENOUS | Status: AC
  Administered 2017-09-18: 2 g via INTRAVENOUS
  Filled 2017-09-18: qty 100

## 2017-09-18 MED ORDER — METOCLOPRAMIDE HCL 5 MG/ML IJ SOLN
INTRAMUSCULAR | Status: DC | PRN
  Administered 2017-09-18: 10 mg via INTRAVENOUS

## 2017-09-18 MED ORDER — OXYCODONE HCL 5 MG/5ML PO SOLN
5.0000 mg | Freq: Once | ORAL | Status: AC | PRN
  Filled 2017-09-18: qty 5

## 2017-09-18 MED ORDER — DEXAMETHASONE SODIUM PHOSPHATE 10 MG/ML IJ SOLN
INTRAMUSCULAR | Status: AC
  Filled 2017-09-18: qty 1

## 2017-09-18 MED ORDER — FENTANYL CITRATE (PF) 100 MCG/2ML IJ SOLN
INTRAMUSCULAR | Status: DC | PRN
  Administered 2017-09-18: 50 ug via INTRAVENOUS

## 2017-09-18 MED ORDER — LIDOCAINE 2% (20 MG/ML) 5 ML SYRINGE
INTRAMUSCULAR | Status: DC | PRN
  Administered 2017-09-18: 60 mg via INTRAVENOUS
  Administered 2017-09-18: 40 mg via INTRAVENOUS

## 2017-09-18 MED ORDER — MIDAZOLAM HCL 5 MG/5ML IJ SOLN
INTRAMUSCULAR | Status: DC | PRN
  Administered 2017-09-18: 2 mg via INTRAVENOUS

## 2017-09-18 MED ORDER — MIDAZOLAM HCL 2 MG/2ML IJ SOLN
INTRAMUSCULAR | Status: AC
  Filled 2017-09-18: qty 2

## 2017-09-18 MED ORDER — SCOPOLAMINE 1 MG/3DAYS TD PT72
1.0000 | MEDICATED_PATCH | TRANSDERMAL | Status: DC
  Administered 2017-09-18: 1.5 mg via TRANSDERMAL
  Filled 2017-09-18: qty 1

## 2017-09-18 MED ORDER — ACETAMINOPHEN 10 MG/ML IV SOLN
INTRAVENOUS | Status: DC | PRN
  Administered 2017-09-18: 1000 mg via INTRAVENOUS

## 2017-09-18 MED ORDER — LIDOCAINE 2% (20 MG/ML) 5 ML SYRINGE
INTRAMUSCULAR | Status: AC
  Filled 2017-09-18: qty 5

## 2017-09-18 MED ORDER — FENTANYL CITRATE (PF) 100 MCG/2ML IJ SOLN
25.0000 ug | INTRAMUSCULAR | Status: DC | PRN
  Filled 2017-09-18: qty 1

## 2017-09-18 MED ORDER — ACETAMINOPHEN 10 MG/ML IV SOLN
INTRAVENOUS | Status: AC
  Filled 2017-09-18: qty 100

## 2017-09-18 MED ORDER — ONDANSETRON HCL 4 MG/2ML IJ SOLN
4.0000 mg | Freq: Once | INTRAMUSCULAR | Status: DC | PRN
Start: 2017-09-18 — End: 2017-09-18
  Filled 2017-09-18: qty 2

## 2017-09-18 MED ORDER — FENTANYL CITRATE (PF) 100 MCG/2ML IJ SOLN
INTRAMUSCULAR | Status: AC
  Filled 2017-09-18: qty 2

## 2017-09-18 MED ORDER — PROPOFOL 500 MG/50ML IV EMUL
INTRAVENOUS | Status: DC | PRN
  Administered 2017-09-18: 150 ug/kg/min via INTRAVENOUS

## 2017-09-18 MED ORDER — CEFAZOLIN SODIUM-DEXTROSE 2-4 GM/100ML-% IV SOLN
INTRAVENOUS | Status: AC
  Filled 2017-09-18: qty 100

## 2017-09-18 MED ORDER — SCOPOLAMINE 1 MG/3DAYS TD PT72
MEDICATED_PATCH | TRANSDERMAL | Status: AC
  Filled 2017-09-18: qty 1

## 2017-09-18 MED ORDER — OXYCODONE HCL 5 MG PO TABS
5.0000 mg | ORAL_TABLET | Freq: Once | ORAL | Status: AC | PRN
  Administered 2017-09-18: 5 mg via ORAL
  Filled 2017-09-18: qty 1

## 2017-09-18 MED ORDER — DEXAMETHASONE SODIUM PHOSPHATE 4 MG/ML IJ SOLN
INTRAMUSCULAR | Status: DC | PRN
  Administered 2017-09-18: 10 mg via INTRAVENOUS

## 2017-09-18 MED ORDER — PROPOFOL 10 MG/ML IV BOLUS
INTRAVENOUS | Status: DC | PRN
  Administered 2017-09-18: 200 mg via INTRAVENOUS

## 2017-09-18 MED ORDER — ONDANSETRON HCL 4 MG/2ML IJ SOLN
INTRAMUSCULAR | Status: AC
  Filled 2017-09-18: qty 2

## 2017-09-18 MED ORDER — PROPOFOL 10 MG/ML IV BOLUS
INTRAVENOUS | Status: AC
  Filled 2017-09-18: qty 40

## 2017-09-18 MED FILL — HYDROCODON-APAP 5-325: 5-325 | 2 days supply | Qty: 14 | Fill #0

## 2017-09-18 SURGICAL SUPPLY — 20 items
BAG DRAIN URO-CYSTO SKYTR STRL (DRAIN) ×3 IMPLANT
CATH INTERMIT  6FR 70CM (CATHETERS) IMPLANT
CLOTH BEACON ORANGE TIMEOUT ST (SAFETY) ×3 IMPLANT
FIBER LASER FLEXIVA 200 (UROLOGICAL SUPPLIES) ×3 IMPLANT
FIBER LASER FLEXIVA 365 (UROLOGICAL SUPPLIES) IMPLANT
FIBER LASER TRAC TIP (UROLOGICAL SUPPLIES) IMPLANT
GLOVE BIO SURGEON STRL SZ7.5 (GLOVE) ×3 IMPLANT
GOWN STRL REUS W/TWL XL LVL3 (GOWN DISPOSABLE) IMPLANT
GUIDEWIRE STR DUAL SENSOR (WIRE) ×3 IMPLANT
INFUSOR MANOMETER BAG 3000ML (MISCELLANEOUS) ×3 IMPLANT
IV NS 1000ML (IV SOLUTION) ×2
IV NS 1000ML BAXH (IV SOLUTION) ×1 IMPLANT
IV NS IRRIG 3000ML ARTHROMATIC (IV SOLUTION) ×6 IMPLANT
KIT RM TURNOVER CYSTO AR (KITS) ×3 IMPLANT
MANIFOLD NEPTUNE II (INSTRUMENTS) ×3 IMPLANT
NS IRRIG 500ML POUR BTL (IV SOLUTION) ×3 IMPLANT
PACK CYSTO (CUSTOM PROCEDURE TRAY) ×3 IMPLANT
STENT URET 6FRX26 CONTOUR (STENTS) ×3 IMPLANT
TUBE CONNECTING 12'X1/4 (SUCTIONS) ×1
TUBE CONNECTING 12X1/4 (SUCTIONS) ×2 IMPLANT

## 2017-09-18 NOTE — Anesthesia Postprocedure Evaluation (Signed)
Anesthesia Post Note  Patient: Enis SlipperLarry J Pitera  Procedure(s) Performed: CYSTOSCOPY WITH RETROGRADE PYELOGRAM, URETEROSCOPY AND STENT PLACEMENT (Left ) HOLMIUM LASER APPLICATION (Left )     Patient location during evaluation: PACU Anesthesia Type: General Level of consciousness: awake, awake and alert and oriented Pain management: pain level controlled Vital Signs Assessment: post-procedure vital signs reviewed and stable Respiratory status: spontaneous breathing, nonlabored ventilation and respiratory function stable Cardiovascular status: blood pressure returned to baseline Anesthetic complications: no    Last Vitals:  Vitals:   09/18/17 1159 09/18/17 1357  BP: (!) 149/77 (P) 126/67  Pulse: 65 (P) 77  Resp: 16 (P) 14  Temp: 36.8 C (P) 36.8 C  SpO2: 99% (P) 100%    Last Pain:  Vitals:   09/18/17 1159  TempSrc: Oral                 Jahmez Bily COKER

## 2017-09-18 NOTE — Anesthesia Procedure Notes (Signed)
Procedure Name: LMA Insertion Date/Time: 09/18/2017 1:04 PM Performed by: Noreene LarssonJOSLIN, DAVID Pre-anesthesia Checklist: Patient identified, Emergency Drugs available, Suction available and Patient being monitored Patient Re-evaluated:Patient Re-evaluated prior to induction Oxygen Delivery Method: Circle system utilized Preoxygenation: Pre-oxygenation with 100% oxygen Induction Type: IV induction Ventilation: Mask ventilation without difficulty LMA: LMA inserted LMA Size: 4.0 Number of attempts: 1 Airway Equipment and Method: Bite block Placement Confirmation: positive ETCO2 Tube secured with: Tape Dental Injury: Teeth and Oropharynx as per pre-operative assessment

## 2017-09-18 NOTE — Discharge Instructions (Addendum)
[facility] [phone] Post Ureteroscopy With or Without Stent Instructions   REMOVE YOUR STENT NEXT Thursday MORNING 09/24/17  Definitions:  Ureter: The duct that transports urine from the kidney to the bladder. Stent:   A plastic hollow tube that is placed into the ureter, from the kidney to the                 bladder to prevent the ureter from swelling shut.  GENERAL INSTRUCTIONS:  Despite the fact that no skin incisions were used, the area around the ureter and bladder is raw and irritated. The stent is a foreign body which will further irritate the bladder wall. This irritation is manifested by increased frequency of urination, both day and night, and by an increase in the urge to urinate. In some, the urge to urinate is present almost always. Sometimes the urge is strong enough that you may not be able to stop yourself from urinating. The only real cure is to remove the stent and then give time for the bladder wall to heal which can't be done until the danger of the ureter swelling shut has passed, which varies.  You may see some blood in your urine while the stent is in place and a few days afterwards. Do not be alarmed, even if the urine was clear for a while. Get off your feet and drink lots of fluids until clearing occurs. If you start to pass clots or don't improve, call us.  DIET: You may return to your normal diet immediately. Because of the raw surface of your bladder, alcohol, spicy foods, acid type foods and drinks with caffeine may cause irritation or frequency and should be used in moderation. To keep your urine flowing freely and to avoid constipation, drink plenty of fluids during the day ( 8-10 glasses ). Tip: Avoid cranberry juice because it is very acidic.  ACTIVITY: Your physical activity doesn't need to be restricted. However, if you are very active, you may see some blood in your urine. We suggest that you reduce your activity under these  circumstances until the bleeding has stopped.  BOWELS: It is important to keep your bowels regular during the postoperative period. Straining with bowel movements can cause bleeding. A bowel movement every other day is reasonable. Use a mild laxative if needed, such as Milk of Magnesia 2-3 tablespoons, or 2 Dulcolax tablets. Call if you continue to have problems. If you have been taking narcotics for pain, before, during or after your surgery, you may be constipated. Take a laxative if necessary.   MEDICATION: You should resume your pre-surgery medications unless told not to. You may take oxybutynin or flomax if prescribed for bladder spasms or discomfort from the stent Take pain medication as directed for pain refractory to conservative management  PROBLEMS YOU SHOULD REPORT TO US:  Fevers over 100.5 Fahrenheit.  Heavy bleeding, or clots ( See above notes about blood in urine ).  Inability to urinate.  Drug reactions ( hives, rash, nausea, vomiting, diarrhea ).  Severe burning or pain with urination that is not improving.    Post Anesthesia Home Care Instructions  Activity: Get plenty of rest for the remainder of the day. A responsible individual must stay with you for 24 hours following the procedure.  For the next 24 hours, DO NOT: -Drive a car -Advertising copywriterperate machinery -Drink alcoholic beverages -Take any medication unless instructed by your physician -Make any legal decisions or sign important papers.  Meals: Start with liquid  foods such as gelatin or soup. Progress to regular foods as tolerated. Avoid greasy, spicy, heavy foods. If nausea and/or vomiting occur, drink only clear liquids until the nausea and/or vomiting subsides. Call your physician if vomiting continues.  Special Instructions/Symptoms: Your throat may feel dry or sore from the anesthesia or the breathing tube placed in your throat during surgery. If this causes discomfort, gargle with warm salt water. The  discomfort should disappear within 24 hours.  If you had a scopolamine patch placed behind your ear for the management of post- operative nausea and/or vomiting:  1. The medication in the patch is effective for 72 hours, after which it should be removed.  Wrap patch in a tissue and discard in the trash. Wash hands thoroughly with soap and water. 2. You may remove the patch earlier than 72 hours if you experience unpleasant side effects which may include dry mouth, dizziness or visual disturbances. 3. Avoid touching the patch. Wash your hands with soap and water after contact with the patch.

## 2017-09-18 NOTE — Anesthesia Preprocedure Evaluation (Signed)
Anesthesia Evaluation  Patient identified by MRN, date of birth, ID band Patient awake    Reviewed: Allergy & Precautions, NPO status , Patient's Chart, lab work & pertinent test results  Airway Mallampati: II  TM Distance: >3 FB Neck ROM: Full    Dental  (+) Teeth Intact, Dental Advisory Given   Pulmonary    breath sounds clear to auscultation       Cardiovascular hypertension,  Rhythm:Regular Rate:Normal     Neuro/Psych    GI/Hepatic   Endo/Other  diabetes  Renal/GU      Musculoskeletal   Abdominal   Peds  Hematology   Anesthesia Other Findings   Reproductive/Obstetrics                             Anesthesia Physical Anesthesia Plan  ASA: II  Anesthesia Plan: General   Post-op Pain Management:    Induction: Intravenous  PONV Risk Score and Plan: 1 and Ondansetron and Dexamethasone  Airway Management Planned: LMA  Additional Equipment:   Intra-op Plan:   Post-operative Plan:   Informed Consent: I have reviewed the patients History and Physical, chart, labs and discussed the procedure including the risks, benefits and alternatives for the proposed anesthesia with the patient or authorized representative who has indicated his/her understanding and acceptance.   Dental advisory given  Plan Discussed with: CRNA and Anesthesiologist  Anesthesia Plan Comments:         Anesthesia Quick Evaluation

## 2017-09-18 NOTE — Op Note (Signed)
Operative Note  Preoperative diagnosis:  1. LEFT URETERAL CALCULUS  Postoperative diagnosis: 1. LEFT URETERAL CALCULUS 2. Left ureteral stricture <1cm  Procedure(s): 1. Cystoscopy, left ureteroscopy, laser ablation of ureteral stricture <1cm, laser lithotripsy of stone, ureteral stent placement  Surgeon: Modena SlaterEugene Bell, MD  Assistants:none  Anesthesia: general  Complications: none immediate  EBL: minimal  Specimens: 1. none  Drains/Catheters: 1. 6x24JJ stent  Intraoperative findings:  1. Normal urethra and bladder 2. The distal ureter had a short less than 1 cm stricture that was able to be passed with the semirigid ureteroscope carefully. Proximal to this were to embedded stones within the ureter with only the very tips protruding from the ureteral wall. It appeared that there was reepithelialization over the stone.  Indication: 51 year old male with a long history of nephrolithiasis who has undergone multiple procedures in the past. He has an atrophic left kidney. He was found to have persistent left ureteral calculi on CT scan. He presents for the aformentioned operation.  Description of procedure:  The patient was identified and consent was obtained. He was taken back to the operating room and placed in the supine position. He was placed under general anesthesia. He was converted to dorsal lithotomy. He was prepped and draped in the standard sterile fashion. A timeout was performed.  A 22 French rigid cystoscope was advanced into the urethra and into the bladder. Cystoscopy revealed no abnormal findings. The left ureter was cannulated with a sensor wire which was advanced to the kidney under fluoroscopic guidance. Semirigid ureteroscope was advanced alongside the wire. A few centimeters within the ureter a ureteral stricture was encountered. This was a will be carefully navigated by with the scope and behind this the ureter was wide open. I inspected the ureter all the way up  to the ureteropelvic junction and no intraluminal calculi were seen. While removing the scope, just proximal to the stricture there were 2 small stones embedded within the ureteral wall. I used a laser fiber to perform lithotripsy carefully of the superficial portion of the stone. I was not overly aggressive given that he was not obstructed and the fact that these were embedded within the wall. I was sure not to cause a ureteral injury. I also used a laser fiber to laser ablate the stricture and the lumen was wide open after this. There were no other stone fragments seen. The scope was withdrawn and the rigid cystoscope was readvanced into the bladder over a wire. A 6 x 26 double-J ureteral stent was placed in the standard fashion followed by removal of the wire. Fluoroscopy confirmed proximal placement and direct visualization confirmed a good coil within the bladder. A string was left on the stent. The bladder was drained and the scope withdrawn and this concluded the operation. Patient tolerated the procedure well with stable postoperatively.  Plan: The patient will remove his stent next Thursday. He'll return in 4-6 weeks for renal ultrasound. He will need to have a renal ultrasound at least every 6 months given the finding of a stricture. His kidney is atrophic on the left so I will consider getting a renal scan to determine function on that side.

## 2017-09-18 NOTE — Interval H&P Note (Signed)
History and Physical Interval Note:  09/18/2017 12:25 PM  Enis SlipperLarry J Nauman  has presented today for surgery, with the diagnosis of LEFT URETERAL CALCULI  The various methods of treatment have been discussed with the patient and family. After consideration of risks, benefits and other options for treatment, the patient has consented to  Procedure(s): CYSTOSCOPY WITH RETROGRADE PYELOGRAM, URETEROSCOPY AND STENT PLACEMENT (Left) HOLMIUM LASER APPLICATION (Left) as a surgical intervention .  The patient's history has been reviewed, patient examined, no change in status, stable for surgery.  I have reviewed the patient's chart and labs.  Questions were answered to the patient's satisfaction.     Ray ChurchEugene D Bell, III

## 2017-09-18 NOTE — Transfer of Care (Signed)
Last Vitals:  Vitals:   09/18/17 1159  BP: (!) 149/77  Pulse: 65  Resp: 16  Temp: 36.8 C  SpO2: 99%    Last Pain:  Vitals:   09/18/17 1159  TempSrc: Oral         Immediate Anesthesia Transfer of Care Note  Patient: Brian Butler  Procedure(s) Performed: Procedure(s) (LRB): CYSTOSCOPY WITH RETROGRADE PYELOGRAM, URETEROSCOPY AND STENT PLACEMENT (Left) HOLMIUM LASER APPLICATION (Left)  Patient Location: PACU  Anesthesia Type: General  Level of Consciousness: awake, alert  and oriented  Airway & Oxygen Therapy: Patient Spontanous Breathing and Patient connected tooxygen  Post-op Assessment: Report given to PACU RN and Post -op Vital signs reviewed and stable  Post vital signs: Reviewed and stable  Complications: No apparent anesthesia complications

## 2017-09-21 ENCOUNTER — Encounter (HOSPITAL_BASED_OUTPATIENT_CLINIC_OR_DEPARTMENT_OTHER): Payer: Self-pay | Admitting: Urology

## 2017-10-14 DIAGNOSIS — N200 Calculus of kidney: Secondary | ICD-10-CM | POA: Diagnosis not present

## 2017-11-26 DIAGNOSIS — N182 Chronic kidney disease, stage 2 (mild): Secondary | ICD-10-CM | POA: Diagnosis not present

## 2017-11-26 DIAGNOSIS — Z0000 Encounter for general adult medical examination without abnormal findings: Secondary | ICD-10-CM | POA: Diagnosis not present

## 2017-11-26 DIAGNOSIS — M159 Polyosteoarthritis, unspecified: Secondary | ICD-10-CM | POA: Diagnosis not present

## 2017-11-26 DIAGNOSIS — E1140 Type 2 diabetes mellitus with diabetic neuropathy, unspecified: Secondary | ICD-10-CM | POA: Diagnosis not present

## 2017-11-26 DIAGNOSIS — R972 Elevated prostate specific antigen [PSA]: Secondary | ICD-10-CM | POA: Diagnosis not present

## 2017-11-26 DIAGNOSIS — I10 Essential (primary) hypertension: Secondary | ICD-10-CM | POA: Diagnosis not present

## 2017-12-18 ENCOUNTER — Encounter: Payer: Self-pay | Admitting: Endocrinology

## 2017-12-18 ENCOUNTER — Ambulatory Visit: Payer: 59 | Admitting: Endocrinology

## 2017-12-18 VITALS — BP 178/84 | HR 74 | Wt 221.6 lb

## 2017-12-18 DIAGNOSIS — E1142 Type 2 diabetes mellitus with diabetic polyneuropathy: Secondary | ICD-10-CM

## 2017-12-18 LAB — POCT GLYCOSYLATED HEMOGLOBIN (HGB A1C): Hemoglobin A1C: 6.6

## 2017-12-18 NOTE — Progress Notes (Signed)
   Subjective:    Patient ID: Brian Butler, male    DOB: Jan 23, 1966, 52 y.o.   MRN: [id number]  HPI Pt returns for f/u of diabetes mellitus: DM type: 2 Dx'ed: 2016 Complications: polyneuropathy and renal insufficiency Therapy: tradjenta DKA: never Severe hypoglycemia: never Pancreatitis: never Pancreatic imaging: normal on 2017 CT Other: he has never been on insulin; rx options are limited by renal insuff and edema.  Interval history: he has freestyle libre device, but di not bring today.  pt states he feels well in general.  He says cbg's are well-controlled   Review of Systems Denies leg edema    Objective:   Physical Exam VITAL SIGNS:  See vs page GENERAL: no distress Pulses: foot pulses are intact bilaterally.   MSK: no deformity of the feet or ankles.  CV: no edema of the legs or ankles Skin:  no ulcer on the feet or ankles.  normal color and temp on the feet and ankles.  Neuro: sensation is intact to touch on the feet and ankles, but decreased from normal.    A1c=6.6%  Lab Results  Component Value Date   CREATININE 1.30 (H) 09/18/2017   BUN 23 (H) 09/18/2017   NA 138 09/18/2017   K 4.3 09/18/2017   CL 101 09/18/2017   CO2 27 04/08/2017       Assessment & Plan:  Type 2 DM: well-controlled Renal insuff: this limits rx options  Patient Instructions  check your blood sugar twice a week.  vary the time of day when you check, between before the 3 meals, and at bedtime.  also check if you have symptoms of your blood sugar being too high or too low.  please keep a record of the readings and bring it to your next appointment here (or you can bring the meter itself).  You can write it on any piece of paper.  please call us sooner if your blood sugar goes below 70, or if you have a lot of readings over 200.   Please continue the same medication.  Please come back for a follow-up appointment in 4-6 months.

## 2017-12-18 NOTE — Patient Instructions (Addendum)
check your blood sugar twice a week.  vary the time of day when you check, between before the 3 meals, and at bedtime.  also check if you have symptoms of your blood sugar being too high or too low.  please keep a record of the readings and bring it to your next appointment here (or you can bring the meter itself).  You can write it on any piece of paper.  please call us sooner if your blood sugar goes below 70, or if you have a lot of readings over 200.   Please continue the same medication.  Please come back for a follow-up appointment in 4-6 months.

## 2018-01-05 ENCOUNTER — Encounter: Payer: 59 | Attending: Internal Medicine | Admitting: Dietician

## 2018-01-05 ENCOUNTER — Encounter: Payer: Self-pay | Admitting: Dietician

## 2018-01-05 VITALS — Ht 72.0 in | Wt 221.7 lb

## 2018-01-05 DIAGNOSIS — E1122 Type 2 diabetes mellitus with diabetic chronic kidney disease: Secondary | ICD-10-CM

## 2018-01-05 NOTE — Progress Notes (Signed)
Medical Nutrition Therapy: Visit start time: 0900  end time: 1030  Assessment:  Diagnosis: diabetes, CKD Stage 2 Medical history changes: no changes since previous MNT visit on 04/17/17 Psychosocial issues/ stress concerns: some depression, insomnia due to chronic pain   Current weight: 221.7lbs Height: 6'0" Medications, supplement changes: reconciled list in medical record.  Progress and evaluation: Patient reports frequent low BGs prior to starting use of Freestyle [place] monitor, now improved. He reports chronic sleep issues, and neuropathy, plantar fasciitis and arthritis pain. Some depression due to chronic pain. He is taking medication for pain and depression. He would like to lose weight to goal of 180lbs, and wants to slow or stop progression of kidney disease as well as control diabetes. He has been instructed to avoid orange juice due to acid (to prevent kidney stones), and also potatoes, milk for renal health.    Physical activity: none due to pain.   Dietary Intake:  Usual eating pattern includes 3 meals and 0-3 snacks per day (depending on BG). Dining out frequency: not assessed today.  Breakfast: small portion cereal Snack: none Lunch: light meal-- soup, sandwich, and/or salad Snack: candy or snack cake if BG drops low Supper: chicken or fish (no red meat) with vegetables Snack: none Beverages: water, Clear Frost flavored water (sugar free)  Nutrition Care Education: Topics covered: diabetes, renal disease, weight control Basic nutrition: basic food groups, appropriate nutrient balance  Weight control: benefits of weight control, energy needs for weight loss calculated at 1700kcal. Diabetes: Discussed treatment for and prevention of hypoglycemia; high fiber carbohydrate choices (limit whole grains if needed per nephrologist), importance of protein sources with each meal.  Renal disease:  importance of controlling BP, identifying high sodium foods, identifying food sources of  potassium, phosphorus (if restricted per nephrologist); preventing kidney stones by limiting high oxalate foods. Discussed milk alternatives, soaking potatoes to reduce potassium.    Nutritional Diagnosis:  [place]-2.2 Altered nutrition-related laboratory As related to diabetes, Stage II CKD.  As evidenced by HbA1C of 6.9, BG 225 on 11/26/17; GFR 54 on 11/26/17. -3.3 Overweight/obesity As related to medications, excess calories, limited activity.  As evidenced by BMI 30, patient report.  Intervention: Instruction as noted above.   Encouraged patient to discuss any needed diet restrictions for renal health with nephrologist, and reduce potassium and/or phosphorus intake only if needed.    Goal is to plan nutritionally balanced meals within diet restrictions to slow/ avoid progression of kidney disease and improve BG control.  Education Materials given:  . Food lists/ Planning A Balanced Meal . Sample meal pattern/ menus . Snacking handout . Goals/ instructions   Learner/ who was taught:  . Patient   Level of understanding: Marland Kitchen. Verbalizes/ demonstrates competency   Demonstrated degree of understanding via:   Teach back Learning barriers: . None  Willingness to learn/ readiness for change: . Eager, change in progress  Monitoring and Evaluation:  Dietary intake, exercise, renal health, BG control, and body weight      follow up: 02/02/18

## 2018-01-05 NOTE — Patient Instructions (Signed)
   Plan meals to include protein and controlled portions of carbohydrate foods.  Discuss kidney diet with nephrologist and whether strictly limiting potassium and phosphorus in necessary at this time.  Try almond milk with added protein as an alternative to dairy milk.  For low blood sugar, drink 4oz fruit juice, or chew 3-4 glucose tablets then follow up with meal or protein-containing snack within 30 minutes.

## 2018-01-07 ENCOUNTER — Other Ambulatory Visit: Payer: Self-pay | Admitting: Urology

## 2018-01-07 ENCOUNTER — Ambulatory Visit: Payer: 59 | Admitting: Endocrinology

## 2018-01-08 ENCOUNTER — Ambulatory Visit: Payer: 59 | Admitting: Endocrinology

## 2018-01-08 ENCOUNTER — Encounter: Payer: Self-pay | Admitting: Endocrinology

## 2018-01-08 VITALS — BP 140/86 | HR 63 | Resp 16 | Wt 222.0 lb

## 2018-01-08 DIAGNOSIS — N183 Chronic kidney disease, stage 3 (moderate): Secondary | ICD-10-CM | POA: Diagnosis not present

## 2018-01-08 DIAGNOSIS — E1142 Type 2 diabetes mellitus with diabetic polyneuropathy: Secondary | ICD-10-CM

## 2018-01-08 DIAGNOSIS — E1122 Type 2 diabetes mellitus with diabetic chronic kidney disease: Secondary | ICD-10-CM

## 2018-01-08 MED ORDER — LINAGLIPTIN 5 MG PO TABS: 2.5000 mg | ORAL_TABLET | Freq: Every day | ORAL | 11 refills | Status: DC

## 2018-01-08 NOTE — Patient Instructions (Addendum)
check your blood sugar twice a week.  vary the time of day when you check, between before the 3 meals, and at bedtime.  also check if you have symptoms of your blood sugar being too high or too low.  please keep a record of the readings and bring it to your next appointment here (or you can bring the meter itself).  You can write it on any piece of paper.  please call us sooner if your blood sugar goes below 70, or if you have a lot of readings over 200.   Please reduce the tradjenta to 1/2 pill per day.  Please come back for a follow-up appointment in 4-6 months.

## 2018-01-08 NOTE — Progress Notes (Signed)
Subjective:    Patient ID: Brian Butler, male    DOB: 1966/03/19, 52 y.o.   MRN: [id number]  HPI Pt returns for f/u of diabetes mellitus: DM type: 2 Dx'ed: 2016 Complications: polyneuropathy and renal insufficiency Therapy: tradjenta DKA: never Severe hypoglycemia: never Pancreatitis: never Pancreatic imaging: normal on 2017 CT.   Other: he has never been on insulin; rx options are limited by renal insuff and edema.  Interval history: he stopped using freestyle libre device   He says cbg varies from 70-233.  He feels 59 is too low for him  Past Medical History:  Diagnosis Date  . Arthritis   . Chronic neck and back pain   . CKD (chronic kidney disease), stage III [facility]) nephrologist-  dr deterding  . Hx of hypotestosteronemia 09/05/2015  . Hyperlipidemia   . Hypertension   . Left ureteral stone   . Nephrolithiasis    right non-obstructive per CT 08/ 2018 urology office  . Nocturia   . Peripheral neuropathy   . Plantar fasciitis, bilateral   . Type 2 diabetes mellitus (HCC)     Past Surgical History:  Procedure Laterality Date  . ANTERIOR CERVICAL DECOMP/DISCECTOMY FUSION  2006  approx.   C4 --5  . CARDIAC CATHETERIZATION N/A 04/23/2016   Procedure: Left Heart Cath and Coronary Angiography;  Surgeon: Lyn Records, MD;  Location: [facility] INVASIVE CV LAB;  Service: Cardiovascular;  Laterality: N/A;  abnormal nuclear study and precordial pain:  normal coronary arteries, normal LV size, funciton (ef 55-65%) and filling pressures (LVEDP is )  . CARDIOVASCULAR STRESS TEST  04-22-2016   dr Verdis Prime-- normal LV function and wall motion , ef 55-65%   Inermediate nuclear study w/ reversible small defect of mild severity present in the mid anterior location and concerning for a very small area of ischemia mid anterior wall;  partially reversible medium defect of mild severity in the basal anterosetpal, mid anteroseptal , and apical septal locasion that could be soft tissue  attenuation but also could be concerning for small area of ischemia  . CYSTOSCOPY W/ RETROGRADES Left 01/16/2016   Procedure: CYSTOSCOPY WITH RETROGRADE PYELOGRAM;  Surgeon: Hildred Laser, MD;  Location: [facility];  Service: Urology;  Laterality: Left;  . CYSTOSCOPY WITH RETROGRADE PYELOGRAM, URETEROSCOPY AND STENT PLACEMENT Left 09/18/2017   Procedure: CYSTOSCOPY WITH RETROGRADE PYELOGRAM, URETEROSCOPY AND STENT PLACEMENT;  Surgeon: Crista Elliot, MD;  [facility];  Service: Urology;  Laterality: Left;  . CYSTOSCOPY WITH STENT PLACEMENT Left 12/05/2015   Procedure: CYSTOSCOPY WITH STENT PLACEMENT;  Surgeon: Hildred Laser, MD;  Location: [facility];  Service: Urology;  Laterality: Left;  . CYSTOSCOPY WITH STENT PLACEMENT Left 01/16/2016   Procedure: CYSTOSCOPY WITH STENT PLACEMENT/ EXCHANGE;  Surgeon: Hildred Laser, MD;  Location: [facility];  Service: Urology;  Laterality: Left;  . CYSTOSCOPY/URETEROSCOPY/HOLMIUM LASER/STENT PLACEMENT Left 10/20/2016   Procedure: CYSTOSCOPY/URETEROSCOPY/HOLMIUM LASER/STENT PLACEMENT;  Surgeon: Vanna Scotland, MD;  Location: [facility];  Service: Urology;  Laterality: Left;  . EXTRACORPOREAL SHOCK WAVE LITHOTRIPSY Left 09/20/2015   Procedure: EXTRACORPOREAL SHOCK WAVE LITHOTRIPSY (ESWL);  Surgeon: Hildred Laser, MD;  Location: [facility];  Service: Urology;  Laterality: Left;  . EXTRACORPOREAL SHOCK WAVE LITHOTRIPSY Left 11/15/2015   Procedure: EXTRACORPOREAL SHOCK WAVE LITHOTRIPSY (ESWL);  Surgeon: Vanna Scotland, MD;  Location: [facility];  Service: Urology;  Laterality: Left;  . HOLMIUM LASER APPLICATION Left 09/18/2017   Procedure: HOLMIUM LASER APPLICATION;  Surgeon: Ray Church  III, MD;  Location: [facility];  Service: Urology;  Laterality: Left;  . URETEROSCOPY WITH HOLMIUM LASER LITHOTRIPSY Left 12/05/2015   Procedure: URETEROSCOPY WITH HOLMIUM LASER LITHOTRIPSY;  Surgeon: Hildred LaserBrian James Budzyn, MD;   Location: [facility];  Service: Urology;  Laterality: Left;  . URETEROSCOPY WITH HOLMIUM LASER LITHOTRIPSY Left 01/16/2016   Procedure: URETEROSCOPY WITH HOLMIUM LASER LITHOTRIPSY;  Surgeon: Hildred LaserBrian James Budzyn, MD;  Location: [facility];  Service: Urology;  Laterality: Left;    Social History   Socioeconomic History  . Marital status: Married    Spouse name: Not on file  . Number of children: Not on file  . Years of education: Not on file  . Highest education level: Not on file  Social Needs  . Financial resource strain: Not on file  . Food insecurity - worry: Not on file  . Food insecurity - inability: Not on file  . Transportation needs - medical: Not on file  . Transportation needs - non-medical: Not on file  Occupational History  . Not on file  Tobacco Use  . Smoking status: Never Smoker  . Smokeless tobacco: Never Used  Substance and Sexual Activity  . Alcohol use: No    Alcohol/week: 0.0 oz  . Drug use: No  . Sexual activity: Not Currently  Other Topics Concern  . Not on file  Social History Narrative  . Not on file    Current Outpatient Medications on File Prior to Visit  Medication Sig Dispense Refill  . aspirin EC 81 MG tablet Take 81 mg by mouth daily.    Marland Kitchen. atorvastatin (LIPITOR) 10 MG tablet Take 1 tablet (10 mg total) by mouth daily at 6 PM. (Patient taking differently: Take 10 mg by mouth every evening. ) 90 tablet 3  . Continuous Blood Gluc Receiver (FREESTYLE LIBRE 14 DAY READER) DEVI   0  . gabapentin (NEURONTIN) 300 MG capsule TAKE 1 CAPSULE BY MOUTH TWICE DAILY AND 2 TABLETS AT BEDTIME 360 capsule 1  . lisinopril (PRINIVIL,ZESTRIL) 20 MG tablet   3  . nortriptyline (PAMELOR) 25 MG capsule Take by mouth.    . potassium citrate (UROCIT-K) 10 MEQ (1080 MG) SR tablet Take 2 tablets (20 mEq total) by mouth 3 (three) times daily with meals. (Patient taking differently: Take 20 mEq by mouth every evening. ) 90 tablet 11  . traZODone (DESYREL) 50 MG tablet Take by  mouth.    . vitamin B-12 (CYANOCOBALAMIN) 1000 MCG tablet Take by mouth.     No current facility-administered medications on file prior to visit.     Allergies  Allergen Reactions  . Cymbalta [Duloxetine Hcl] Other (See Comments)    insomnia    Family History  Problem Relation Age of Onset  . Stroke Father   . Nephrolithiasis Father   . Diabetes type I Maternal Grandfather   . Prostate cancer Neg Hx   . Bladder Cancer Neg Hx     BP 140/86 (BP Location: Left Arm, Patient Position: Sitting, Cuff Size: Normal)   Pulse 63   Resp 16   Wt 222 lb (100.7 kg)   SpO2 98%   BMI 30.11 kg/m    Review of Systems He denies hypoglycemia    Objective:   Physical Exam VITAL SIGNS:  See vs page GENERAL: no distress Pulses: dorsalis pedis intact bilat.   MSK: no deformity of the feet CV: no leg edema.  Skin:  no ulcer on the feet.  normal color and temp on  the feet. Neuro: sensation is intact to touch on the feet, but decreased from normal.    Lab Results  Component Value Date   HGBA1C 6.6 12/18/2017   Lab Results  Component Value Date   CREATININE 1.30 (H) 09/18/2017   BUN 23 (H) 09/18/2017   NA 138 09/18/2017   K 4.3 09/18/2017   CL 101 09/18/2017   CO2 27 04/08/2017       Assessment & Plan:  Type 2 DM: well-controlled Renal insuff: this limits rx options  Patient Instructions  check your blood sugar twice a week.  vary the time of day when you check, between before the 3 meals, and at bedtime.  also check if you have symptoms of your blood sugar being too high or too low.  please keep a record of the readings and bring it to your next appointment here (or you can bring the meter itself).  You can write it on any piece of paper.  please call us sooner if your blood sugar goes below 70, or if you have a lot of readings over 200.   Please reduce the tradjenta to 1/2 pill per day.  Please come back for a follow-up appointment in 4-6 months.

## 2018-01-11 ENCOUNTER — Other Ambulatory Visit: Payer: Self-pay | Admitting: Nephrology

## 2018-01-11 DIAGNOSIS — M159 Polyosteoarthritis, unspecified: Secondary | ICD-10-CM | POA: Diagnosis not present

## 2018-01-11 DIAGNOSIS — I10 Essential (primary) hypertension: Secondary | ICD-10-CM | POA: Diagnosis not present

## 2018-01-11 DIAGNOSIS — E538 Deficiency of other specified B group vitamins: Secondary | ICD-10-CM | POA: Diagnosis not present

## 2018-01-11 DIAGNOSIS — G8929 Other chronic pain: Secondary | ICD-10-CM | POA: Diagnosis not present

## 2018-01-11 DIAGNOSIS — R109 Unspecified abdominal pain: Secondary | ICD-10-CM

## 2018-01-11 DIAGNOSIS — E1140 Type 2 diabetes mellitus with diabetic neuropathy, unspecified: Secondary | ICD-10-CM | POA: Diagnosis not present

## 2018-01-11 DIAGNOSIS — N200 Calculus of kidney: Secondary | ICD-10-CM

## 2018-01-14 ENCOUNTER — Ambulatory Visit: Payer: 59 | Admitting: Podiatry

## 2018-01-14 ENCOUNTER — Encounter: Payer: Self-pay | Admitting: Podiatry

## 2018-01-14 ENCOUNTER — Ambulatory Visit (INDEPENDENT_AMBULATORY_CARE_PROVIDER_SITE_OTHER): Payer: 59

## 2018-01-14 DIAGNOSIS — R109 Unspecified abdominal pain: Secondary | ICD-10-CM | POA: Diagnosis not present

## 2018-01-14 DIAGNOSIS — M722 Plantar fascial fibromatosis: Secondary | ICD-10-CM | POA: Diagnosis not present

## 2018-01-14 NOTE — Progress Notes (Signed)
Subjective:   Patient ID: Brian SlipperLarry J Butler, male   DOB: 52 y.o.   MRN: [id number]   HPI Patient presents with severe heel pain right over left and states it has not gone away in the last couple years and has gotten worse and he cannot take any pain medication he cannot take anti-inflammatories and he also is having a lot of back problems and is due to go on disability   ROS      Objective:  Physical Exam  Neurovascular status intact with exquisite discomfort of the plantar fascia right over the left at the insertional point tendon into the calcaneus with negative Tinel sign noted currently but patient is extremely hypersensitive     Assessment:  Acute plantar fascia is right over left with possibility that neuropathy also enters into the equation     Plan:  Patient is currently taking gabapentin we will continue this and I discussed treatment options including shockwave therapy or open or endoscopic surgery.  There is [place] no long-term guarantees this will solve his problem and he wants to consult with his other physicians before deciding on this and at this point he is quite debilitated with his chronic plantar fascial symptomatology

## 2018-01-15 ENCOUNTER — Other Ambulatory Visit: Payer: 59

## 2018-01-18 ENCOUNTER — Ambulatory Visit
Admission: RE | Admit: 2018-01-18 | Discharge: 2018-01-18 | Disposition: A | Discharge status: Home / Self Care | Payer: 59 | Source: Ambulatory Visit | Attending: Nephrology | Admitting: Nephrology

## 2018-01-18 DIAGNOSIS — R109 Unspecified abdominal pain: Secondary | ICD-10-CM | POA: Diagnosis not present

## 2018-01-18 DIAGNOSIS — N200 Calculus of kidney: Secondary | ICD-10-CM

## 2018-01-21 ENCOUNTER — Encounter: Payer: Self-pay | Admitting: Student in an Organized Health Care Education/Training Program

## 2018-01-21 ENCOUNTER — Other Ambulatory Visit: Payer: Self-pay

## 2018-01-21 ENCOUNTER — Ambulatory Visit
Payer: 59 | Attending: Student in an Organized Health Care Education/Training Program | Admitting: Student in an Organized Health Care Education/Training Program

## 2018-01-21 VITALS — BP 143/71 | HR 62 | Temp 98.3°F | Resp 14 | Ht 72.0 in | Wt 210.0 lb

## 2018-01-21 DIAGNOSIS — M542 Cervicalgia: Secondary | ICD-10-CM | POA: Diagnosis not present

## 2018-01-21 DIAGNOSIS — E1122 Type 2 diabetes mellitus with diabetic chronic kidney disease: Secondary | ICD-10-CM | POA: Insufficient documentation

## 2018-01-21 DIAGNOSIS — M79672 Pain in left foot: Secondary | ICD-10-CM | POA: Diagnosis not present

## 2018-01-21 DIAGNOSIS — M79671 Pain in right foot: Secondary | ICD-10-CM | POA: Insufficient documentation

## 2018-01-21 DIAGNOSIS — Z888 Allergy status to other drugs, medicaments and biological substances status: Secondary | ICD-10-CM | POA: Diagnosis not present

## 2018-01-21 DIAGNOSIS — I129 Hypertensive chronic kidney disease with stage 1 through stage 4 chronic kidney disease, or unspecified chronic kidney disease: Secondary | ICD-10-CM | POA: Diagnosis not present

## 2018-01-21 DIAGNOSIS — I25110 Atherosclerotic heart disease of native coronary artery with unstable angina pectoris: Secondary | ICD-10-CM | POA: Diagnosis not present

## 2018-01-21 DIAGNOSIS — Z79899 Other long term (current) drug therapy: Secondary | ICD-10-CM | POA: Insufficient documentation

## 2018-01-21 DIAGNOSIS — M545 Low back pain: Secondary | ICD-10-CM | POA: Diagnosis present

## 2018-01-21 DIAGNOSIS — M546 Pain in thoracic spine: Secondary | ICD-10-CM | POA: Diagnosis not present

## 2018-01-21 DIAGNOSIS — Z981 Arthrodesis status: Secondary | ICD-10-CM | POA: Insufficient documentation

## 2018-01-21 DIAGNOSIS — E1142 Type 2 diabetes mellitus with diabetic polyneuropathy: Secondary | ICD-10-CM

## 2018-01-21 DIAGNOSIS — M7918 Myalgia, other site: Secondary | ICD-10-CM | POA: Diagnosis not present

## 2018-01-21 DIAGNOSIS — E785 Hyperlipidemia, unspecified: Secondary | ICD-10-CM | POA: Diagnosis not present

## 2018-01-21 DIAGNOSIS — M5030 Other cervical disc degeneration, unspecified cervical region: Secondary | ICD-10-CM | POA: Diagnosis not present

## 2018-01-21 DIAGNOSIS — Z7982 Long term (current) use of aspirin: Secondary | ICD-10-CM | POA: Insufficient documentation

## 2018-01-21 DIAGNOSIS — N183 Chronic kidney disease, stage 3 (moderate): Secondary | ICD-10-CM | POA: Diagnosis not present

## 2018-01-21 DIAGNOSIS — G894 Chronic pain syndrome: Secondary | ICD-10-CM | POA: Insufficient documentation

## 2018-01-21 DIAGNOSIS — Z9889 Other specified postprocedural states: Secondary | ICD-10-CM | POA: Insufficient documentation

## 2018-01-21 NOTE — Progress Notes (Signed)
Patient's Name: Brian Butler  MRN: [id number]  Referring Provider: Tracie Harrier, MD  DOB: May 17, 1966  PCP: Tracie Harrier, MD  DOS: 01/21/2018  Note by: Gillis Santa, MD  Service setting: Ambulatory outpatient  Specialty: Interventional Pain Management  Location: [facility] (AMB) Pain Management Facility  Visit type: Initial Patient Evaluation  Patient type: New Patient   Primary Reason(s) for Visit: Encounter for initial evaluation of one or more chronic problems (new to examiner) potentially causing chronic pain, and posing a threat to normal musculoskeletal function. (Level of risk: High) CC: Back Pain (lower); Foot Pain (bilateral); and Neck Pain  HPI  Brian Butler is a 52 y.o. year old, male patient, who comes today to see [place] for the first time for an initial evaluation of his chronic pain. He has Essential hypertension; Nephrolithiasis; Hx of hypotestosteronemia; Unstable angina ([place]); Abnormal nuclear cardiac imaging test; Hyperlipidemia associated with type 2 diabetes mellitus ([place]); Chronic low back pain; Overweight (BMI 25.0-29.9); Plantar fasciitis; Chronic heel pain; Biceps tendonitis on left; Pigmented skin lesion of uncertain nature; Neck muscle spasm; Diabetes ([place]); Degenerative disc disease, cervical; S/P cervical spinal fusion; Cervicalgia; and Chronic pain syndrome on their problem list. Today he comes in for evaluation of his Back Pain (lower); Foot Pain (bilateral); and Neck Pain  Pain Assessment: Location: Lower Back(neck, both feet) Radiating: both arms and shoulders Onset: More than a month ago Duration: Chronic pain, Neuropathic pain Quality: (surging) Severity: 8 /10 (self-reported pain score)  Note: Reported level is inconsistent with clinical observations.                         When using our objective Pain Scale, levels between 6 and 10/10 are said to belong in an emergency room, as it progressively worsens from a 6/10, described as severely limiting, requiring  emergency care not usually available at an outpatient pain management facility. At a 6/10 level, communication becomes difficult and requires great effort. Assistance to reach the emergency department may be required. Facial flushing and profuse sweating along with potentially dangerous increases in heart rate and blood pressure will be evident. Effect on ADL:   Timing: Constant Modifying factors: changing positions, elevations of feet  Onset and Duration: Present longer than 3 months Cause of pain: Work related accident or event Severity: Getting worse, NAS-11 at its worse: 9/10, NAS-11 at its best: 6/10, NAS-11 now: 9/10 and NAS-11 on the average: 7/10 Timing: Not influenced by the time of the day Aggravating Factors: Bending, Climbing, Intercourse (sex), Kneeling, Lifiting, Motion, Prolonged sitting, Prolonged standing, Squatting, Stooping , Twisting, Walking, Walking uphill, Walking downhill and Working Alleviating Factors: Cold packs, Lying down, Resting, Sitting, Sleeping, Using a brace and Warm showers or baths Associated Problems: Day-time cramps, Night-time cramps, Depression, Dizziness, Fatigue, Inability to concentrate, Numbness, Personality changes, Sadness, Spasms, Tingling, Weakness, Pain that wakes patient up and Pain that does not allow patient to sleep Quality of Pain: Agonizing, Disabling and Distressing Previous Examinations or Tests: X-rays Previous Treatments: Narcotic medications, Physical Therapy, Steroid treatments by mouth, Strengthening exercises and TENS  The patient comes into the clinics today for the first time for a chronic pain management evaluation.   52 year old male with a history of chronic neck and thoracic/mid scapular pain along with type 2 diabetes, hypertension, history of nephrolithiasis, coronary artery disease.  Patient complains of burning and tingling in his feet, ankles, legs.  He also endorses throbbing and aching pain in between his shoulder blades  as  well as in his neck and shoulder region.  Patient does have tremors of his left hand which he states are secondary to severe pain.  Patient attributes all of his pain symptoms to his previous occupation.  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  Of note patient did have a C4-C5 ACDF in 2006 in [place].  Patient does have chronic kidney disease stage III, he is followed by nephrologist.  Current medications include gabapentin 300 mg, 300 mg, 600 mg.  He states that the medications are somewhat effective but do result in sedation and he feels that he has gained some weight while being on this.  Patient is also on nortriptyline 25 mg.  Patient wants to avoid opioid medications.  He does not find these medications helpful.  Today I took the time to provide the patient with information regarding my pain practice. The patient was informed that my practice is divided into two sections: an interventional pain management section, as well as a completely separate and distinct medication management section. I explained that I have procedure days for my interventional therapies, and evaluation days for follow-ups and medication management. Because of the amount of documentation required during both, they are kept separated. This means that there is the possibility that he may be scheduled for a procedure on one day, and medication management the next. I have also informed him that because of staffing and facility limitations, I no longer take patients for medication management only. To illustrate the reasons for this, I gave the patient the example of surgeons, and how inappropriate it would be to refer a patient to his/her care, just to write for the post-surgical antibiotics on a surgery done by a different surgeon.   Because interventional pain management is my board-certified specialty, the patient was informed that joining my practice  means that they are open to any and all interventional therapies. I made it clear that this does not mean that they will be forced to have any procedures done. What this means is that I believe interventional therapies to be essential part of the diagnosis and proper management of chronic pain conditions. Therefore, patients not interested in these interventional alternatives will be better served under the care of a different practitioner.  The patient was also made aware of my Comprehensive Pain Management Safety Guidelines where by joining my practice, they limit all of their nerve blocks and joint injections to those done by our practice, for as long as we are retained to manage their care.   Historic Controlled Substance Pharmacotherapy Review  PMP and historical list of controlled substances: Hydrocodone 5 mg, quantity 14, last filled 09/18/2017.  Currently not on opioid therapy. Medications: The patient did not bring the medication(s) to the appointment, as requested in our "New Patient Package" Pharmacodynamics: Desired effects: Analgesia: The patient reports no benefit. Reported improvement in function: The patient reports being unable to accomplish basic ADLs. Clinically meaningful improvement in function (CMIF): Medication does not meet basic CMIF Perceived effectiveness: None Undesirable effects: Side-effects or Adverse reactions: Moderate Historical Monitoring: The patient  reports that he does not use drugs. List of all UDS Test(s): No results found for: MDMA, COCAINSCRNUR, [place], [place], CANNABQUANT, THCU, [place] List of other Serum/Urine Drug Screening Test(s):  No results found for: AMPHSCRSER, BARBSCRSER, BENZOSCRSER, COCAINSCRSER, COCAINSCRNUR, PCPSCRSER, PCPQUANT, THCSCRSER, THCU, CANNABQUANT, OPIATESCRSER, OXYSCRSER, PROPOXSCRSER, ETH Historical Background Evaluation: [place] PMP: Six (6)  year initial data search conducted.             [place] Department of public safety,  offender search: Editor, commissioning Information) Non-contributory Risk Assessment Profile: Aberrant behavior: None observed or detected today Risk factors for fatal opioid overdose: None identified today Fatal overdose hazard ratio (HR): Calculation deferred Non-fatal overdose hazard ratio (HR): Calculation deferred Risk of opioid abuse or dependence: 0.7-3.0% with doses ? 36 MME/day and 6.1-26% with doses ? 120 MME/day. Substance use disorder (SUD) risk level: Pending results of Medical Psychology Evaluation for SUD Opioid risk tool (ORT) (Total Score): 0 Opioid Risk Tool - 01/21/18 0824      Family History of Substance Abuse   Alcohol  Negative    Illegal Drugs  Negative    Rx Drugs  Negative      Personal History of Substance Abuse   Alcohol  Negative    Illegal Drugs  Negative    Rx Drugs  Negative      Age   Age between 68-45 years   No      History of Preadolescent Sexual Abuse   History of Preadolescent Sexual Abuse  Negative or Male      Psychological Disease   Psychological Disease  Negative    Depression  Negative      Total Score   Opioid Risk Tool Scoring  0    Opioid Risk Interpretation  Low Risk      ORT Scoring interpretation table:  Score <3 = Low Risk for SUD  Score between 4-7 = Moderate Risk for SUD  Score >8 = High Risk for Opioid Abuse   PHQ-2 Depression Scale:  Total score: 0  PHQ-2 Scoring interpretation table: (Score and probability of major depressive disorder)  Score 0 = No depression  Score 1 = 15.4% Probability  Score 2 = 21.1% Probability  Score 3 = 38.4% Probability  Score 4 = 45.5% Probability  Score 5 = 56.4% Probability  Score 6 = 78.6% Probability   PHQ-9 Depression Scale:  Total score: 0  PHQ-9 Scoring interpretation table:  Score 0-4 = No depression  Score 5-9 = Mild depression  Score 10-14 = Moderate depression  Score 15-19 = Moderately severe depression  Score 20-27 = Severe depression (2.4 times higher risk of SUD and 2.89 times  higher risk of overuse)   Pharmacologic Plan: Brian Butler did not have a "Drug Screening Test" done today. Brian Butler states being against the use of "opioid analgesics", as part of his treatment plan. Initial impression: The patient indicated having no interest, at this time.  And opioid medications  Meds   Current Outpatient Medications:  .  aspirin EC 81 MG tablet, Take 81 mg by mouth daily., Disp: , Rfl:  .  atorvastatin (LIPITOR) 10 MG tablet, Take 1 tablet (10 mg total) by mouth daily at 6 PM. (Patient taking differently: Take 10 mg by mouth every evening. ), Disp: 90 tablet, Rfl: 3 .  Continuous Blood Gluc Receiver (FREESTYLE LIBRE 14 DAY READER) DEVI, , Disp: , Rfl: 0 .  gabapentin (NEURONTIN) 300 MG capsule, TAKE 1 CAPSULE BY MOUTH TWICE DAILY AND 2 TABLETS AT BEDTIME, Disp: 360 capsule, Rfl: 1 .  linagliptin (TRADJENTA) 5 MG TABS tablet, Take 0.5 tablets (2.5 mg total) by mouth daily., Disp: 15 tablet, Rfl: 11 .  lisinopril (PRINIVIL,ZESTRIL) 20 MG tablet, , Disp: , Rfl: 3 .  nortriptyline (PAMELOR) 25 MG capsule, Take by mouth., Disp: , Rfl:  .  potassium citrate (  UROCIT-K) 10 MEQ (1080 MG) SR tablet, Take 2 tablets (20 mEq total) by mouth 3 (three) times daily with meals. (Patient taking differently: Take 20 mEq by mouth every evening. ), Disp: 90 tablet, Rfl: 11 .  traZODone (DESYREL) 50 MG tablet, Take by mouth., Disp: , Rfl:  .  vitamin B-12 (CYANOCOBALAMIN) 1000 MCG tablet, Take by mouth., Disp: , Rfl:   Imaging Review    Thoracic Imaging: Thoracic MR wo contrast:  Results for orders placed during the hospital encounter of 03/21/17  MR THORACIC SPINE WO CONTRAST   Narrative CLINICAL DATA:  Thoracic spine pain for 10 years, worsening. No known injury.  EXAM: MRI THORACIC SPINE WITHOUT CONTRAST  TECHNIQUE: Multiplanar, multisequence MR imaging of the thoracic spine was performed. No intravenous contrast was administered.  COMPARISON:  None.  FINDINGS: Alignment:   Normal.  Vertebrae: Height and signal are normal.  Cord:  Normal signal throughout.  Paraspinal and other soft tissues: Negative.  Disc levels:  A very small right paracentral protrusion is seen at T3-4. The central canal and foramina are widely patent at this level. Intervertebral disc spaces are otherwise unremarkable.  IMPRESSION: Very small right paracentral protrusion T3-4 without central canal or foraminal stenosis. Thoracic spine is otherwise unremarkable.   Electronically Signed   By: Inge Rise M.D.   On: 03/21/2017 10:24     Lumbosacral Imaging: Lumbar MR wo contrast:  Results for orders placed during the hospital encounter of 03/21/17  MR LUMBAR SPINE WO CONTRAST   Narrative CLINICAL DATA:  Thoracic and lumbar spine pain for 10 years, worsening. No known injury.  EXAM: MRI LUMBAR SPINE WITHOUT CONTRAST  TECHNIQUE: Multiplanar, multisequence MR imaging of the lumbar spine was performed. No intravenous contrast was administered.  COMPARISON:  CT abdomen pelvis 10/19/2016.  FINDINGS: Segmentation:  Standard.  Alignment:  Normal.  Vertebrae:  Height and signal are normal.  Conus medullaris: Extends to the T12-L1 level and appears normal.  Paraspinal and other soft tissues: The left kidney is atrophic as seen on the prior CT. Otherwise unremarkable.  Disc levels:  T12-L1:  Negative.  L1-2:  Negative.  L2-3:  Negative.  L3-4:  Mild facet degenerative change.  Otherwise negative.  L4-5: There is some ligamentum flavum thickening, mild facet degenerative change and a shallow disc bulge. The central spinal canal and neural foramina are open.  L5-S1: Very shallow disc bulge and mild facet degenerative disease. The central canal and foramina are widely patent.  IMPRESSION: Mild spondylosis L4-5 and L5-S1 as described above. The central canal and foramina are open at all levels.   Electronically Signed   By: Inge Rise M.D.   On:  03/21/2017 10:22     Complexity Note: Imaging results reviewed. Results shared with Brian Butler, using Layman's terms.                         ROS  Cardiovascular History: Heart trouble, Daily Aspirin intake, High blood pressure and Heart catheterization Pulmonary or Respiratory History: Snoring  Neurological History: Abnormal skin sensations (Peripheral Neuropathy) Review of Past Neurological Studies: No results found for this or any previous visit. Psychological-Psychiatric History: Anxiousness, Depressed and Difficulty sleeping and or falling asleep Gastrointestinal History: No reported gastrointestinal signs or symptoms such as vomiting or evacuating blood, reflux, heartburn, alternating episodes of diarrhea and constipation, inflamed or scarred liver, or pancreas or irrregular and/or infrequent bowel movements Genitourinary History: Kidney disease and Passing kidney stones Hematological History: No  reported hematological signs or symptoms such as prolonged bleeding, low or poor functioning platelets, bruising or bleeding easily, hereditary bleeding problems, low energy levels due to low hemoglobin or being anemic Endocrine History: High blood sugar requiring insulin (IDDM) Rheumatologic History: Joint aches and or swelling due to excess weight (Osteoarthritis) Musculoskeletal History: Negative for myasthenia gravis, muscular dystrophy, multiple sclerosis or malignant hyperthermia Work History: Out of work due to pain  Allergies  Brian Butler is allergic to cymbalta [duloxetine hcl].  Laboratory Chemistry  Inflammation Markers (CRP: Acute Phase) (ESR: Chronic Phase) Lab Results  Component Value Date   ESRSEDRATE 6 09/05/2015                         Rheumatology Markers Lab Results  Component Value Date   LABURIC 3.3 (L) 01/06/2017                Renal Function Markers Lab Results  Component Value Date   BUN 23 (H) 09/18/2017   CREATININE 1.30 (H) 09/18/2017   GFRAA 76  04/08/2017   GFRNONAA 66 04/08/2017                 Hepatic Function Markers Lab Results  Component Value Date   AST 18 09/23/2016   ALT 36 09/23/2016   ALBUMIN 4.2 09/23/2016   ALKPHOS 58 09/23/2016                 Electrolytes Lab Results  Component Value Date   NA 138 09/18/2017   K 4.3 09/18/2017   CL 101 09/18/2017   CALCIUM 9.2 04/08/2017   PHOS 2.4 (L) 06/16/2016                        Neuropathy Markers Lab Results  Component Value Date   HGBA1C 6.6 12/18/2017                 Bone Pathology Markers Lab Results  Component Value Date   TESTOSTERONE 244 (L) 09/05/2015                         Coagulation Parameters Lab Results  Component Value Date   INR 1.00 04/23/2016   LABPROT 13.4 04/23/2016   PLT 188 10/19/2016   DDIMER <0.27 04/21/2016                 Cardiovascular Markers Lab Results  Component Value Date   TROPONINI <0.03 04/22/2016   HGB 14.6 09/18/2017   HCT 43.0 09/18/2017                 CA Markers No results found for: CEA, CA125, LABCA2               Note: Lab results reviewed.  [place]  Drug: Brian Butler  reports that he does not use drugs. Alcohol:  reports that he does not drink alcohol. Tobacco:  reports that  has never smoked. he has never used smokeless tobacco. Medical:  has a past medical history of Arthritis, Chronic neck and back pain, CKD (chronic kidney disease), stage III ([place]) (nephrologist-  dr deterding), hypotestosteronemia (09/05/2015), Hyperlipidemia, Hypertension, Left ureteral stone, Nephrolithiasis, Nocturia, Peripheral neuropathy, Plantar fasciitis, bilateral, and Type 2 diabetes mellitus ([place]). Family: family history includes Diabetes type I in his maternal grandfather; Nephrolithiasis in his father; Stroke in his father.  Past Surgical History:  Procedure Laterality Date  . ANTERIOR CERVICAL DECOMP/DISCECTOMY  FUSION  2006  approx.   C4 --5  . CARDIAC CATHETERIZATION N/A 04/23/2016   Procedure: Left Heart Cath  and Coronary Angiography;  Surgeon: Belva Crome, MD;  Location: [facility];  Service: Cardiovascular;  Laterality: N/A;  abnormal nuclear study and precordial pain:  normal coronary arteries, normal LV size, funciton (ef 55-65%) and filling pressures (LVEDP is 21mHg)  . CARDIOVASCULAR STRESS TEST  04-22-2016   dr hDaneen Schick- normal LV function and wall motion , ef 55-65%   Inermediate nuclear study w/ reversible small defect of mild severity present in the mid anterior location and concerning for a very small area of ischemia mid anterior wall;  partially reversible medium defect of mild severity in the basal anterosetpal, mid anteroseptal , and apical septal locasion that could be soft tissue attenuation but also could be concerning for small area of ischemia  . CYSTOSCOPY W/ RETROGRADES Left 01/16/2016   Procedure: CYSTOSCOPY WITH RETROGRADE PYELOGRAM;  Surgeon: BNickie Retort MD;  Location: [facility];  Service: Urology;  Laterality: Left;  . CYSTOSCOPY WITH RETROGRADE PYELOGRAM, URETEROSCOPY AND STENT PLACEMENT Left 09/18/2017   Procedure: CYSTOSCOPY WITH RETROGRADE PYELOGRAM, URETEROSCOPY AND STENT PLACEMENT;  Surgeon: BLucas Mallow MD;  [facility]  Service: Urology;  Laterality: Left;  . CYSTOSCOPY WITH STENT PLACEMENT Left 12/05/2015   Procedure: CYSTOSCOPY WITH STENT PLACEMENT;  Surgeon: BNickie Retort MD;  Location: [facility];  Service: Urology;  Laterality: Left;  . CYSTOSCOPY WITH STENT PLACEMENT Left 01/16/2016   Procedure: CYSTOSCOPY WITH STENT PLACEMENT/ EXCHANGE;  Surgeon: BNickie Retort MD;  Location: [facility];  Service: Urology;  Laterality: Left;  . CYSTOSCOPY/URETEROSCOPY/HOLMIUM LASER/STENT PLACEMENT Left 10/20/2016   Procedure: CYSTOSCOPY/URETEROSCOPY/HOLMIUM LASER/STENT PLACEMENT;  Surgeon: AHollice Espy MD;  Location: [facility];  Service: Urology;  Laterality: Left;  . EXTRACORPOREAL SHOCK WAVE LITHOTRIPSY Left 09/20/2015    Procedure: EXTRACORPOREAL SHOCK WAVE LITHOTRIPSY (ESWL);  Surgeon: BNickie Retort MD;  Location: [facility];  Service: Urology;  Laterality: Left;  . EXTRACORPOREAL SHOCK WAVE LITHOTRIPSY Left 11/15/2015   Procedure: EXTRACORPOREAL SHOCK WAVE LITHOTRIPSY (ESWL);  Surgeon: AHollice Espy MD;  Location: [facility];  Service: Urology;  Laterality: Left;  . HOLMIUM LASER APPLICATION Left 110/17/5102  Procedure: HOLMIUM LASER APPLICATION;  Surgeon: BLucas Mallow MD;  [facility]  Service: Urology;  Laterality: Left;  . URETEROSCOPY WITH HOLMIUM LASER LITHOTRIPSY Left 12/05/2015   Procedure: URETEROSCOPY WITH HOLMIUM LASER LITHOTRIPSY;  Surgeon: BNickie Retort MD;  Location: [facility];  Service: Urology;  Laterality: Left;  . URETEROSCOPY WITH HOLMIUM LASER LITHOTRIPSY Left 01/16/2016   Procedure: URETEROSCOPY WITH HOLMIUM LASER LITHOTRIPSY;  Surgeon: BNickie Retort MD;  Location: [facility];  Service: Urology;  Laterality: Left;   Active Ambulatory Problems    Diagnosis Date Noted  . Essential hypertension 09/05/2015  . Nephrolithiasis 09/05/2015  . Hx of hypotestosteronemia 09/05/2015  . [place]   . Abnormal nuclear cardiac imaging test 04/23/2016  . Hyperlipidemia associated with type 2 diabetes mellitus ([place] 04/30/2016  . Chronic low back pain 04/30/2016  . Overweight (BMI 25.0-29.9) 04/30/2016  . Plantar fasciitis 05/30/2016  . Chronic heel pain 05/30/2016  . Biceps tendonitis on left 08/07/2016  . Pigmented skin lesion of uncertain nature 058/52/7782 . Neck muscle spasm 08/07/2016  . Diabetes ([place] 09/18/2016  . Degenerative disc disease, cervical 01/21/2018  . S/P cervical spinal fusion 01/21/2018  . Cervicalgia 01/21/2018  . Chronic  pain syndrome 01/21/2018   Resolved Ambulatory Problems    Diagnosis Date Noted  . FH: stroke 09/05/2015  . Hyperglycemia 09/05/2015  . Diabetes mellitus type 2, controlled, without complications  ([place]) 17/61/6073  . Chest pain 04/21/2016  . Pain in the chest   . Chest pain at rest    Past Medical History:  Diagnosis Date  . Arthritis   . Chronic neck and back pain   . CKD (chronic kidney disease), stage III [facility]) nephrologist-  dr deterding  . Hx of hypotestosteronemia 09/05/2015  . Hyperlipidemia   . Hypertension   . Left ureteral stone   . Nephrolithiasis   . Nocturia   . Peripheral neuropathy   . Plantar fasciitis, bilateral   . Type 2 diabetes mellitus ([place])    Constitutional Exam  General appearance: Well nourished, well developed, and well hydrated. In no apparent acute distress Vitals:   01/21/18 0816  BP: (!) 143/71  Pulse: 62  Resp: 14  Temp: 98.3 F (36.8 C)  TempSrc: Oral  SpO2: 97%  Weight: 210 lb (95.3 kg)  Height: 6' (1.829 m)   BMI Assessment: Estimated body mass index is 28.48 kg/m as calculated from the following:   Height as of this encounter: 6' (1.829 m).   Weight as of this encounter: 210 lb (95.3 kg).  BMI interpretation table: BMI level Category Range association with higher incidence of chronic pain  <18 kg/m2 Underweight   18.5-24.9 kg/m2 Ideal body weight   25-29.9 kg/m2 Overweight Increased incidence by 20%  30-34.9 kg/m2 Obese (Class I) Increased incidence by 68%  35-39.9 kg/m2 Severe obesity (Class II) Increased incidence by 136%  >40 kg/m2 Extreme obesity (Class III) Increased incidence by 254%   BMI Readings from Last 4 Encounters:  01/21/18 28.48 kg/m  01/08/18 30.11 kg/m  01/05/18 30.07 kg/m  12/18/17 30.05 kg/m   Wt Readings from Last 4 Encounters:  01/21/18 210 lb (95.3 kg)  01/08/18 222 lb (100.7 kg)  01/05/18 221 lb 11.2 oz (100.6 kg)  12/18/17 221 lb 9.6 oz (100.5 kg)  Psych/Mental status: Alert, oriented x 3 (person, place, & time)       Eyes: PERLA Respiratory: No evidence of acute respiratory distress  Cervical Spine Area Exam  Skin & Axial Inspection: Well healed scar from previous spine surgery  detected Alignment: Symmetrical Functional ROM: Decreased ROM, bilaterally Stability: No instability detected Muscle Tone/Strength: Functionally intact. No obvious neuro-muscular anomalies detected. Sensory (Neurological): Musculoskeletal pain pattern and neuropathic Palpation: Complains of area being tender to palpation Positive provocative maneuver for for cervical facet disease  Upper Extremity (UE) Exam    Side: Right upper extremity  Side: Left upper extremity  Skin & Extremity Inspection: Skin color, temperature, and hair growth are WNL. No peripheral edema or cyanosis. No masses, redness, swelling, asymmetry, or associated skin lesions. No contractures.  Skin & Extremity Inspection: Left upper extremity tremor noted  Functional ROM: Unrestricted ROM          Functional ROM: Decreased ROM for shoulder and elbow  Muscle Tone/Strength: Functionally intact. No obvious neuro-muscular anomalies detected.  Muscle Tone/Strength: Functionally intact. No obvious neuro-muscular anomalies detected.  Sensory (Neurological): Unimpaired          Sensory (Neurological): Movement-associated discomfort          Palpation: No palpable anomalies              Palpation: No palpable anomalies  Specialized Test(s): Deferred         Specialized Test(s): Deferred          Thoracic Spine Area Exam  Skin & Axial Inspection: No masses, redness, or swelling Alignment: Symmetrical Functional ROM: Decreased ROM Stability: No instability detected Muscle Tone/Strength: Functionally intact. No obvious neuro-muscular anomalies detected. Sensory (Neurological): Articular pain pattern Muscle strength & Tone: Complains of area being tender to palpation  Lumbar Spine Area Exam  Skin & Axial Inspection: No masses, redness, or swelling Alignment: Symmetrical Functional ROM: Decreased ROM, to the left Stability: No instability detected Muscle Tone/Strength: Functionally intact. No obvious neuro-muscular  anomalies detected. Sensory (Neurological): Unimpaired Palpation: Complains of area being tender to palpation Bilateral Fist Percussion Test Provocative Tests: Lumbar Hyperextension and rotation test: Positive bilaterally for facet joint pain. Lumbar Lateral bending test: evaluation deferred today       Patrick's Maneuver: evaluation deferred today                    Gait & Posture Assessment  Ambulation: Patient ambulates using a cane Gait: Limited. Using assistive device to ambulate Posture: Difficulty standing up straight, due to pain   Lower Extremity Exam    Side: Right lower extremity  Side: Left lower extremity  Skin & Extremity Inspection: Skin color, temperature, and hair growth are WNL. No peripheral edema or cyanosis. No masses, redness, swelling, asymmetry, or associated skin lesions. No contractures.  Skin & Extremity Inspection: Skin color, temperature, and hair growth are WNL. No peripheral edema or cyanosis. No masses, redness, swelling, asymmetry, or associated skin lesions. No contractures.  Functional ROM: Unrestricted ROM          Functional ROM: Unrestricted ROM          Muscle Tone/Strength: Functionally intact. No obvious neuro-muscular anomalies detected.  Muscle Tone/Strength: Functionally intact. No obvious neuro-muscular anomalies detected.  Sensory (Neurological): Unimpaired  Sensory (Neurological): Unimpaired  Palpation: No palpable anomalies  Palpation: No palpable anomalies   Assessment  Primary Diagnosis & Pertinent Problem List: The primary encounter diagnosis was Degenerative disc disease, cervical. Diagnoses of S/P cervical spinal fusion, Cervicalgia, Myofascial pain, Diabetic polyneuropathy associated with type 2 diabetes mellitus ([place]), and Chronic pain syndrome were also pertinent to this visit.  Visit Diagnosis (New problems to examiner): 1. Degenerative disc disease, cervical   2. S/P cervical spinal fusion   3. Cervicalgia   4. Myofascial pain    5. Diabetic polyneuropathy associated with type 2 diabetes mellitus ([place])   6. Chronic pain syndrome    General Recommendations: The pain condition that the patient suffers from is best treated with a multidisciplinary approach that involves an increase in physical activity to prevent de-conditioning and worsening of the pain cycle, as well as psychological counseling (formal and/or informal) to address the co-morbid psychological affects of pain. Treatment will often involve judicious use of pain medications and interventional procedures to decrease the pain, allowing the patient to participate in the physical activity that will ultimately produce long-lasting pain reductions. The goal of the multidisciplinary approach is to return the patient to a higher level of overall function and to restore their ability to perform activities of daily living.  52 year old male with a history of chronic neck and thoracic/mid scapular pain along with type 2 diabetes, hypertension, history of nephrolithiasis, coronary artery disease.  Patient complains of burning and tingling in his feet, ankles, legs.  He also endorses throbbing and aching pain in between his shoulder blades as well as  in his neck and shoulder region.  Patient does have tremors of his left hand which he states are secondary to severe pain.  Patient attributes all of his pain symptoms to his previous occupation.  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  Of note patient did have a C4-C5 ACDF in 2006 in [place].  Patient does have chronic kidney disease stage III, he is followed by nephrologist. Current medications include gabapentin 300 mg, 300 mg, 600 mg.  He states that the medications are somewhat effective but do result in sedation and he feels that he has gained some weight while being on this.  Patient is also on nortriptyline 25 mg.  Patient wants to avoid opioid  medications.  He does not find these medications helpful.  Patient has had a thoracic and lumbar MRI but has not had a cervical MRI status post his ACDF.  Given his cervicalgia and an upper extremity neuropathy, it would be helpful to obtain a cervical MRI without contrast given his chronic kidney disease.  I will also refer the patient to neurology to discuss the utility of nerve conduction studies/EMG studies.  Also recommended the patient try alpha lipoic acid for symptoms of neuropathy.  Plan: -MRI cervical spine without contrast given chronic kidney disease -Referral to neurology to discuss utility of EMG/NCV -Recommend alpha lipoic acid which the patient can obtain over-the-counter -Future considerations could include cervical facet medial branch nerve blocks for cervical spondylosis depending upon what cervical MRI shows. -Continue gabapentin and nortriptyline as currently prescribed.  Patient wants to avoid adding additional medications at this time.  Note: Please be advised that as per protocol, today's visit has been an evaluation only. We have not taken over the patient's controlled substance management.  Ordered Lab-work, Procedure(s), Referral(s), & Consult(s): Orders Placed This Encounter  Procedures  . MR CERVICAL SPINE WO CONTRAST  . Ambulatory referral to Neurology     Provider-requested follow-up: Return in about 4 weeks (around 02/18/2018) for After Imaging.  Future Appointments  Date Time Provider [place]  02/02/2018  9:00 AM Georgina Peer, RD [facility] None  02/18/2018  9:30 AM Gillis Santa, MD [facility] None  04/16/2018  8:00 AM Renato Shin, MD [facility] None    Primary Care Physician: Tracie Harrier, MD Location: [facility] Note by: Gillis Santa, M.D, Date: 01/21/2018; Time: 11:19 AM  Patient Instructions  1.  Recommend alpha lipoic acid which she can obtain over-the-counter.  Take 200 mg daily for your  neuropathy. 2.  We will obtain cervical MRI. 3.  Referral to neurology to discuss nerve conduction studies/EMG. 4.  Follow-up in 4 weeks.

## 2018-01-21 NOTE — Progress Notes (Signed)
Safety precautions to be maintained throughout the outpatient stay will include: orient to surroundings, keep bed in low position, maintain call bell within reach at all times, provide assistance with transfer out of bed and ambulation.  

## 2018-01-21 NOTE — Patient Instructions (Signed)
1.  Recommend alpha lipoic acid which she can obtain over-the-counter.  Take 200 mg daily for your neuropathy. 2.  We will obtain cervical MRI. 3.  Referral to neurology to discuss nerve conduction studies/EMG. 4.  Follow-up in 4 weeks.

## 2018-02-02 ENCOUNTER — Ambulatory Visit: Payer: 59 | Admitting: Dietician

## 2018-02-02 DIAGNOSIS — Z0000 Encounter for general adult medical examination without abnormal findings: Secondary | ICD-10-CM | POA: Diagnosis not present

## 2018-02-02 DIAGNOSIS — R109 Unspecified abdominal pain: Secondary | ICD-10-CM | POA: Diagnosis not present

## 2018-02-02 DIAGNOSIS — E1122 Type 2 diabetes mellitus with diabetic chronic kidney disease: Secondary | ICD-10-CM | POA: Diagnosis not present

## 2018-02-02 DIAGNOSIS — I129 Hypertensive chronic kidney disease with stage 1 through stage 4 chronic kidney disease, or unspecified chronic kidney disease: Secondary | ICD-10-CM | POA: Diagnosis not present

## 2018-02-02 DIAGNOSIS — N182 Chronic kidney disease, stage 2 (mild): Secondary | ICD-10-CM | POA: Diagnosis not present

## 2018-02-02 DIAGNOSIS — N261 Atrophy of kidney (terminal): Secondary | ICD-10-CM | POA: Diagnosis not present

## 2018-02-02 DIAGNOSIS — N200 Calculus of kidney: Secondary | ICD-10-CM | POA: Diagnosis not present

## 2018-02-02 DIAGNOSIS — E785 Hyperlipidemia, unspecified: Secondary | ICD-10-CM | POA: Diagnosis not present

## 2018-02-02 DIAGNOSIS — M722 Plantar fascial fibromatosis: Secondary | ICD-10-CM | POA: Diagnosis not present

## 2018-02-09 DIAGNOSIS — M722 Plantar fascial fibromatosis: Secondary | ICD-10-CM | POA: Diagnosis not present

## 2018-02-09 DIAGNOSIS — B9789 Other viral agents as the cause of diseases classified elsewhere: Secondary | ICD-10-CM | POA: Diagnosis not present

## 2018-02-09 DIAGNOSIS — J069 Acute upper respiratory infection, unspecified: Secondary | ICD-10-CM | POA: Diagnosis not present

## 2018-02-09 DIAGNOSIS — I10 Essential (primary) hypertension: Secondary | ICD-10-CM | POA: Diagnosis not present

## 2018-02-09 DIAGNOSIS — E1140 Type 2 diabetes mellitus with diabetic neuropathy, unspecified: Secondary | ICD-10-CM | POA: Diagnosis not present

## 2018-02-10 ENCOUNTER — Ambulatory Visit: Payer: 59 | Admitting: Dietician

## 2018-02-10 ENCOUNTER — Ambulatory Visit: Admission: RE | Admit: 2018-02-10 | Payer: 59 | Source: Ambulatory Visit

## 2018-02-16 ENCOUNTER — Other Ambulatory Visit: Payer: Self-pay

## 2018-02-16 ENCOUNTER — Emergency Department (HOSPITAL_COMMUNITY)
Admission: EM | Admit: 2018-02-16 | Discharge: 2018-02-16 | Disposition: A | Discharge status: Home / Self Care | Payer: 59 | Attending: Emergency Medicine | Admitting: Emergency Medicine

## 2018-02-16 ENCOUNTER — Encounter (HOSPITAL_COMMUNITY): Payer: Self-pay | Admitting: *Deleted

## 2018-02-16 DIAGNOSIS — R05 Cough: Secondary | ICD-10-CM | POA: Insufficient documentation

## 2018-02-16 DIAGNOSIS — M542 Cervicalgia: Secondary | ICD-10-CM | POA: Insufficient documentation

## 2018-02-16 DIAGNOSIS — E1140 Type 2 diabetes mellitus with diabetic neuropathy, unspecified: Secondary | ICD-10-CM | POA: Insufficient documentation

## 2018-02-16 DIAGNOSIS — Z7982 Long term (current) use of aspirin: Secondary | ICD-10-CM | POA: Insufficient documentation

## 2018-02-16 DIAGNOSIS — E1122 Type 2 diabetes mellitus with diabetic chronic kidney disease: Secondary | ICD-10-CM | POA: Diagnosis not present

## 2018-02-16 DIAGNOSIS — J322 Chronic ethmoidal sinusitis: Secondary | ICD-10-CM | POA: Diagnosis not present

## 2018-02-16 DIAGNOSIS — Z79899 Other long term (current) drug therapy: Secondary | ICD-10-CM | POA: Insufficient documentation

## 2018-02-16 DIAGNOSIS — Z7984 Long term (current) use of oral hypoglycemic drugs: Secondary | ICD-10-CM | POA: Insufficient documentation

## 2018-02-16 DIAGNOSIS — N183 Chronic kidney disease, stage 3 (moderate): Secondary | ICD-10-CM | POA: Diagnosis not present

## 2018-02-16 DIAGNOSIS — I129 Hypertensive chronic kidney disease with stage 1 through stage 4 chronic kidney disease, or unspecified chronic kidney disease: Secondary | ICD-10-CM | POA: Insufficient documentation

## 2018-02-16 DIAGNOSIS — J370 Chronic laryngitis: Secondary | ICD-10-CM | POA: Diagnosis not present

## 2018-02-16 DIAGNOSIS — R059 Cough, unspecified: Secondary | ICD-10-CM

## 2018-02-16 DIAGNOSIS — J320 Chronic maxillary sinusitis: Secondary | ICD-10-CM | POA: Diagnosis not present

## 2018-02-16 LAB — CBC WITH DIFFERENTIAL/PLATELET
Basophils Absolute: 0 10*3/uL (ref 0.0–0.1)
Basophils Relative: 0 %
Eosinophils Absolute: 0.2 10*3/uL (ref 0.0–0.7)
Eosinophils Relative: 2 %
HEMATOCRIT: 44.4 % (ref 39.0–52.0)
Hemoglobin: 15.1 g/dL (ref 13.0–17.0)
LYMPHS PCT: 18 %
Lymphs Abs: 2.5 10*3/uL (ref 0.7–4.0)
MCH: 30.6 pg (ref 26.0–34.0)
MCHC: 34 g/dL (ref 30.0–36.0)
MCV: 90.1 fL (ref 78.0–100.0)
MONO ABS: 0.8 10*3/uL (ref 0.1–1.0)
MONOS PCT: 6 %
NEUTROS ABS: 10 10*3/uL — AB (ref 1.7–7.7)
Neutrophils Relative %: 74 %
Platelets: 243 10*3/uL (ref 150–400)
RBC: 4.93 MIL/uL (ref 4.22–5.81)
RDW: 13.4 % (ref 11.5–15.5)
WBC: 13.4 10*3/uL — ABNORMAL HIGH (ref 4.0–10.5)

## 2018-02-16 LAB — COMPREHENSIVE METABOLIC PANEL
ALBUMIN: 3.8 g/dL (ref 3.5–5.0)
ALK PHOS: 90 U/L (ref 38–126)
ALT: 86 U/L — ABNORMAL HIGH (ref 17–63)
ANION GAP: 8 (ref 5–15)
AST: 33 U/L (ref 15–41)
BUN: 13 mg/dL (ref 6–20)
CO2: 27 mmol/L (ref 22–32)
Calcium: 9.2 mg/dL (ref 8.9–10.3)
Chloride: 102 mmol/L (ref 101–111)
Creatinine, Ser: 1.39 mg/dL — ABNORMAL HIGH (ref 0.61–1.24)
GFR calc Af Amer: >60 mL/min (ref >60)
GFR calc non Af Amer: 57 mL/min — ABNORMAL LOW (ref >60)
GLUCOSE: 186 mg/dL — AB (ref 65–99)
POTASSIUM: 4.5 mmol/L (ref 3.5–5.1)
Sodium: 137 mmol/L (ref 135–145)
Total Bilirubin: 1 mg/dL (ref 0.3–1.2)
Total Protein: 6.7 g/dL (ref 6.5–8.1)

## 2018-02-16 NOTE — ED Provider Notes (Signed)
[facility] EMERGENCY DEPARTMENT Provider Note   CSN: [id number] Arrival date & time: 02/16/18  1018     History   Chief Complaint Chief Complaint  Patient presents with  . Cough  . Headache    HPI Brian Butler is a 52 y.o. male.  HPI   Brian Butler is a 52 y.o. male, with a history of chronic neck and back pain, CKD stage III, HTN, and DM, presenting to the ED with productive cough with yellow sputum for about the past 7-8 days, accompanied by congestion.  Patient also states, "I do not know if I have had a fever, I have not measured my temperature, but every once in a while I feel hot and then it goes away."  Patient's wife had similar symptoms prior to his symptom onset. Patient was assessed by his PCP Monday, March 11, had negative flu swab, but was prescribed Tamiflu anyway.  Patient finished this course of medication, but states this did not improve symptoms.  On Friday, March 15, patient states he began to have pain in the back of the neck with coughing.  This pain is mild to moderate, sharp, radiating to the back of the head, worse on the right. Yesterday, he states his wife's PCP called in a Z-Pak for him.  He has taken one dose of this medication.  He has not received a chest x-ray. Patient states cough is his wife's PCP then told him today that he needed to go to the ED "to get an MRI and rule out meningitis."  He does indicate that he has left hand neuropathy at baseline.  Patient denies shortness of breath, N/V/D, chest pain, sore throat, neck stiffness, headache, acute neuro deficits, vision deficits, rash, confusion, seizures, or any other complaints.     Past Medical History:  Diagnosis Date  . Arthritis   . Chronic neck and back pain   . CKD (chronic kidney disease), stage III [facility]) nephrologist-  dr deterding  . Hx of hypotestosteronemia 09/05/2015  . Hyperlipidemia   . Hypertension   . Left ureteral stone   . Nephrolithiasis    right non-obstructive per CT 08/ 2018 urology office  . Nocturia   . Peripheral neuropathy   . Plantar fasciitis, bilateral   . Type 2 diabetes mellitus [facility])     Patient Active Problem List   Diagnosis Date Noted  . Degenerative disc disease, cervical 01/21/2018  . S/P cervical spinal fusion 01/21/2018  . Cervicalgia 01/21/2018  . Chronic pain syndrome 01/21/2018  . Diabetes (HCC) 09/18/2016  . Biceps tendonitis on left 08/07/2016  . Pigmented skin lesion of uncertain nature 08/07/2016  . Neck muscle spasm 08/07/2016  . Plantar fasciitis 05/30/2016  . Chronic heel pain 05/30/2016  . Hyperlipidemia associated with type 2 diabetes mellitus (HCC) 04/30/2016  . Chronic low back pain 04/30/2016  . Overweight (BMI 25.0-29.9) 04/30/2016  . Abnormal nuclear cardiac imaging test 04/23/2016  . Unstable angina (HCC)   . Essential hypertension 09/05/2015  . Nephrolithiasis 09/05/2015  . Hx of hypotestosteronemia 09/05/2015    Past Surgical History:  Procedure Laterality Date  . ANTERIOR CERVICAL DECOMP/DISCECTOMY FUSION  2006  approx.   C4 --5  . CARDIAC CATHETERIZATION N/A 04/23/2016   Procedure: Left Heart Cath and Coronary Angiography;  Surgeon: Lyn Records, MD;  Location: [facility] INVASIVE CV LAB;  Service: Cardiovascular;  Laterality: N/A;  abnormal nuclear study and precordial pain:  normal coronary arteries, normal LV size, funciton (ef  55-65%) and filling pressures (LVEDP is )  . CARDIOVASCULAR STRESS TEST  04-22-2016   dr Verdis Prime-- normal LV function and wall motion , ef 55-65%   Inermediate nuclear study w/ reversible small defect of mild severity present in the mid anterior location and concerning for a very small area of ischemia mid anterior wall;  partially reversible medium defect of mild severity in the basal anterosetpal, mid anteroseptal , and apical septal locasion that could be soft tissue attenuation but also could be concerning for small area of ischemia  .  CYSTOSCOPY W/ RETROGRADES Left 01/16/2016   Procedure: CYSTOSCOPY WITH RETROGRADE PYELOGRAM;  Surgeon: Hildred Laser, MD;  Location: [facility];  Service: Urology;  Laterality: Left;  . CYSTOSCOPY WITH RETROGRADE PYELOGRAM, URETEROSCOPY AND STENT PLACEMENT Left 09/18/2017   Procedure: CYSTOSCOPY WITH RETROGRADE PYELOGRAM, URETEROSCOPY AND STENT PLACEMENT;  Surgeon: Crista Elliot, MD;  Location: [facility];  Service: Urology;  Laterality: Left;  . CYSTOSCOPY WITH STENT PLACEMENT Left 12/05/2015   Procedure: CYSTOSCOPY WITH STENT PLACEMENT;  Surgeon: Hildred Laser, MD;  Location: [facility];  Service: Urology;  Laterality: Left;  . CYSTOSCOPY WITH STENT PLACEMENT Left 01/16/2016   Procedure: CYSTOSCOPY WITH STENT PLACEMENT/ EXCHANGE;  Surgeon: Hildred Laser, MD;  Location: [facility];  Service: Urology;  Laterality: Left;  . CYSTOSCOPY/URETEROSCOPY/HOLMIUM LASER/STENT PLACEMENT Left 10/20/2016   Procedure: CYSTOSCOPY/URETEROSCOPY/HOLMIUM LASER/STENT PLACEMENT;  Surgeon: Vanna Scotland, MD;  Location: [facility];  Service: Urology;  Laterality: Left;  . EXTRACORPOREAL SHOCK WAVE LITHOTRIPSY Left 09/20/2015   Procedure: EXTRACORPOREAL SHOCK WAVE LITHOTRIPSY (ESWL);  Surgeon: Hildred Laser, MD;  Location: [facility];  Service: Urology;  Laterality: Left;  . EXTRACORPOREAL SHOCK WAVE LITHOTRIPSY Left 11/15/2015   Procedure: EXTRACORPOREAL SHOCK WAVE LITHOTRIPSY (ESWL);  Surgeon: Vanna Scotland, MD;  Location: [facility];  Service: Urology;  Laterality: Left;  . HOLMIUM LASER APPLICATION Left 09/18/2017   Procedure: HOLMIUM LASER APPLICATION;  Surgeon: Crista Elliot, MD;  Location: [facility];  Service: Urology;  Laterality: Left;  . URETEROSCOPY WITH HOLMIUM LASER LITHOTRIPSY Left 12/05/2015   Procedure: URETEROSCOPY WITH HOLMIUM LASER LITHOTRIPSY;  Surgeon: Hildred Laser, MD;  Location: [facility];  Service: Urology;  Laterality: Left;  . URETEROSCOPY WITH  HOLMIUM LASER LITHOTRIPSY Left 01/16/2016   Procedure: URETEROSCOPY WITH HOLMIUM LASER LITHOTRIPSY;  Surgeon: Hildred Laser, MD;  Location: [facility];  Service: Urology;  Laterality: Left;       Home Medications    Prior to Admission medications   Medication Sig Start Date End Date Taking? Authorizing Provider  aspirin EC 81 MG tablet Take 81 mg by mouth daily.   Yes [provider]  atorvastatin (LIPITOR) 10 MG tablet Take 1 tablet (10 mg total) by mouth daily at 6 PM. Patient taking differently: Take 10 mg by mouth every evening.  08/07/16  Yes Reubin Milan, MD  gabapentin (NEURONTIN) 300 MG capsule TAKE 1 CAPSULE BY MOUTH TWICE DAILY AND 2 TABLETS AT BEDTIME Patient taking differently: TAKE 1 CAPSULE (300mg ) BY MOUTH TWICE DAILY AND 2 TABLETS (600mg ) AT BEDTIME 07/13/17  Yes Reubin Milan, MD  linagliptin (TRADJENTA) 5 MG TABS tablet Take 0.5 tablets (2.5 mg total) by mouth daily. Patient taking differently: Take 5 mg by mouth daily.  01/08/18  Yes Romero Belling, MD  lisinopril (PRINIVIL,ZESTRIL) 20 MG tablet Take 20 mg by mouth daily.  11/26/17  Yes [provider]  nortriptyline (PAMELOR) 25 MG capsule Take by  mouth. 02/23/17 02/23/18 Yes [provider]  potassium citrate (UROCIT-K) 10 MEQ (1080 MG) SR tablet Take 2 tablets (20 mEq total) by mouth 3 (three) times daily with meals. 01/06/17  Yes Vanna ScotlandBrandon, Ashley, MD  traZODone (DESYREL) 50 MG tablet Take 50 mg by mouth at bedtime.  11/26/17 11/26/18 Yes [provider]  vitamin B-12 (CYANOCOBALAMIN) 1000 MCG tablet Take 1,000 mcg by mouth daily.  11/27/17 11/27/18 Yes [provider]    Family History Family History  Problem Relation Age of Onset  . Stroke Father   . Nephrolithiasis Father   . Diabetes type I Maternal Grandfather   . Prostate cancer Neg Hx   . Bladder Cancer Neg Hx     Social History Social History   Tobacco Use  . Smoking status: Never Smoker  . Smokeless  tobacco: Never Used  Substance Use Topics  . Alcohol use: No    Alcohol/week: 0.0 oz  . Drug use: No     Allergies   Cymbalta [duloxetine hcl]   Review of Systems Review of Systems  Constitutional: Negative for chills, diaphoresis and fever (none confirmed).  HENT: Positive for congestion. Negative for sinus pressure, sinus pain, sore throat, trouble swallowing and voice change.   Eyes: Negative for visual disturbance.  Respiratory: Positive for cough. Negative for shortness of breath.   Cardiovascular: Negative for chest pain.  Gastrointestinal: Negative for abdominal pain, diarrhea, nausea and vomiting.  Musculoskeletal: Positive for neck pain. Negative for neck stiffness.  Skin: Negative for rash.  Neurological: Negative for dizziness, seizures, syncope, weakness, light-headedness, numbness and headaches.  All other systems reviewed and are negative.    Physical Exam Updated Vital Signs BP (!) 142/84 (BP Location: Left Arm)   Pulse 94   Temp 98.2 F (36.8 C) (Oral)   Resp 18   SpO2 99%   Physical Exam  Constitutional: He appears well-developed and well-nourished. No distress.  HENT:  Head: Normocephalic and atraumatic.  Mouth/Throat: Oropharynx is clear and moist.  Eyes: Conjunctivae and EOM are normal. Pupils are equal, round, and reactive to light.  Neck: Normal range of motion and full passive range of motion without pain. Neck supple. No neck rigidity. Normal range of motion present.  Cardiovascular: Normal rate, regular rhythm, normal heart sounds and intact distal pulses.  Pulmonary/Chest: Effort normal and breath sounds normal. No respiratory distress.  Abdominal: Soft. There is no tenderness. There is no guarding.  Musculoskeletal: He exhibits no edema or tenderness.  Normal motor function intact in all extremities and spine. No midline spinal tenderness.   Lymphadenopathy:    He has no cervical adenopathy.  Neurological: He is alert.  No sensory  deficits.  No noted speech deficits. No aphasia. Patient handles oral secretions without difficulty. No noted swallowing defects.  Equal grip strength bilaterally. Strength 5/5 in the upper extremities. Strength 5/5 with flexion and extension of the hips, knees, and ankles bilaterally.  Patellar DTRs 2+ bilaterally. Negative Romberg. No gait disturbance.  Coordination intact including heel to shin and finger to nose.  Cranial nerves III-XII grossly intact.  No facial droop.   Skin: Skin is warm and dry. No rash noted. He is not diaphoretic.  Psychiatric: He has a normal mood and affect. His behavior is normal.  Nursing note and vitals reviewed.    ED Treatments / Results  Labs (all labs ordered are listed, but only abnormal results are displayed) Labs Reviewed  COMPREHENSIVE METABOLIC PANEL - Abnormal; Notable for the following components:  Result Value   Glucose, Bld 186 (*)    Creatinine, Ser 1.39 (*)    ALT 86 (*)    GFR calc non Af Amer 57 (*)    All other components within normal limits  CBC WITH DIFFERENTIAL/PLATELET - Abnormal; Notable for the following components:   WBC 13.4 (*)    Neutro Abs 10.0 (*)    All other components within normal limits    EKG  EKG Interpretation None       Radiology No results found.  Procedures Procedures (including critical care time)  Medications Ordered in ED Medications - No data to display   Initial Impression / Assessment and Plan / ED Course  I have reviewed the triage vital signs and the nursing notes.  Pertinent labs & imaging results that were available during my care of the patient were reviewed by me and considered in my medical decision making (see chart for details).     Patient presents with cough plus/minus fever. Patient is nontoxic appearing, afebrile, not tachycardic, not tachypneic, not hypotensive, SPO2 of 99% on room air, and is in no apparent distress.  Patient does have a mild leukocytosis,  however, this is not surprising given the patient's symptoms. I have a very low suspicion for CNS pathology, such as meningitis.  Patient history, physical exam, and overall presentation are not suggestive. I do not see indication for emergent MRI at this time.  Patient was offered a chest x-ray and this order was placed, however, he later declined.  Patient is already been prescribed an appropriate antibiotic for treatment of CAP or bacterial bronchitis.  The patient was given instructions for home care as well as return precautions. Patient voices understanding of these instructions, accepts the plan, and is comfortable with discharge.   Findings and plan of care discussed with Melene Plan, DO. Dr. Adela Lank personally evaluated and examined this patient.  Final Clinical Impressions(s) / ED Diagnoses   Final diagnoses:  Cough    ED Discharge Orders    None       Concepcion Living 02/17/18 0742    Melene Plan, DO 02/17/18 1505

## 2018-02-16 NOTE — Discharge Instructions (Signed)
Please take the previously prescribed antibiotic in its entirety.  Symptoms of some viral infections, such as bronchitis, can persist for a number of weeks.  Follow-up with your primary care provider for any further management.  Return to the ED for worsening symptoms.

## 2018-02-16 NOTE — ED Notes (Signed)
Pt is refusing his Chest xray , he is requesting to be discharged

## 2018-02-16 NOTE — ED Triage Notes (Addendum)
Pt reports being sent here to r/o meningitis. Pt states he is having a cough with fever. Has pain to back of his head when he coughs. Denies neck stiffness/pain. Mask on pt at triage.

## 2018-02-18 ENCOUNTER — Ambulatory Visit: Payer: 59 | Admitting: Student in an Organized Health Care Education/Training Program

## 2018-02-23 DIAGNOSIS — R05 Cough: Secondary | ICD-10-CM | POA: Diagnosis not present

## 2018-02-24 ENCOUNTER — Other Ambulatory Visit: Payer: Self-pay | Admitting: Urology

## 2018-02-24 DIAGNOSIS — N200 Calculus of kidney: Secondary | ICD-10-CM

## 2018-02-25 ENCOUNTER — Telehealth: Payer: Self-pay | Admitting: Podiatry

## 2018-02-25 NOTE — Telephone Encounter (Signed)
I called pt back in regards to his request for his radiology reports to be faxed to his attorney's office [place] [employer] [place]. Pt stated they are expecting the radiology reports. I explained to him that our doctors do not dictate a separate note for the x-rays, that they dictate it in the office visit note. Pt asked me to fax those to Tammy at fax number [phone]. I asked if he had their phone number and Mr. Tanja PortSpicer gave me the number of [phone].

## 2018-02-25 NOTE — Telephone Encounter (Signed)
Yes, this is Brian Butler. If you would, give me a call at [phone] regarding my records. I need something faxed today. I came by this morning and had some x-rays burned on a CD but the radiology report was not included. Could you give me a call so I can give you the fax number to fax over today. They are waiting on it at the attorneys office. [phone] is my call back. Thank you.

## 2018-02-25 NOTE — Telephone Encounter (Signed)
Called Tammy with [employer] and left her a voicemail to call me back directly at [phone] in regards to Mr. Tanja PortSpicer.

## 2018-02-25 NOTE — Telephone Encounter (Signed)
Tammy with [employer] called me back and said I could re-send his office pt's office visit notes. I told Tammy that I had faxed all of his notes to them on 20 February and the last time we saw the pt was 14 February so I did not have anything new to send. Tammy said it was okay and not to worry about it, that she would let the attorney know that the radiology reports were dictated in the office visit notes.

## 2018-03-15 DIAGNOSIS — B379 Candidiasis, unspecified: Secondary | ICD-10-CM | POA: Diagnosis not present

## 2018-03-15 DIAGNOSIS — J040 Acute laryngitis: Secondary | ICD-10-CM | POA: Diagnosis not present

## 2018-03-15 DIAGNOSIS — R490 Dysphonia: Secondary | ICD-10-CM | POA: Diagnosis not present

## 2018-04-05 ENCOUNTER — Ambulatory Visit (HOSPITAL_COMMUNITY)
Admission: RE | Admit: 2018-04-05 | Discharge: 2018-04-05 | Disposition: A | Discharge status: Home / Self Care | Payer: 59 | Source: Ambulatory Visit | Attending: Urology | Admitting: Urology

## 2018-04-05 DIAGNOSIS — N261 Atrophy of kidney (terminal): Secondary | ICD-10-CM | POA: Diagnosis not present

## 2018-04-05 DIAGNOSIS — Z87442 Personal history of urinary calculi: Secondary | ICD-10-CM | POA: Insufficient documentation

## 2018-04-05 DIAGNOSIS — N183 Chronic kidney disease, stage 3 (moderate): Secondary | ICD-10-CM | POA: Diagnosis not present

## 2018-04-05 DIAGNOSIS — N200 Calculus of kidney: Secondary | ICD-10-CM | POA: Diagnosis not present

## 2018-04-05 MED ORDER — TECHNETIUM TC 99M MERTIATIDE
4.4000 | Freq: Once | INTRAVENOUS | Status: AC | PRN
  Administered 2018-04-05: 4.4 via INTRAVENOUS

## 2018-04-05 MED ORDER — FUROSEMIDE 10 MG/ML IJ SOLN
INTRAMUSCULAR | Status: AC
  Filled 2018-04-05: qty 8

## 2018-04-05 MED ORDER — FUROSEMIDE 10 MG/ML IJ SOLN
48.0000 mg | Freq: Once | INTRAMUSCULAR | Status: AC
  Administered 2018-04-05: 48 mg via INTRAVENOUS

## 2018-04-07 DIAGNOSIS — I10 Essential (primary) hypertension: Secondary | ICD-10-CM | POA: Diagnosis not present

## 2018-04-07 DIAGNOSIS — E1140 Type 2 diabetes mellitus with diabetic neuropathy, unspecified: Secondary | ICD-10-CM | POA: Diagnosis not present

## 2018-04-07 DIAGNOSIS — M542 Cervicalgia: Secondary | ICD-10-CM | POA: Diagnosis not present

## 2018-04-07 DIAGNOSIS — R05 Cough: Secondary | ICD-10-CM | POA: Diagnosis not present

## 2018-04-12 ENCOUNTER — Other Ambulatory Visit: Payer: Self-pay | Admitting: Endocrinology

## 2018-04-16 ENCOUNTER — Ambulatory Visit: Payer: 59 | Admitting: Endocrinology

## 2018-04-16 ENCOUNTER — Encounter: Payer: Self-pay | Admitting: Endocrinology

## 2018-04-16 VITALS — BP 130/78 | HR 70 | Wt 207.0 lb

## 2018-04-16 DIAGNOSIS — E1142 Type 2 diabetes mellitus with diabetic polyneuropathy: Secondary | ICD-10-CM | POA: Diagnosis not present

## 2018-04-16 LAB — POCT GLYCOSYLATED HEMOGLOBIN (HGB A1C): HEMOGLOBIN A1C: 6.6

## 2018-04-16 NOTE — Progress Notes (Signed)
Subjective:    Patient ID: Brian Butler, male    DOB: [birth year], 52 y.o.   MRN: [id number]  HPI Pt returns for f/u of diabetes mellitus: DM type: 2 Dx'ed: 2016 Complications: polyneuropathy and renal insufficiency Therapy: tradjenta DKA: never Severe hypoglycemia: never Pancreatitis: never Pancreatic imaging: normal on 2017 CT.   Other: he has never been on insulin; rx options are limited by renal insuff and edema.  he stopped using freestyle libre device Interval history:  pt states he feels well in general.  He takes tradjenta as rx'ed.  pt states he feels well in general. Past Medical History:  Diagnosis Date  . Arthritis   . Chronic neck and back pain   . CKD (chronic kidney disease), stage III [facility]) nephrologist-  dr deterding  . Hx of hypotestosteronemia 09/05/2015  . Hyperlipidemia   . Hypertension   . Left ureteral stone   . Nephrolithiasis    right non-obstructive per CT 08/ 2018 urology office  . Nocturia   . Peripheral neuropathy   . Plantar fasciitis, bilateral   . Type 2 diabetes mellitus (HCC)     Past Surgical History:  Procedure Laterality Date  . ANTERIOR CERVICAL DECOMP/DISCECTOMY FUSION  2006  approx.   C4 --5  . CARDIAC CATHETERIZATION N/A 04/23/2016   Procedure: Left Heart Cath and Coronary Angiography;  Surgeon: Lyn Records, MD;  Location: [facility] INVASIVE CV LAB;  Service: Cardiovascular;  Laterality: N/A;  abnormal nuclear study and precordial pain:  normal coronary arteries, normal LV size, funciton (ef 55-65%) and filling pressures (LVEDP is )  . CARDIOVASCULAR STRESS TEST  04-22-2016   dr Verdis Prime-- normal LV function and wall motion , ef 55-65%   Inermediate nuclear study w/ reversible small defect of mild severity present in the mid anterior location and concerning for a very small area of ischemia mid anterior wall;  partially reversible medium defect of mild severity in the basal anterosetpal, mid anteroseptal , and apical septal  locasion that could be soft tissue attenuation but also could be concerning for small area of ischemia  . CYSTOSCOPY W/ RETROGRADES Left 01/16/2016   Procedure: CYSTOSCOPY WITH RETROGRADE PYELOGRAM;  Surgeon: Hildred Laser, MD;  Location: [facility];  Service: Urology;  Laterality: Left;  . CYSTOSCOPY WITH RETROGRADE PYELOGRAM, URETEROSCOPY AND STENT PLACEMENT Left 09/18/2017   Procedure: CYSTOSCOPY WITH RETROGRADE PYELOGRAM, URETEROSCOPY AND STENT PLACEMENT;  Surgeon: Crista Elliot, MD;  Location: [facility];  Service: Urology;  Laterality: Left;  . CYSTOSCOPY WITH STENT PLACEMENT Left 12/05/2015   Procedure: CYSTOSCOPY WITH STENT PLACEMENT;  Surgeon: Hildred Laser, MD;  Location: [facility];  Service: Urology;  Laterality: Left;  . CYSTOSCOPY WITH STENT PLACEMENT Left 01/16/2016   Procedure: CYSTOSCOPY WITH STENT PLACEMENT/ EXCHANGE;  Surgeon: Hildred Laser, MD;  Location: [facility];  Service: Urology;  Laterality: Left;  . CYSTOSCOPY/URETEROSCOPY/HOLMIUM LASER/STENT PLACEMENT Left 10/20/2016   Procedure: CYSTOSCOPY/URETEROSCOPY/HOLMIUM LASER/STENT PLACEMENT;  Surgeon: Vanna Scotland, MD;  Location: [facility];  Service: Urology;  Laterality: Left;  . EXTRACORPOREAL SHOCK WAVE LITHOTRIPSY Left 09/20/2015   Procedure: EXTRACORPOREAL SHOCK WAVE LITHOTRIPSY (ESWL);  Surgeon: Hildred Laser, MD;  Location: [facility];  Service: Urology;  Laterality: Left;  . EXTRACORPOREAL SHOCK WAVE LITHOTRIPSY Left 11/15/2015   Procedure: EXTRACORPOREAL SHOCK WAVE LITHOTRIPSY (ESWL);  Surgeon: Vanna Scotland, MD;  Location: [facility];  Service: Urology;  Laterality: Left;  . HOLMIUM LASER APPLICATION Left 09/18/2017   Procedure: HOLMIUM LASER APPLICATION;  Surgeon: Crista Elliot, MD;  Location: [facility];  Service: Urology;  Laterality: Left;  . URETEROSCOPY WITH HOLMIUM LASER LITHOTRIPSY Left 12/05/2015   Procedure: URETEROSCOPY WITH HOLMIUM LASER LITHOTRIPSY;   Surgeon: Hildred Laser, MD;  Location: [facility];  Service: Urology;  Laterality: Left;  . URETEROSCOPY WITH HOLMIUM LASER LITHOTRIPSY Left 01/16/2016   Procedure: URETEROSCOPY WITH HOLMIUM LASER LITHOTRIPSY;  Surgeon: Hildred Laser, MD;  Location: [facility];  Service: Urology;  Laterality: Left;    Social History   Socioeconomic History  . Marital status: Married    Spouse name: Not on file  . Number of children: Not on file  . Years of education: Not on file  . Highest education level: Not on file  Occupational History  . Not on file  Social Needs  . Financial resource strain: Not on file  . Food insecurity:    Worry: Not on file    Inability: Not on file  . Transportation needs:    Medical: Not on file    Non-medical: Not on file  Tobacco Use  . Smoking status: Never Smoker  . Smokeless tobacco: Never Used  Substance and Sexual Activity  . Alcohol use: No    Alcohol/week: 0.0 oz  . Drug use: No  . Sexual activity: Not Currently  Lifestyle  . Physical activity:    Days per week: Not on file    Minutes per session: Not on file  . Stress: Not on file  Relationships  . Social connections:    Talks on phone: Not on file    Gets together: Not on file    Attends religious service: Not on file    Active member of club or organization: Not on file    Attends meetings of clubs or organizations: Not on file    Relationship status: Not on file  . Intimate partner violence:    Fear of current or ex partner: Not on file    Emotionally abused: Not on file    Physically abused: Not on file    Forced sexual activity: Not on file  Other Topics Concern  . Not on file  Social History Narrative  . Not on file    Current Outpatient Medications on File Prior to Visit  Medication Sig Dispense Refill  . aspirin EC 81 MG tablet Take 81 mg by mouth daily.    Marland Kitchen atorvastatin (LIPITOR) 10 MG tablet Take 1 tablet (10 mg total) by mouth daily at 6 PM. (Patient taking differently:  Take 10 mg by mouth every evening. ) 90 tablet 3  . gabapentin (NEURONTIN) 300 MG capsule TAKE 1 CAPSULE BY MOUTH TWICE DAILY AND 2 TABLETS AT BEDTIME (Patient taking differently: TAKE 1 CAPSULE ( ) BY MOUTH TWICE DAILY AND 2 TABLETS ( ) AT BEDTIME) 360 capsule 1  . lisinopril (PRINIVIL,ZESTRIL) 20 MG tablet Take 20 mg by mouth daily.   3  . potassium citrate (UROCIT-K) 10 MEQ (1080 MG) SR tablet Take 2 tablets (20 mEq total) by mouth 3 (three) times daily with meals. 90 tablet 11  . TRADJENTA 5 MG TABS tablet TAKE 1 TABLET (5 MG TOTAL) BY MOUTH DAILY. 30 tablet 11  . traZODone (DESYREL) 50 MG tablet Take 50 mg by mouth at bedtime.     . vitamin B-12 (CYANOCOBALAMIN) 1000 MCG tablet Take 1,000 mcg by mouth daily.     . nortriptyline (PAMELOR) 25 MG capsule Take by mouth.     No current facility-administered  medications on file prior to visit.     Allergies  Allergen Reactions  . Cymbalta [Duloxetine Hcl] Other (See Comments)    insomnia    Family History  Problem Relation Age of Onset  . Stroke Father   . Nephrolithiasis Father   . Diabetes type I Maternal Grandfather   . Prostate cancer Neg Hx   . Bladder Cancer Neg Hx     BP 130/78   Pulse 70   Wt 207 lb (93.9 kg)   SpO2 97%   BMI 28.07 kg/m    Review of Systems He denies hypoglycemia    Objective:   Physical Exam VITAL SIGNS:  See vs page GENERAL: no distress Pulses: dorsalis pedis intact bilat.   MSK: no deformity of the feet CV: no leg edema.  Skin:  no ulcer on the feet.  normal color and temp on the feet. Neuro: sensation is intact to touch on the feet, but decreased from normal.    A1c=6.6%     Assessment & Plan:  Type 2 DM: well-controlled Renal insuff: this limits rx options.  Patient Instructions  check your blood sugar twice a week.  vary the time of day when you check, between before the 3 meals, and at bedtime.  also check if you have symptoms of your blood sugar being too high or too low.   please keep a record of the readings and bring it to your next appointment here (or you can bring the meter itself).  You can write it on any piece of paper.  please call us sooner if your blood sugar goes below 70, or if you have a lot of readings over 200.   Please continue the same tradjenta.  Please come back for a follow-up appointment in 4-6 months.

## 2018-04-16 NOTE — Patient Instructions (Signed)
check your blood sugar twice a week.  vary the time of day when you check, between before the 3 meals, and at bedtime.  also check if you have symptoms of your blood sugar being too high or too low.  please keep a record of the readings and bring it to your next appointment here (or you can bring the meter itself).  You can write it on any piece of paper.  please call us sooner if your blood sugar goes below 70, or if you have a lot of readings over 200.   Please continue the same tradjenta.  Please come back for a follow-up appointment in 4-6 months.

## 2018-05-21 DIAGNOSIS — I10 Essential (primary) hypertension: Secondary | ICD-10-CM | POA: Diagnosis not present

## 2018-05-21 DIAGNOSIS — N182 Chronic kidney disease, stage 2 (mild): Secondary | ICD-10-CM | POA: Diagnosis not present

## 2018-05-21 DIAGNOSIS — M159 Polyosteoarthritis, unspecified: Secondary | ICD-10-CM | POA: Diagnosis not present

## 2018-05-21 DIAGNOSIS — E1140 Type 2 diabetes mellitus with diabetic neuropathy, unspecified: Secondary | ICD-10-CM | POA: Diagnosis not present

## 2018-05-27 DIAGNOSIS — H60501 Unspecified acute noninfective otitis externa, right ear: Secondary | ICD-10-CM | POA: Diagnosis not present

## 2018-05-27 DIAGNOSIS — I10 Essential (primary) hypertension: Secondary | ICD-10-CM | POA: Diagnosis not present

## 2018-05-27 DIAGNOSIS — E1140 Type 2 diabetes mellitus with diabetic neuropathy, unspecified: Secondary | ICD-10-CM | POA: Diagnosis not present

## 2018-05-27 DIAGNOSIS — J209 Acute bronchitis, unspecified: Secondary | ICD-10-CM | POA: Diagnosis not present

## 2018-05-28 DIAGNOSIS — E349 Endocrine disorder, unspecified: Secondary | ICD-10-CM | POA: Diagnosis not present

## 2018-05-28 DIAGNOSIS — N279 Small kidney, unspecified: Secondary | ICD-10-CM | POA: Diagnosis not present

## 2018-05-28 DIAGNOSIS — R948 Abnormal results of function studies of other organs and systems: Secondary | ICD-10-CM | POA: Diagnosis not present

## 2018-05-28 DIAGNOSIS — N200 Calculus of kidney: Secondary | ICD-10-CM | POA: Diagnosis not present

## 2018-08-17 DIAGNOSIS — I129 Hypertensive chronic kidney disease with stage 1 through stage 4 chronic kidney disease, or unspecified chronic kidney disease: Secondary | ICD-10-CM | POA: Diagnosis not present

## 2018-08-17 DIAGNOSIS — N261 Atrophy of kidney (terminal): Secondary | ICD-10-CM | POA: Diagnosis not present

## 2018-08-17 DIAGNOSIS — D631 Anemia in chronic kidney disease: Secondary | ICD-10-CM | POA: Diagnosis not present

## 2018-08-17 DIAGNOSIS — R109 Unspecified abdominal pain: Secondary | ICD-10-CM | POA: Diagnosis not present

## 2018-08-17 DIAGNOSIS — E349 Endocrine disorder, unspecified: Secondary | ICD-10-CM | POA: Diagnosis not present

## 2018-08-17 DIAGNOSIS — N182 Chronic kidney disease, stage 2 (mild): Secondary | ICD-10-CM | POA: Diagnosis not present

## 2018-08-17 DIAGNOSIS — E1122 Type 2 diabetes mellitus with diabetic chronic kidney disease: Secondary | ICD-10-CM | POA: Diagnosis not present

## 2018-08-17 DIAGNOSIS — N200 Calculus of kidney: Secondary | ICD-10-CM | POA: Diagnosis not present

## 2018-08-17 DIAGNOSIS — R948 Abnormal results of function studies of other organs and systems: Secondary | ICD-10-CM | POA: Diagnosis not present

## 2018-08-17 DIAGNOSIS — N2581 Secondary hyperparathyroidism of renal origin: Secondary | ICD-10-CM | POA: Diagnosis not present

## 2018-08-17 DIAGNOSIS — E785 Hyperlipidemia, unspecified: Secondary | ICD-10-CM | POA: Diagnosis not present

## 2018-08-24 DIAGNOSIS — N182 Chronic kidney disease, stage 2 (mild): Secondary | ICD-10-CM | POA: Diagnosis not present

## 2018-08-24 DIAGNOSIS — E349 Endocrine disorder, unspecified: Secondary | ICD-10-CM | POA: Diagnosis not present

## 2018-08-24 DIAGNOSIS — F4323 Adjustment disorder with mixed anxiety and depressed mood: Secondary | ICD-10-CM | POA: Diagnosis not present

## 2018-08-24 DIAGNOSIS — M542 Cervicalgia: Secondary | ICD-10-CM | POA: Diagnosis not present

## 2018-08-24 DIAGNOSIS — M545 Low back pain: Secondary | ICD-10-CM | POA: Diagnosis not present

## 2018-08-24 DIAGNOSIS — Z87442 Personal history of urinary calculi: Secondary | ICD-10-CM | POA: Diagnosis not present

## 2018-08-24 DIAGNOSIS — I10 Essential (primary) hypertension: Secondary | ICD-10-CM | POA: Diagnosis not present

## 2018-08-24 DIAGNOSIS — N261 Atrophy of kidney (terminal): Secondary | ICD-10-CM | POA: Diagnosis not present

## 2018-09-22 DIAGNOSIS — F411 Generalized anxiety disorder: Secondary | ICD-10-CM | POA: Diagnosis not present

## 2018-09-22 DIAGNOSIS — R002 Palpitations: Secondary | ICD-10-CM | POA: Diagnosis not present

## 2018-09-22 DIAGNOSIS — I10 Essential (primary) hypertension: Secondary | ICD-10-CM | POA: Diagnosis not present

## 2018-09-22 DIAGNOSIS — E1140 Type 2 diabetes mellitus with diabetic neuropathy, unspecified: Secondary | ICD-10-CM | POA: Diagnosis not present

## 2018-10-18 ENCOUNTER — Ambulatory Visit: Payer: 59 | Admitting: Endocrinology

## 2018-10-19 ENCOUNTER — Ambulatory Visit (INDEPENDENT_AMBULATORY_CARE_PROVIDER_SITE_OTHER): Payer: 59 | Admitting: Endocrinology

## 2018-10-19 ENCOUNTER — Encounter: Payer: Self-pay | Admitting: Endocrinology

## 2018-10-19 VITALS — BP 138/82 | HR 73 | Ht 72.0 in | Wt 225.4 lb

## 2018-10-19 DIAGNOSIS — E1142 Type 2 diabetes mellitus with diabetic polyneuropathy: Secondary | ICD-10-CM | POA: Diagnosis not present

## 2018-10-19 LAB — POCT GLYCOSYLATED HEMOGLOBIN (HGB A1C): Hemoglobin A1C: 7.3 % — AB (ref 4.0–5.6)

## 2018-10-19 MED ORDER — POTASSIUM CITRATE ER 10 MEQ (1080 MG) PO TBCR: 20.0000 meq | EXTENDED_RELEASE_TABLET | Freq: Every day | ORAL | 11 refills | Status: DC

## 2018-10-19 MED ORDER — BROMOCRIPTINE MESYLATE 2.5 MG PO TABS: ORAL_TABLET | ORAL | 3 refills | Status: AC

## 2018-10-19 NOTE — Patient Instructions (Addendum)
I have sent a prescription to your pharmacy, to add "bromocriptine." Call if you have no nausea or dizziness, so we can double this.   check your blood sugar once a day.  vary the time of day when you check, between before the 3 meals, and at bedtime.  also check if you have symptoms of your blood sugar being too high or too low.  please keep a record of the readings and bring it to your next appointment here (or you can bring the meter itself).  You can write it on any piece of paper.  please call us sooner if your blood sugar goes below 70, or if you have a lot of readings over 200. Please come back for a follow-up appointment in 2 months.

## 2018-10-19 NOTE — Progress Notes (Signed)
Subjective:    Patient ID: Brian Butler, male    DOB: 03-01-1966, 52 y.o.   MRN: [id number]  HPI Pt returns for f/u of diabetes mellitus: DM type: 2 Dx'ed: 2016 Complications: polyneuropathy and renal insufficiency Therapy: tradjenta DKA: never Severe hypoglycemia: never Pancreatitis: never Pancreatic imaging: normal on 2017 CT.   Other: he has never been on insulin; rx options are limited by renal insuff and intermitt leg edema.  he stopped using freestyle libre device.   Interval history:  pt states he feels well in general.  He takes tradjenta as rx'ed.  pt states he feels well in general.  Pt says he takes KLOR, 2x10 mEq QD, not TID.   Past Medical History:  Diagnosis Date  . Arthritis   . Chronic neck and back pain   . CKD (chronic kidney disease), stage III [facility](HCC) nephrologist-  dr deterding  . Hx of hypotestosteronemia 09/05/2015  . Hyperlipidemia   . Hypertension   . Left ureteral stone   . Nephrolithiasis    right non-obstructive per CT 08/ 2018 urology office  . Nocturia   . Peripheral neuropathy   . Plantar fasciitis, bilateral   . Type 2 diabetes mellitus (HCC)     Past Surgical History:  Procedure Laterality Date  . ANTERIOR CERVICAL DECOMP/DISCECTOMY FUSION  2006  approx.   C4 --5  . CARDIAC CATHETERIZATION N/A 04/23/2016   Procedure: Left Heart Cath and Coronary Angiography;  Surgeon: Lyn RecordsHenry W Smith, MD;  [facility];  Service: Cardiovascular;  Laterality: N/A;  abnormal nuclear study and precordial pain:  normal coronary arteries, normal LV size, funciton (ef 55-65%) and filling pressures (LVEDP is 16mmHg)  . CARDIOVASCULAR STRESS TEST  04-22-2016   dr Verdis Primehenry smith-- normal LV function and wall motion , ef 55-65%   Inermediate nuclear study w/ reversible small defect of mild severity present in the mid anterior location and concerning for a very small area of ischemia mid anterior wall;  partially reversible medium defect of mild severity in the  basal anterosetpal, mid anteroseptal , and apical septal locasion that could be soft tissue attenuation but also could be concerning for small area of ischemia  . CYSTOSCOPY W/ RETROGRADES Left 01/16/2016   Procedure: CYSTOSCOPY WITH RETROGRADE PYELOGRAM;  Surgeon: Hildred LaserBrian James Budzyn, MD;  Location: [facility];  Service: Urology;  Laterality: Left;  . CYSTOSCOPY WITH RETROGRADE PYELOGRAM, URETEROSCOPY AND STENT PLACEMENT Left 09/18/2017   Procedure: CYSTOSCOPY WITH RETROGRADE PYELOGRAM, URETEROSCOPY AND STENT PLACEMENT;  Surgeon: Crista ElliotBell, Eugene D III, MD;  [facility];  Service: Urology;  Laterality: Left;  . CYSTOSCOPY WITH STENT PLACEMENT Left 12/05/2015   Procedure: CYSTOSCOPY WITH STENT PLACEMENT;  Surgeon: Hildred LaserBrian James Budzyn, MD;  Location: [facility];  Service: Urology;  Laterality: Left;  . CYSTOSCOPY WITH STENT PLACEMENT Left 01/16/2016   Procedure: CYSTOSCOPY WITH STENT PLACEMENT/ EXCHANGE;  Surgeon: Hildred LaserBrian James Budzyn, MD;  Location: [facility];  Service: Urology;  Laterality: Left;  . CYSTOSCOPY/URETEROSCOPY/HOLMIUM LASER/STENT PLACEMENT Left 10/20/2016   Procedure: CYSTOSCOPY/URETEROSCOPY/HOLMIUM LASER/STENT PLACEMENT;  Surgeon: Vanna ScotlandAshley Brandon, MD;  Location: [facility];  Service: Urology;  Laterality: Left;  . EXTRACORPOREAL SHOCK WAVE LITHOTRIPSY Left 09/20/2015   Procedure: EXTRACORPOREAL SHOCK WAVE LITHOTRIPSY (ESWL);  Surgeon: Hildred LaserBrian James Budzyn, MD;  Location: [facility];  Service: Urology;  Laterality: Left;  . EXTRACORPOREAL SHOCK WAVE LITHOTRIPSY Left 11/15/2015   Procedure: EXTRACORPOREAL SHOCK WAVE LITHOTRIPSY (ESWL);  Surgeon: Vanna ScotlandAshley Brandon, MD;  Location: [facility];  Service: Urology;  Laterality: Left;  . HOLMIUM LASER APPLICATION Left 09/18/2017   Procedure: HOLMIUM LASER APPLICATION;  Surgeon: Crista Elliot, MD;  Location: [facility];  Service: Urology;  Laterality: Left;  . URETEROSCOPY WITH HOLMIUM LASER LITHOTRIPSY Left 12/05/2015    Procedure: URETEROSCOPY WITH HOLMIUM LASER LITHOTRIPSY;  Surgeon: Hildred Laser, MD;  Location: [facility];  Service: Urology;  Laterality: Left;  . URETEROSCOPY WITH HOLMIUM LASER LITHOTRIPSY Left 01/16/2016   Procedure: URETEROSCOPY WITH HOLMIUM LASER LITHOTRIPSY;  Surgeon: Hildred Laser, MD;  Location: [facility];  Service: Urology;  Laterality: Left;    Social History   Socioeconomic History  . Marital status: Married    Spouse name: Not on file  . Number of children: Not on file  . Years of education: Not on file  . Highest education level: Not on file  Occupational History  . Not on file  Social Needs  . Financial resource strain: Not on file  . Food insecurity:    Worry: Not on file    Inability: Not on file  . Transportation needs:    Medical: Not on file    Non-medical: Not on file  Tobacco Use  . Smoking status: Never Smoker  . Smokeless tobacco: Never Used  Substance and Sexual Activity  . Alcohol use: No    Alcohol/week: 0.0 standard drinks  . Drug use: No  . Sexual activity: Not Currently  Lifestyle  . Physical activity:    Days per week: Not on file    Minutes per session: Not on file  . Stress: Not on file  Relationships  . Social connections:    Talks on phone: Not on file    Gets together: Not on file    Attends religious service: Not on file    Active member of club or organization: Not on file    Attends meetings of clubs or organizations: Not on file    Relationship status: Not on file  . Intimate partner violence:    Fear of current or ex partner: Not on file    Emotionally abused: Not on file    Physically abused: Not on file    Forced sexual activity: Not on file  Other Topics Concern  . Not on file  Social History Narrative  . Not on file    Current Outpatient Medications on File Prior to Visit  Medication Sig Dispense Refill  . aspirin EC 81 MG tablet Take 81 mg by mouth daily.    Marland Kitchen atorvastatin (LIPITOR) 10 MG tablet Take 1  tablet (10 mg total) by mouth daily at 6 PM. (Patient taking differently: Take 10 mg by mouth every evening. ) 90 tablet 3  . gabapentin (NEURONTIN) 300 MG capsule TAKE 1 CAPSULE BY MOUTH TWICE DAILY AND 2 TABLETS AT BEDTIME (Patient taking differently: TAKE 1 CAPSULE (300mg ) BY MOUTH TWICE DAILY AND 2 TABLETS (600mg ) AT BEDTIME) 360 capsule 1  . lisinopril (PRINIVIL,ZESTRIL) 20 MG tablet Take 20 mg by mouth daily.   3  . TRADJENTA 5 MG TABS tablet TAKE 1 TABLET (5 MG TOTAL) BY MOUTH DAILY. 30 tablet 11  . traZODone (DESYREL) 50 MG tablet Take 50 mg by mouth at bedtime.     . vitamin B-12 (CYANOCOBALAMIN) 1000 MCG tablet Take 1,000 mcg by mouth daily.     . nortriptyline (PAMELOR) 25 MG capsule Take by mouth.     No current facility-administered medications on file prior to visit.     Allergies  Allergen Reactions  . Cymbalta [Duloxetine Hcl] Other (See Comments)    insomnia    Family History  Problem Relation Age of Onset  . Stroke Father   . Nephrolithiasis Father   . Diabetes type I Maternal Grandfather   . Prostate cancer Neg Hx   . Bladder Cancer Neg Hx     BP 138/82 (BP Location: Left Arm, Patient Position: Sitting, Cuff Size: Normal)   Pulse 73   Ht 6' (1.829 m)   Wt 225 lb 6.4 oz (102.2 kg)   SpO2 94%   BMI 30.57 kg/m    Review of Systems Denies swelling of the legs.      Objective:   Physical Exam VITAL SIGNS:  See vs page GENERAL: no distress Pulses: foot pulses are intact bilaterally.   MSK: no deformity of the feet or ankles.  CV: no edema of the legs or ankles Skin:  no ulcer on the feet or ankles.  normal color and temp on the feet and ankles Neuro: sensation is intact to touch on the feet and ankles, but decreased from normal.    Lab Results  Component Value Date   HGBA1C 7.3 (A) 10/19/2018   Lab Results  Component Value Date   CREATININE 1.39 (H) 02/16/2018   BUN 13 02/16/2018   NA 137 02/16/2018   K 4.5 02/16/2018   CL 102 02/16/2018   CO2  27 02/16/2018       Assessment & Plan:  Type 2 DM, with polyneuropathy, worse Rena insuff: this limits rx options Hypokalemia: I changed med list to what pt is taking.   Patient Instructions  I have sent a prescription to your pharmacy, to add "bromocriptine." Call if you have no nausea or dizziness, so we can double this.   check your blood sugar once a day.  vary the time of day when you check, between before the 3 meals, and at bedtime.  also check if you have symptoms of your blood sugar being too high or too low.  please keep a record of the readings and bring it to your next appointment here (or you can bring the meter itself).  You can write it on any piece of paper.  please call us sooner if your blood sugar goes below 70, or if you have a lot of readings over 200. Please come back for a follow-up appointment in 2 months.

## 2018-10-23 ENCOUNTER — Other Ambulatory Visit: Payer: Self-pay

## 2018-10-23 ENCOUNTER — Emergency Department
Admission: EM | Admit: 2018-10-23 | Discharge: 2018-10-23 | Disposition: A | Discharge status: Home / Self Care | Payer: 59 | Attending: Emergency Medicine | Admitting: Emergency Medicine

## 2018-10-23 DIAGNOSIS — R42 Dizziness and giddiness: Secondary | ICD-10-CM | POA: Diagnosis not present

## 2018-10-23 DIAGNOSIS — T887XXA Unspecified adverse effect of drug or medicament, initial encounter: Secondary | ICD-10-CM | POA: Insufficient documentation

## 2018-10-23 DIAGNOSIS — E785 Hyperlipidemia, unspecified: Secondary | ICD-10-CM | POA: Insufficient documentation

## 2018-10-23 DIAGNOSIS — T465X5A Adverse effect of other antihypertensive drugs, initial encounter: Secondary | ICD-10-CM | POA: Insufficient documentation

## 2018-10-23 DIAGNOSIS — I959 Hypotension, unspecified: Secondary | ICD-10-CM | POA: Diagnosis present

## 2018-10-23 DIAGNOSIS — Z79899 Other long term (current) drug therapy: Secondary | ICD-10-CM | POA: Diagnosis not present

## 2018-10-23 DIAGNOSIS — Y829 Unspecified medical devices associated with adverse incidents: Secondary | ICD-10-CM | POA: Insufficient documentation

## 2018-10-23 DIAGNOSIS — Z7982 Long term (current) use of aspirin: Secondary | ICD-10-CM | POA: Insufficient documentation

## 2018-10-23 DIAGNOSIS — I129 Hypertensive chronic kidney disease with stage 1 through stage 4 chronic kidney disease, or unspecified chronic kidney disease: Secondary | ICD-10-CM | POA: Diagnosis not present

## 2018-10-23 DIAGNOSIS — Z7984 Long term (current) use of oral hypoglycemic drugs: Secondary | ICD-10-CM | POA: Diagnosis not present

## 2018-10-23 DIAGNOSIS — R55 Syncope and collapse: Secondary | ICD-10-CM | POA: Insufficient documentation

## 2018-10-23 DIAGNOSIS — E1122 Type 2 diabetes mellitus with diabetic chronic kidney disease: Secondary | ICD-10-CM | POA: Diagnosis not present

## 2018-10-23 DIAGNOSIS — N183 Chronic kidney disease, stage 3 (moderate): Secondary | ICD-10-CM | POA: Diagnosis not present

## 2018-10-23 LAB — CBC
HCT: 43.2 % (ref 39.0–52.0)
Hemoglobin: 14.4 g/dL (ref 13.0–17.0)
MCH: 29.6 pg (ref 26.0–34.0)
MCHC: 33.3 g/dL (ref 30.0–36.0)
MCV: 88.7 fL (ref 80.0–100.0)
PLATELETS: 183 10*3/uL (ref 150–400)
RBC: 4.87 MIL/uL (ref 4.22–5.81)
RDW: 12.4 % (ref 11.5–15.5)
WBC: 7.6 10*3/uL (ref 4.0–10.5)
nRBC: 0 % (ref 0.0–0.2)

## 2018-10-23 LAB — BASIC METABOLIC PANEL
ANION GAP: 7 (ref 5–15)
BUN: 21 mg/dL — AB (ref 6–20)
CALCIUM: 9.2 mg/dL (ref 8.9–10.3)
CO2: 31 mmol/L (ref 22–32)
CREATININE: 1.42 mg/dL — AB (ref 0.61–1.24)
Chloride: 100 mmol/L (ref 98–111)
GFR calc Af Amer: >60 mL/min (ref >60)
GFR, EST NON AFRICAN AMERICAN: 56 mL/min — AB (ref >60)
GLUCOSE: 158 mg/dL — AB (ref 70–99)
Potassium: 4.1 mmol/L (ref 3.5–5.1)
Sodium: 138 mmol/L (ref 135–145)

## 2018-10-23 LAB — GLUCOSE, CAPILLARY: GLUCOSE-CAPILLARY: 139 mg/dL — AB (ref 70–99)

## 2018-10-23 MED ORDER — ONDANSETRON 4 MG PO TBDP
8.0000 mg | ORAL_TABLET | Freq: Once | ORAL | Status: AC
  Administered 2018-10-23: 8 mg via ORAL
  Filled 2018-10-23: qty 2

## 2018-10-23 NOTE — ED Provider Notes (Addendum)
[facility] Emergency Department Provider Note   ____________________________________________    I have reviewed the triage vital signs and the nursing notes.   HISTORY  Chief Complaint Hypotension     HPI Brian Butler is a 52 y.o. male who presents because his blood pressure was 90/50 at home.  Patient reports that he has felt mildly nauseated and lightheaded most of the day since taking his medication this morning.  He reports he took his blood pressure this afternoon multiple times and each time it was around 95/50 which made him quite concerned.  He reports he has a history of chronic kidney disease and has been told that if his blood pressure gets too low it can damage his kidneys.  He denies palpitations chest pain or shortness of breath.  Currently reports he is feeling significantly better   Past Medical History:  Diagnosis Date  . Arthritis   . Chronic neck and back pain   . CKD (chronic kidney disease), stage III [facility]) nephrologist-  dr deterding  . Hx of hypotestosteronemia 09/05/2015  . Hyperlipidemia   . Hypertension   . Left ureteral stone   . Nephrolithiasis    right non-obstructive per CT 08/ 2018 urology office  . Nocturia   . Peripheral neuropathy   . Plantar fasciitis, bilateral   . Type 2 diabetes mellitus [facility])     Patient Active Problem List   Diagnosis Date Noted  . Degenerative disc disease, cervical 01/21/2018  . S/P cervical spinal fusion 01/21/2018  . Cervicalgia 01/21/2018  . Chronic pain syndrome 01/21/2018  . Diabetes (HCC) 09/18/2016  . Biceps tendonitis on left 08/07/2016  . Pigmented skin lesion of uncertain nature 08/07/2016  . Neck muscle spasm 08/07/2016  . Plantar fasciitis 05/30/2016  . Chronic heel pain 05/30/2016  . Hyperlipidemia associated with type 2 diabetes mellitus (HCC) 04/30/2016  . Chronic low back pain 04/30/2016  . Overweight (BMI 25.0-29.9) 04/30/2016  . Abnormal nuclear cardiac  imaging test 04/23/2016  . Unstable angina (HCC)   . Essential hypertension 09/05/2015  . Nephrolithiasis 09/05/2015  . Hx of hypotestosteronemia 09/05/2015    Past Surgical History:  Procedure Laterality Date  . ANTERIOR CERVICAL DECOMP/DISCECTOMY FUSION  2006  approx.   C4 --5  . CARDIAC CATHETERIZATION N/A 04/23/2016   Procedure: Left Heart Cath and Coronary Angiography;  Surgeon: Lyn Records, MD;  Location: [facility] INVASIVE CV LAB;  Service: Cardiovascular;  Laterality: N/A;  abnormal nuclear study and precordial pain:  normal coronary arteries, normal LV size, funciton (ef 55-65%) and filling pressures (LVEDP is )  . CARDIOVASCULAR STRESS TEST  04-22-2016   dr Verdis Prime-- normal LV function and wall motion , ef 55-65%   Inermediate nuclear study w/ reversible small defect of mild severity present in the mid anterior location and concerning for a very small area of ischemia mid anterior wall;  partially reversible medium defect of mild severity in the basal anterosetpal, mid anteroseptal , and apical septal locasion that could be soft tissue attenuation but also could be concerning for small area of ischemia  . CYSTOSCOPY W/ RETROGRADES Left 01/16/2016   Procedure: CYSTOSCOPY WITH RETROGRADE PYELOGRAM;  Surgeon: Hildred Laser, MD;  Location: [facility];  Service: Urology;  Laterality: Left;  . CYSTOSCOPY WITH RETROGRADE PYELOGRAM, URETEROSCOPY AND STENT PLACEMENT Left 09/18/2017   Procedure: CYSTOSCOPY WITH RETROGRADE PYELOGRAM, URETEROSCOPY AND STENT PLACEMENT;  Surgeon: Crista Elliot, MD;  Location: [facility];  Service:  Urology;  Laterality: Left;  . CYSTOSCOPY WITH STENT PLACEMENT Left 12/05/2015   Procedure: CYSTOSCOPY WITH STENT PLACEMENT;  Surgeon: Hildred LaserBrian James Budzyn, MD;  Location: [facility];  Service: Urology;  Laterality: Left;  . CYSTOSCOPY WITH STENT PLACEMENT Left 01/16/2016   Procedure: CYSTOSCOPY WITH STENT PLACEMENT/ EXCHANGE;  Surgeon: Hildred LaserBrian James  Budzyn, MD;  Location: [facility];  Service: Urology;  Laterality: Left;  . CYSTOSCOPY/URETEROSCOPY/HOLMIUM LASER/STENT PLACEMENT Left 10/20/2016   Procedure: CYSTOSCOPY/URETEROSCOPY/HOLMIUM LASER/STENT PLACEMENT;  Surgeon: Vanna ScotlandAshley Brandon, MD;  Location: [facility];  Service: Urology;  Laterality: Left;  . EXTRACORPOREAL SHOCK WAVE LITHOTRIPSY Left 09/20/2015   Procedure: EXTRACORPOREAL SHOCK WAVE LITHOTRIPSY (ESWL);  Surgeon: Hildred LaserBrian James Budzyn, MD;  Location: [facility];  Service: Urology;  Laterality: Left;  . EXTRACORPOREAL SHOCK WAVE LITHOTRIPSY Left 11/15/2015   Procedure: EXTRACORPOREAL SHOCK WAVE LITHOTRIPSY (ESWL);  Surgeon: Vanna ScotlandAshley Brandon, MD;  Location: [facility];  Service: Urology;  Laterality: Left;  . HOLMIUM LASER APPLICATION Left 09/18/2017   Procedure: HOLMIUM LASER APPLICATION;  Surgeon: Crista ElliotBell, Eugene D III, MD;  [facility] [place];  Service: Urology;  Laterality: Left;  . URETEROSCOPY WITH HOLMIUM LASER LITHOTRIPSY Left 12/05/2015   Procedure: URETEROSCOPY WITH HOLMIUM LASER LITHOTRIPSY;  Surgeon: Hildred LaserBrian James Budzyn, MD;  Location: [facility];  Service: Urology;  Laterality: Left;  . URETEROSCOPY WITH HOLMIUM LASER LITHOTRIPSY Left 01/16/2016   Procedure: URETEROSCOPY WITH HOLMIUM LASER LITHOTRIPSY;  Surgeon: Hildred LaserBrian James Budzyn, MD;  Location: [facility];  Service: Urology;  Laterality: Left;    Prior to Admission medications   Medication Sig Start Date End Date Taking? Authorizing Provider  aspirin EC 81 MG tablet Take 81 mg by mouth daily.    [provider]  atorvastatin (LIPITOR) 10 MG tablet Take 1 tablet (10 mg total) by mouth daily at 6 PM. Patient taking differently: Take 10 mg by mouth every evening.  08/07/16   Reubin MilanBerglund, Laura H, MD  bromocriptine (PARLODEL) 2.5 MG tablet 1/4 tab daily 10/19/18   Romero BellingEllison, Sean, MD  gabapentin (NEURONTIN) 300 MG capsule TAKE 1 CAPSULE BY MOUTH TWICE DAILY AND 2 TABLETS AT BEDTIME Patient taking differently: TAKE 1 CAPSULE  (300mg ) BY MOUTH TWICE DAILY AND 2 TABLETS (600mg ) AT BEDTIME 07/13/17   Reubin MilanBerglund, Laura H, MD  lisinopril (PRINIVIL,ZESTRIL) 20 MG tablet Take 20 mg by mouth daily.  11/26/17   [provider]  nortriptyline (PAMELOR) 25 MG capsule Take by mouth. 02/23/17 02/23/18  [provider]  potassium citrate (UROCIT-K) 10 MEQ (1080 MG) SR tablet Take 2 tablets (20 mEq total) by mouth daily. 10/19/18   Romero BellingEllison, Sean, MD  TRADJENTA 5 MG TABS tablet TAKE 1 TABLET (5 MG TOTAL) BY MOUTH DAILY. 04/12/18   Romero BellingEllison, Sean, MD  traZODone (DESYREL) 50 MG tablet Take 50 mg by mouth at bedtime.  11/26/17 11/26/18  [provider]  vitamin B-12 (CYANOCOBALAMIN) 1000 MCG tablet Take 1,000 mcg by mouth daily.  11/27/17 11/27/18  [provider]     Allergies Cymbalta [duloxetine hcl]  Family History  Problem Relation Age of Onset  . Stroke Father   . Nephrolithiasis Father   . Diabetes type I Maternal Grandfather   . Prostate cancer Neg Hx   . Bladder Cancer Neg Hx     Social History Social History   Tobacco Use  . Smoking status: Never Smoker  . Smokeless tobacco: Never Used  Substance Use Topics  . Alcohol use: No    Alcohol/week: 0.0 standard drinks  . Drug use: No  Review of Systems  Constitutional: No fever/chills Eyes: No visual changes.  ENT: No sore throat. Cardiovascular: Denies chest pain. Respiratory: Denies shortness of breath. Gastrointestinal: As above Genitourinary: Negative for dysuria. Musculoskeletal: Negative for back pain. Skin: Negative for rash. Neurological: Negative for headache   ____________________________________________   PHYSICAL EXAM:  VITAL SIGNS: ED Triage Vitals  Enc Vitals Group     BP 10/23/18 1840 129/70     Pulse Rate 10/23/18 1840 62     Resp 10/23/18 1840 16     Temp 10/23/18 1840 98 F (36.7 C)     Temp Source 10/23/18 1840 Oral     SpO2 10/23/18 1840 98 %     Weight 10/23/18 1839 98 kg (216 lb)      Height 10/23/18 1839 1.829 m (6')     Head Circumference --      Peak Flow --      Pain Score 10/23/18 1846 0     Pain Loc --      Pain Edu? --      Excl. in GC? --     Constitutional: Alert and oriented. No acute distress.  Eyes: Conjunctivae are normal.   Nose: No congestion/rhinnorhea. Mouth/Throat: Mucous membranes are moist.    Cardiovascular: Normal rate, regular rhythm. Grossly normal heart sounds.  Good peripheral circulation. Respiratory: Normal respiratory effort.  No retractions. Lungs CTAB. Gastrointestinal: Soft and nontender. No distention.   Musculoskeletal: No lower extremity tenderness nor edema.  Warm and well perfused Neurologic:  Normal speech and language. No gross focal neurologic deficits are appreciated.  Skin:  Skin is warm, dry and intact. No rash noted. Psychiatric: Mood and affect are normal. Speech and behavior are normal.  ____________________________________________   LABS (all labs ordered are listed, but only abnormal results are displayed)  Labs Reviewed  BASIC METABOLIC PANEL - Abnormal; Notable for the following components:      Result Value   Glucose, Bld 158 (*)    BUN 21 (*)    Creatinine, Ser 1.42 (*)    GFR calc non Af Amer 56 (*)    All other components within normal limits  GLUCOSE, CAPILLARY - Abnormal; Notable for the following components:   Glucose-Capillary 139 (*)    All other components within normal limits  CBC  URINALYSIS, COMPLETE (UACMP) WITH MICROSCOPIC  CBG MONITORING, ED   ____________________________________________  EKG  ED ECG REPORT I, Jene Every, the attending physician, personally viewed and interpreted this ECG.  Date: 10/23/2018  Rhythm: normal sinus rhythm QRS Axis: normal Intervals: normal ST/T Wave abnormalities: normal Narrative Interpretation: no evidence of acute  ischemia  ____________________________________________  RADIOLOGY  None ____________________________________________   PROCEDURES  Procedure(s) performed: No  Procedures   Critical Care performed: No ____________________________________________   INITIAL IMPRESSION / ASSESSMENT AND PLAN / ED COURSE  Pertinent labs & imaging results that were available during my care of the patient were reviewed by me and considered in my medical decision making (see chart for details).  Patient well-appearing in no acute distress.  He appears to be feeling significantly better at this time.  He reports he does still feel mildly nauseated.  Lab work is unremarkable.  Exam is normal.  EKG is benign.  Vital signs are normal here, blood pressure is stable.  We will treat with p.o. Zofran, reevaluate to see if he feels better.  If not may need IV fluids further work-up  Patient became impatient and decided to just take Zofran 1 to  go home because he reports he feels better.  Does not want any further work-up.  He has realized that he has been taking a full bromocriptine pill as opposed to a quarter like he is supposed to    ____________________________________________   FINAL CLINICAL IMPRESSION(S) / ED DIAGNOSES  Final diagnoses:  Near syncope  Medication side effect        Note:  This document was prepared using Dragon voice recognition software and may include unintentional dictation errors.    Jene Every, MD 10/23/18 2226    Jene Every, MD 10/23/18 2226

## 2018-10-23 NOTE — ED Notes (Signed)
Pt states went to the bathroom and now feels better and would like to be discharged.

## 2018-10-23 NOTE — ED Triage Notes (Signed)
Pt c/o hypotension approx 1 hour ago. Reports systolic 90's over 60's diastolic. Reports that he has kidney failure and hypertension. Near syncope. Pt alert and oriented X4, active, cooperative, pt in NAD. RR even and unlabored, color WNL.

## 2018-10-23 NOTE — ED Notes (Signed)
ED Provider at bedside. 

## 2018-10-23 NOTE — ED Notes (Signed)
Pt to the ER for an episode of hypotension. Pt has stated he has been nauseated and feeling dizzy but his endocrinologist just started him on parlodel 3 days ago and he was told it would make him nauseated. Verified that pt is to take 1/4 of a tab. Pt states he has been taking an entire pill for the last 3 days and now he understands why he has felt bad. Pt BP is now WNL. Pt states he is ready to go home.

## 2018-11-18 ENCOUNTER — Telehealth: Payer: Self-pay | Admitting: Endocrinology

## 2018-11-18 MED ORDER — SITAGLIPTIN PHOSPHATE 100 MG PO TABS: 100.0000 mg | ORAL_TABLET | Freq: Every day | ORAL | 3 refills | Status: DC

## 2018-11-18 NOTE — Telephone Encounter (Signed)
Called pt to make aware. Verbalized acceptance and understanding. 

## 2018-11-18 NOTE — Telephone Encounter (Signed)
please call patient: Ins prefers [place] over tradjenta.  I have sent a prescription to your pharmacy.  I'll see you next time.

## 2018-11-18 NOTE — Telephone Encounter (Signed)
Do a PA or find out what is covered please advise

## 2018-11-18 NOTE — Telephone Encounter (Signed)
Do they cover januvia or onglyza without PA?

## 2018-11-18 NOTE — Telephone Encounter (Signed)
[facility] and stated that his insurance is rejecting his prescription for Tradjenta. He is not sure if he needs to try an alternative medication . He would like Dr Everardo AllEllison to start a PA for his medication   Please advise

## 2018-11-29 NOTE — Telephone Encounter (Signed)
Just checked paperwork and cover my meds and do not see anything

## 2018-11-29 NOTE — Telephone Encounter (Signed)
Please do PA (either med is ok with me).

## 2018-11-29 NOTE — Telephone Encounter (Signed)
I have not

## 2018-11-29 NOTE — Telephone Encounter (Signed)
According to pt insurance plan (Medicare):   Onglyza is considered "step therapy for new starts only" (ST-NS, RM, QL 31/31), available by retail or mail with quantity limit of 31 per 31 days  Januvia (PA, RM, QL 31/31) requires PA for new starts only available by retail or mail with quantity limit of 31 per 31 days  Medicare Codes:  Age: Must Meet Age Requirement B/D: Part D Vs. Part B Prior Authorization Required DL: Dispensing Limit- Drug may be limited to a one-month supply F: Male Only LD: Limited Distribution M: Male Only PA: Prior Authorization Required PA-NS: Prior Authorization Required for New Starts Only QL: Quantity Limit RM: Available Via Retail and Mail Order RO: Available Via Retail Only SP: Available Via Specialty Pharmacy Only ST: Step Therapy ST-NS: Step Therapy Required for New Starts Only

## 2018-11-29 NOTE — Telephone Encounter (Signed)
Before I dive in to this, have either of you addressed Dr. George HughEllison's question?

## 2018-12-02 NOTE — Telephone Encounter (Signed)
Pt returned call. Provided following information to complete PA: Rx BIN: [id number] Rx PCN: [id number] Rx GRP: [id number] Member ID: [id number] Grp# [id number] Issuer # [phone] [employer]  PA initiated today through Cover My Meds Key: [id number] - PA Case ID: [id number] for Onglyza 5mg . Will await insurance response re: approval/denial.

## 2018-12-02 NOTE — Telephone Encounter (Signed)
PA attempted for Onglyza but received rejection stating pt is no longer eligible/covered. Called pt to inform. Advised with the new year, we will need Rx BIN, Rx PCN, Rx GRP, Rx ID and Member ID to complete PA. Ideally, would like to have a copy of his recent insurance card. States he will need to talk to his wife. Will call back. PA remains on hold pending this information

## 2018-12-06 MED ORDER — SAXAGLIPTIN HCL 5 MG PO TABS: 5.0000 mg | ORAL_TABLET | Freq: Every day | ORAL | 3 refills | Status: DC

## 2018-12-06 NOTE — Addendum Note (Signed)
Addended by: Romero Belling on: 12/06/2018 10:00 AM   Modules accepted: Orders

## 2018-12-06 NOTE — Telephone Encounter (Signed)
Received notification from [employer] indicating Onglyza 5mg  has been approved 12/04/18 through 12/04/19. Letter placed on Dr. George Hugh desk for review. Asked that he also write Rx to send to pharmacy. Will await his response.

## 2018-12-06 NOTE — Telephone Encounter (Signed)
Ok, I have sent a prescription to your pharmacy 

## 2018-12-13 DIAGNOSIS — M542 Cervicalgia: Secondary | ICD-10-CM | POA: Diagnosis not present

## 2018-12-13 DIAGNOSIS — I10 Essential (primary) hypertension: Secondary | ICD-10-CM | POA: Diagnosis not present

## 2018-12-13 DIAGNOSIS — M545 Low back pain: Secondary | ICD-10-CM | POA: Diagnosis not present

## 2018-12-13 DIAGNOSIS — Z9181 History of falling: Secondary | ICD-10-CM | POA: Diagnosis not present

## 2018-12-14 ENCOUNTER — Telehealth: Payer: Self-pay | Admitting: Endocrinology

## 2018-12-14 NOTE — Telephone Encounter (Signed)
Per [facility] "Caller states he went be pharmacy to pick up his medication and the onglyza cost is 100 at the pharmacy. Caller cannot afford that cost. Could the office call in [place] instead. Please call the caller back."

## 2018-12-14 NOTE — Telephone Encounter (Signed)
Please advise 

## 2018-12-14 NOTE — Telephone Encounter (Signed)
PA was approved last week.  Please offer discount card to pt, which we have.

## 2018-12-14 NOTE — Telephone Encounter (Signed)
Called pt and informed about discount card. Placed in envelope at the front desk for pick up. States he will come by at his earliest convenience and pick up.

## 2018-12-17 MED ORDER — GLIMEPIRIDE 1 MG PO TABS: 1.0000 mg | ORAL_TABLET | Freq: Every day | ORAL | 11 refills | Status: AC

## 2018-12-17 NOTE — Addendum Note (Signed)
Addended by: Romero Belling on: 12/17/2018 02:52 PM   Modules accepted: Orders

## 2018-12-17 NOTE — Telephone Encounter (Signed)
Please change onglyza to "glimepiride."  I have sent a prescription to your pharmacy.  I'll see you next week, as sched.

## 2018-12-17 NOTE — Telephone Encounter (Signed)
Patient picked up discount card this morning and pharmacy informed him that since he has two insurance's it will not be active to use. Please Advise with patient on any other options. Patient stated he will run out of the RX this weekend.

## 2018-12-17 NOTE — Telephone Encounter (Signed)
Please advise 

## 2018-12-20 NOTE — Telephone Encounter (Signed)
Pt returned call. Informed of Dr. George Hugh orders. Advised to f/u with [facility] re: status of pick up. Verbalized acceptance and understanding.

## 2018-12-21 ENCOUNTER — Ambulatory Visit: Payer: Medicare Other | Admitting: Endocrinology

## 2018-12-21 DIAGNOSIS — Z0289 Encounter for other administrative examinations: Secondary | ICD-10-CM

## 2018-12-27 DIAGNOSIS — E1140 Type 2 diabetes mellitus with diabetic neuropathy, unspecified: Secondary | ICD-10-CM | POA: Diagnosis not present

## 2018-12-27 DIAGNOSIS — M159 Polyosteoarthritis, unspecified: Secondary | ICD-10-CM | POA: Diagnosis not present

## 2018-12-27 DIAGNOSIS — J069 Acute upper respiratory infection, unspecified: Secondary | ICD-10-CM | POA: Diagnosis not present

## 2018-12-27 DIAGNOSIS — I10 Essential (primary) hypertension: Secondary | ICD-10-CM | POA: Diagnosis not present

## 2019-01-03 ENCOUNTER — Ambulatory Visit: Payer: Medicare Other | Admitting: Endocrinology

## 2019-01-12 ENCOUNTER — Ambulatory Visit: Payer: 59 | Attending: Internal Medicine

## 2019-02-25 DIAGNOSIS — I129 Hypertensive chronic kidney disease with stage 1 through stage 4 chronic kidney disease, or unspecified chronic kidney disease: Secondary | ICD-10-CM | POA: Diagnosis not present

## 2019-02-25 DIAGNOSIS — D631 Anemia in chronic kidney disease: Secondary | ICD-10-CM | POA: Diagnosis not present

## 2019-02-25 DIAGNOSIS — N261 Atrophy of kidney (terminal): Secondary | ICD-10-CM | POA: Diagnosis not present

## 2019-02-25 DIAGNOSIS — N182 Chronic kidney disease, stage 2 (mild): Secondary | ICD-10-CM | POA: Diagnosis not present

## 2019-02-25 DIAGNOSIS — R109 Unspecified abdominal pain: Secondary | ICD-10-CM | POA: Diagnosis not present

## 2019-02-25 DIAGNOSIS — N2581 Secondary hyperparathyroidism of renal origin: Secondary | ICD-10-CM | POA: Diagnosis not present

## 2019-02-25 DIAGNOSIS — E785 Hyperlipidemia, unspecified: Secondary | ICD-10-CM | POA: Diagnosis not present

## 2019-02-25 DIAGNOSIS — N200 Calculus of kidney: Secondary | ICD-10-CM | POA: Diagnosis not present

## 2019-02-25 DIAGNOSIS — E1129 Type 2 diabetes mellitus with other diabetic kidney complication: Secondary | ICD-10-CM | POA: Diagnosis not present

## 2019-04-27 DIAGNOSIS — E1140 Type 2 diabetes mellitus with diabetic neuropathy, unspecified: Secondary | ICD-10-CM | POA: Diagnosis not present

## 2019-04-27 DIAGNOSIS — M159 Polyosteoarthritis, unspecified: Secondary | ICD-10-CM | POA: Diagnosis not present

## 2019-04-27 DIAGNOSIS — R635 Abnormal weight gain: Secondary | ICD-10-CM | POA: Diagnosis not present

## 2019-04-27 DIAGNOSIS — I10 Essential (primary) hypertension: Secondary | ICD-10-CM | POA: Diagnosis not present

## 2019-05-20 ENCOUNTER — Telehealth: Payer: Self-pay

## 2019-05-20 NOTE — Telephone Encounter (Signed)
LOV 10/19/18. Per Dr. Ellison, f/u in 2 mo. Called pt to schedule appt. LVM requesting returned call.  

## 2019-06-09 ENCOUNTER — Other Ambulatory Visit: Payer: Self-pay

## 2019-06-13 ENCOUNTER — Telehealth: Payer: Self-pay | Admitting: Endocrinology

## 2019-06-13 ENCOUNTER — Other Ambulatory Visit: Payer: Self-pay

## 2019-06-13 ENCOUNTER — Ambulatory Visit: Payer: 59 | Admitting: Endocrinology

## 2019-06-13 NOTE — Telephone Encounter (Signed)
Patient stormed out angry this morning because he arrived early for his 1 o'clock appointment and Dr.Ellison did not have the availability to see him any earlier per Ammie and said he would not be back.

## 2019-06-16 NOTE — Telephone Encounter (Signed)
Called to check in on patient to see what we are to do next and he said his PCP will be reaching out to our office.

## 2019-06-16 NOTE — Telephone Encounter (Signed)
It appears pt has requested a transfer of care to [facility]. From CareEverywhere:  Telephone Encounter - Yvonne Kendall, [place] - 06/15/2019 4:37 PM EDT Patient has been notified of referral being sent. Patient verbalized understanding. No further questions at this time.   Electronically signed by Yvonne Kendall, CMA at 06/15/2019 4:37 PM EDT  Back to top of Miscellaneous Notes Telephone Encounter - Azzie Glatter, MD - 06/15/2019 4:21 PM EDT OK to change to Endocrinology here   Electronically signed by Azzie Glatter, MD at 06/15/2019 4:21 PM EDT  Back to top of Miscellaneous Notes Telephone Encounter Yvonne Kendall, [place] - 06/15/2019 3:07 PM EDT Please advise okay to switch to [facility] Endo?    Electronically signed by Yvonne Kendall, [place] at 06/15/2019 3:07 PM EDT  Back to top of Miscellaneous Notes Telephone Encounter - Jacqulyn Liner - 06/15/2019 10:06 AM EDT  Patient calls and states that he would like to transfer care from Endo from [place] to Endo here at [facility].  Referral needed .

## 2019-06-16 NOTE — Telephone Encounter (Signed)
OK 

## 2019-06-16 NOTE — Telephone Encounter (Signed)
Ok Self dismissal can be processed then

## 2019-07-07 DIAGNOSIS — N182 Chronic kidney disease, stage 2 (mild): Secondary | ICD-10-CM | POA: Diagnosis not present

## 2019-07-07 DIAGNOSIS — Z125 Encounter for screening for malignant neoplasm of prostate: Secondary | ICD-10-CM | POA: Diagnosis not present

## 2019-07-07 DIAGNOSIS — R002 Palpitations: Secondary | ICD-10-CM | POA: Diagnosis not present

## 2019-07-07 DIAGNOSIS — Z0000 Encounter for general adult medical examination without abnormal findings: Secondary | ICD-10-CM | POA: Diagnosis not present

## 2019-07-07 DIAGNOSIS — E1140 Type 2 diabetes mellitus with diabetic neuropathy, unspecified: Secondary | ICD-10-CM | POA: Diagnosis not present

## 2019-07-07 DIAGNOSIS — I10 Essential (primary) hypertension: Secondary | ICD-10-CM | POA: Diagnosis not present

## 2019-07-07 DIAGNOSIS — M159 Polyosteoarthritis, unspecified: Secondary | ICD-10-CM | POA: Diagnosis not present

## 2019-07-07 DIAGNOSIS — R0789 Other chest pain: Secondary | ICD-10-CM | POA: Diagnosis not present

## 2019-07-14 DIAGNOSIS — E1140 Type 2 diabetes mellitus with diabetic neuropathy, unspecified: Secondary | ICD-10-CM | POA: Diagnosis not present

## 2019-07-14 DIAGNOSIS — R079 Chest pain, unspecified: Secondary | ICD-10-CM | POA: Diagnosis not present

## 2019-07-14 DIAGNOSIS — R002 Palpitations: Secondary | ICD-10-CM | POA: Diagnosis not present

## 2019-07-14 DIAGNOSIS — I10 Essential (primary) hypertension: Secondary | ICD-10-CM | POA: Diagnosis not present

## 2019-07-20 DIAGNOSIS — R079 Chest pain, unspecified: Secondary | ICD-10-CM | POA: Diagnosis not present

## 2019-07-25 DIAGNOSIS — R002 Palpitations: Secondary | ICD-10-CM | POA: Diagnosis not present

## 2019-07-26 DIAGNOSIS — R079 Chest pain, unspecified: Secondary | ICD-10-CM | POA: Diagnosis not present

## 2019-07-26 DIAGNOSIS — I10 Essential (primary) hypertension: Secondary | ICD-10-CM | POA: Diagnosis not present

## 2019-07-26 DIAGNOSIS — E1140 Type 2 diabetes mellitus with diabetic neuropathy, unspecified: Secondary | ICD-10-CM | POA: Diagnosis not present

## 2019-07-26 DIAGNOSIS — R002 Palpitations: Secondary | ICD-10-CM | POA: Diagnosis not present

## 2019-08-04 DIAGNOSIS — I10 Essential (primary) hypertension: Secondary | ICD-10-CM | POA: Diagnosis not present

## 2019-08-04 DIAGNOSIS — E1121 Type 2 diabetes mellitus with diabetic nephropathy: Secondary | ICD-10-CM | POA: Diagnosis not present

## 2019-08-04 DIAGNOSIS — N183 Chronic kidney disease, stage 3 (moderate): Secondary | ICD-10-CM | POA: Diagnosis not present

## 2019-08-04 DIAGNOSIS — E1159 Type 2 diabetes mellitus with other circulatory complications: Secondary | ICD-10-CM | POA: Diagnosis not present

## 2019-08-04 DIAGNOSIS — E1122 Type 2 diabetes mellitus with diabetic chronic kidney disease: Secondary | ICD-10-CM | POA: Diagnosis not present

## 2019-08-04 DIAGNOSIS — E1169 Type 2 diabetes mellitus with other specified complication: Secondary | ICD-10-CM | POA: Diagnosis not present

## 2019-10-19 ENCOUNTER — Other Ambulatory Visit: Payer: Self-pay | Admitting: Endocrinology

## 2019-10-22 ENCOUNTER — Emergency Department
Admission: EM | Admit: 2019-10-22 | Discharge: 2019-10-22 | Disposition: A | Discharge status: Home / Self Care | Payer: 59 | Attending: Emergency Medicine | Admitting: Emergency Medicine

## 2019-10-22 ENCOUNTER — Encounter: Payer: Self-pay | Admitting: Intensive Care

## 2019-10-22 ENCOUNTER — Other Ambulatory Visit: Payer: Self-pay

## 2019-10-22 DIAGNOSIS — Z5321 Procedure and treatment not carried out due to patient leaving prior to being seen by health care provider: Secondary | ICD-10-CM | POA: Diagnosis not present

## 2019-10-22 DIAGNOSIS — M7918 Myalgia, other site: Secondary | ICD-10-CM | POA: Diagnosis not present

## 2019-10-22 DIAGNOSIS — E1165 Type 2 diabetes mellitus with hyperglycemia: Secondary | ICD-10-CM | POA: Diagnosis present

## 2019-10-22 LAB — GLUCOSE, CAPILLARY: Glucose-Capillary: 380 mg/dL — ABNORMAL HIGH (ref 70–99)

## 2019-10-22 NOTE — ED Notes (Signed)
First Nurse Note: Pt to ED, states that his blood sugar has been high. Pt is in NAD.

## 2019-10-22 NOTE — ED Notes (Signed)
Patient informed Animal nutritionist he was leaving and not staying to be seen.

## 2019-10-22 NOTE — ED Notes (Signed)
Patient is unsure if he wants IV, blood work drawn, and fluids at this time. Reports he is going to go talk to his wife about staying or leaving. Spoke with MD [facility] who verbally relayed start IV and fluids. Patient wants insulin out in triage and this RN explained I cannot give him insulin in triage but until he gets to a room and sees a doctor.

## 2019-10-22 NOTE — ED Triage Notes (Signed)
Patient reports blood sugar in 500s at home. Reports he only has 1 kidney. Chronic generalized pain all over. Denies any new pain. Ambulatory with no problems. Reports frequent urinary X few days

## 2019-10-24 DIAGNOSIS — E1159 Type 2 diabetes mellitus with other circulatory complications: Secondary | ICD-10-CM | POA: Diagnosis not present

## 2019-10-24 DIAGNOSIS — N1831 Chronic kidney disease, stage 3a: Secondary | ICD-10-CM | POA: Diagnosis not present

## 2019-10-24 DIAGNOSIS — E1121 Type 2 diabetes mellitus with diabetic nephropathy: Secondary | ICD-10-CM | POA: Diagnosis not present

## 2019-10-24 DIAGNOSIS — E1169 Type 2 diabetes mellitus with other specified complication: Secondary | ICD-10-CM | POA: Diagnosis not present

## 2019-11-10 DIAGNOSIS — N1831 Chronic kidney disease, stage 3a: Secondary | ICD-10-CM | POA: Diagnosis not present

## 2019-11-10 DIAGNOSIS — E1169 Type 2 diabetes mellitus with other specified complication: Secondary | ICD-10-CM | POA: Diagnosis not present

## 2019-11-10 DIAGNOSIS — E1159 Type 2 diabetes mellitus with other circulatory complications: Secondary | ICD-10-CM | POA: Diagnosis not present

## 2019-11-10 DIAGNOSIS — E1121 Type 2 diabetes mellitus with diabetic nephropathy: Secondary | ICD-10-CM | POA: Diagnosis not present

## 2019-11-28 DIAGNOSIS — K6289 Other specified diseases of anus and rectum: Secondary | ICD-10-CM | POA: Diagnosis not present

## 2019-11-28 DIAGNOSIS — K649 Unspecified hemorrhoids: Secondary | ICD-10-CM | POA: Diagnosis not present

## 2019-11-28 DIAGNOSIS — K5900 Constipation, unspecified: Secondary | ICD-10-CM | POA: Diagnosis not present

## 2019-11-29 DIAGNOSIS — N1831 Chronic kidney disease, stage 3a: Secondary | ICD-10-CM | POA: Diagnosis not present

## 2019-11-29 DIAGNOSIS — E876 Hypokalemia: Secondary | ICD-10-CM | POA: Diagnosis not present

## 2019-11-29 DIAGNOSIS — E1122 Type 2 diabetes mellitus with diabetic chronic kidney disease: Secondary | ICD-10-CM | POA: Diagnosis not present

## 2019-11-29 DIAGNOSIS — I129 Hypertensive chronic kidney disease with stage 1 through stage 4 chronic kidney disease, or unspecified chronic kidney disease: Secondary | ICD-10-CM | POA: Diagnosis not present

## 2019-12-01 DIAGNOSIS — T50915A Adverse effect of multiple unspecified drugs, medicaments and biological substances, initial encounter: Secondary | ICD-10-CM | POA: Diagnosis not present

## 2019-12-01 DIAGNOSIS — E1121 Type 2 diabetes mellitus with diabetic nephropathy: Secondary | ICD-10-CM | POA: Diagnosis not present

## 2019-12-01 DIAGNOSIS — R21 Rash and other nonspecific skin eruption: Secondary | ICD-10-CM | POA: Diagnosis not present

## 2019-12-01 DIAGNOSIS — N1831 Chronic kidney disease, stage 3a: Secondary | ICD-10-CM | POA: Diagnosis not present

## 2019-12-08 ENCOUNTER — Other Ambulatory Visit: Payer: Self-pay | Admitting: Nephrology

## 2019-12-08 DIAGNOSIS — N1831 Chronic kidney disease, stage 3a: Secondary | ICD-10-CM

## 2019-12-08 DIAGNOSIS — E1122 Type 2 diabetes mellitus with diabetic chronic kidney disease: Secondary | ICD-10-CM

## 2019-12-15 ENCOUNTER — Ambulatory Visit
Admission: RE | Admit: 2019-12-15 | Discharge: 2019-12-15 | Disposition: A | Discharge status: Home / Self Care | Payer: 59 | Source: Ambulatory Visit | Attending: Nephrology | Admitting: Nephrology

## 2019-12-15 ENCOUNTER — Other Ambulatory Visit: Payer: Self-pay

## 2019-12-15 DIAGNOSIS — N183 Chronic kidney disease, stage 3 unspecified: Secondary | ICD-10-CM | POA: Insufficient documentation

## 2019-12-15 DIAGNOSIS — N1831 Chronic kidney disease, stage 3a: Secondary | ICD-10-CM | POA: Insufficient documentation

## 2019-12-15 DIAGNOSIS — E1122 Type 2 diabetes mellitus with diabetic chronic kidney disease: Secondary | ICD-10-CM | POA: Insufficient documentation

## 2019-12-27 DIAGNOSIS — N1831 Chronic kidney disease, stage 3a: Secondary | ICD-10-CM | POA: Diagnosis not present

## 2019-12-27 DIAGNOSIS — E1122 Type 2 diabetes mellitus with diabetic chronic kidney disease: Secondary | ICD-10-CM | POA: Diagnosis not present

## 2019-12-27 DIAGNOSIS — I129 Hypertensive chronic kidney disease with stage 1 through stage 4 chronic kidney disease, or unspecified chronic kidney disease: Secondary | ICD-10-CM | POA: Diagnosis not present

## 2019-12-27 DIAGNOSIS — E876 Hypokalemia: Secondary | ICD-10-CM | POA: Diagnosis not present

## 2020-01-10 DIAGNOSIS — K5900 Constipation, unspecified: Secondary | ICD-10-CM | POA: Diagnosis not present

## 2020-01-10 DIAGNOSIS — Z1211 Encounter for screening for malignant neoplasm of colon: Secondary | ICD-10-CM | POA: Diagnosis not present

## 2020-01-10 DIAGNOSIS — K649 Unspecified hemorrhoids: Secondary | ICD-10-CM | POA: Diagnosis not present

## 2020-01-10 DIAGNOSIS — N182 Chronic kidney disease, stage 2 (mild): Secondary | ICD-10-CM | POA: Diagnosis not present

## 2020-01-31 DIAGNOSIS — Z01812 Encounter for preprocedural laboratory examination: Secondary | ICD-10-CM | POA: Diagnosis not present

## 2020-02-09 ENCOUNTER — Other Ambulatory Visit: Payer: Self-pay | Admitting: "Endocrinology

## 2020-02-09 DIAGNOSIS — E1159 Type 2 diabetes mellitus with other circulatory complications: Secondary | ICD-10-CM | POA: Diagnosis not present

## 2020-02-09 DIAGNOSIS — E1121 Type 2 diabetes mellitus with diabetic nephropathy: Secondary | ICD-10-CM | POA: Diagnosis not present

## 2020-02-09 DIAGNOSIS — E291 Testicular hypofunction: Secondary | ICD-10-CM | POA: Diagnosis not present

## 2020-02-09 DIAGNOSIS — E1169 Type 2 diabetes mellitus with other specified complication: Secondary | ICD-10-CM | POA: Diagnosis not present

## 2020-02-09 DIAGNOSIS — R948 Abnormal results of function studies of other organs and systems: Secondary | ICD-10-CM | POA: Diagnosis not present

## 2020-02-09 DIAGNOSIS — N1831 Chronic kidney disease, stage 3a: Secondary | ICD-10-CM | POA: Diagnosis not present

## 2020-02-09 DIAGNOSIS — E785 Hyperlipidemia, unspecified: Secondary | ICD-10-CM | POA: Diagnosis not present

## 2020-02-09 DIAGNOSIS — I10 Essential (primary) hypertension: Secondary | ICD-10-CM | POA: Diagnosis not present

## 2020-04-02 DIAGNOSIS — I129 Hypertensive chronic kidney disease with stage 1 through stage 4 chronic kidney disease, or unspecified chronic kidney disease: Secondary | ICD-10-CM | POA: Diagnosis not present

## 2020-04-02 DIAGNOSIS — R8290 Unspecified abnormal findings in urine: Secondary | ICD-10-CM | POA: Diagnosis not present

## 2020-04-02 DIAGNOSIS — E1122 Type 2 diabetes mellitus with diabetic chronic kidney disease: Secondary | ICD-10-CM | POA: Diagnosis not present

## 2020-04-02 DIAGNOSIS — E876 Hypokalemia: Secondary | ICD-10-CM | POA: Diagnosis not present

## 2020-04-02 DIAGNOSIS — N1831 Chronic kidney disease, stage 3a: Secondary | ICD-10-CM | POA: Diagnosis not present

## 2020-04-11 ENCOUNTER — Other Ambulatory Visit: Payer: Self-pay | Admitting: Internal Medicine

## 2020-04-11 DIAGNOSIS — E1121 Type 2 diabetes mellitus with diabetic nephropathy: Secondary | ICD-10-CM | POA: Diagnosis not present

## 2020-04-11 DIAGNOSIS — M722 Plantar fascial fibromatosis: Secondary | ICD-10-CM | POA: Diagnosis not present

## 2020-04-11 DIAGNOSIS — I10 Essential (primary) hypertension: Secondary | ICD-10-CM | POA: Diagnosis not present

## 2020-04-11 DIAGNOSIS — M159 Polyosteoarthritis, unspecified: Secondary | ICD-10-CM | POA: Diagnosis not present

## 2020-04-12 ENCOUNTER — Ambulatory Visit
Admission: EM | Admit: 2020-04-12 | Discharge: 2020-04-12 | Disposition: A | Discharge status: Home / Self Care | Payer: 59 | Attending: Family Medicine | Admitting: Family Medicine

## 2020-04-12 ENCOUNTER — Other Ambulatory Visit: Payer: Self-pay

## 2020-04-12 DIAGNOSIS — T310 Burns involving less than 10% of body surface: Secondary | ICD-10-CM

## 2020-04-12 DIAGNOSIS — M79604 Pain in right leg: Secondary | ICD-10-CM | POA: Diagnosis not present

## 2020-04-12 MED ORDER — SULFAMETHOXAZOLE-TRIMETHOPRIM 800-160 MG PO TABS: 1.0000 | ORAL_TABLET | Freq: Two times a day (BID) | ORAL | 0 refills | Status: AC

## 2020-04-12 MED ORDER — SILVER SULFADIAZINE 1 % EX CREA: 1.0000 "application " | TOPICAL_CREAM | Freq: Every day | CUTANEOUS | 0 refills | Status: AC
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
## 2020-04-25 ENCOUNTER — Other Ambulatory Visit: Payer: Self-pay | Admitting: Internal Medicine

## 2020-05-31 ENCOUNTER — Other Ambulatory Visit: Payer: Self-pay | Admitting: "Endocrinology

## 2020-06-20 DIAGNOSIS — N1831 Chronic kidney disease, stage 3a: Secondary | ICD-10-CM | POA: Diagnosis not present

## 2020-06-20 DIAGNOSIS — E1122 Type 2 diabetes mellitus with diabetic chronic kidney disease: Secondary | ICD-10-CM | POA: Diagnosis not present

## 2020-06-20 DIAGNOSIS — E1159 Type 2 diabetes mellitus with other circulatory complications: Secondary | ICD-10-CM | POA: Diagnosis not present

## 2020-06-20 DIAGNOSIS — E1169 Type 2 diabetes mellitus with other specified complication: Secondary | ICD-10-CM | POA: Diagnosis not present

## 2020-06-26 ENCOUNTER — Other Ambulatory Visit (HOSPITAL_COMMUNITY): Payer: Self-pay | Admitting: "Endocrinology

## 2020-07-23 ENCOUNTER — Other Ambulatory Visit: Payer: Self-pay | Admitting: Internal Medicine

## 2020-08-15 ENCOUNTER — Other Ambulatory Visit: Payer: Self-pay | Admitting: Urology

## 2020-08-28 DIAGNOSIS — E1122 Type 2 diabetes mellitus with diabetic chronic kidney disease: Secondary | ICD-10-CM | POA: Diagnosis not present

## 2020-08-28 DIAGNOSIS — Z0000 Encounter for general adult medical examination without abnormal findings: Secondary | ICD-10-CM | POA: Diagnosis not present

## 2020-08-28 DIAGNOSIS — E785 Hyperlipidemia, unspecified: Secondary | ICD-10-CM | POA: Diagnosis not present

## 2020-08-28 DIAGNOSIS — E1169 Type 2 diabetes mellitus with other specified complication: Secondary | ICD-10-CM | POA: Diagnosis not present

## 2020-08-28 DIAGNOSIS — N1831 Chronic kidney disease, stage 3a: Secondary | ICD-10-CM | POA: Diagnosis not present

## 2020-08-28 DIAGNOSIS — Z87442 Personal history of urinary calculi: Secondary | ICD-10-CM | POA: Diagnosis not present

## 2020-08-28 DIAGNOSIS — I10 Essential (primary) hypertension: Secondary | ICD-10-CM | POA: Diagnosis not present

## 2020-08-30 ENCOUNTER — Other Ambulatory Visit: Payer: Self-pay | Admitting: Internal Medicine

## 2020-09-08 DIAGNOSIS — H5203 Hypermetropia, bilateral: Secondary | ICD-10-CM | POA: Diagnosis not present

## 2020-10-08 DIAGNOSIS — E876 Hypokalemia: Secondary | ICD-10-CM | POA: Diagnosis not present

## 2020-10-08 DIAGNOSIS — E1122 Type 2 diabetes mellitus with diabetic chronic kidney disease: Secondary | ICD-10-CM | POA: Diagnosis not present

## 2020-10-08 DIAGNOSIS — N1831 Chronic kidney disease, stage 3a: Secondary | ICD-10-CM | POA: Diagnosis not present

## 2020-10-08 DIAGNOSIS — I129 Hypertensive chronic kidney disease with stage 1 through stage 4 chronic kidney disease, or unspecified chronic kidney disease: Secondary | ICD-10-CM | POA: Diagnosis not present

## 2020-10-24 ENCOUNTER — Other Ambulatory Visit: Payer: Self-pay | Admitting: Internal Medicine

## 2020-10-24 DIAGNOSIS — M5136 Other intervertebral disc degeneration, lumbar region: Secondary | ICD-10-CM | POA: Diagnosis not present

## 2020-10-24 DIAGNOSIS — E1122 Type 2 diabetes mellitus with diabetic chronic kidney disease: Secondary | ICD-10-CM | POA: Diagnosis not present

## 2020-10-24 DIAGNOSIS — I10 Essential (primary) hypertension: Secondary | ICD-10-CM | POA: Diagnosis not present

## 2020-10-24 DIAGNOSIS — M549 Dorsalgia, unspecified: Secondary | ICD-10-CM | POA: Diagnosis not present

## 2020-11-12 ENCOUNTER — Other Ambulatory Visit: Payer: Self-pay | Admitting: Internal Medicine

## 2020-11-13 ENCOUNTER — Other Ambulatory Visit: Payer: Self-pay | Admitting: "Endocrinology

## 2020-11-14 ENCOUNTER — Other Ambulatory Visit: Payer: Self-pay | Admitting: "Endocrinology

## 2020-11-14 DIAGNOSIS — E1159 Type 2 diabetes mellitus with other circulatory complications: Secondary | ICD-10-CM | POA: Diagnosis not present

## 2020-11-14 DIAGNOSIS — E1169 Type 2 diabetes mellitus with other specified complication: Secondary | ICD-10-CM | POA: Diagnosis not present

## 2020-11-14 DIAGNOSIS — I152 Hypertension secondary to endocrine disorders: Secondary | ICD-10-CM | POA: Diagnosis not present

## 2020-11-14 DIAGNOSIS — E1140 Type 2 diabetes mellitus with diabetic neuropathy, unspecified: Secondary | ICD-10-CM | POA: Diagnosis not present

## 2020-11-28 ENCOUNTER — Other Ambulatory Visit: Payer: Self-pay | Admitting: Internal Medicine

## 2020-12-11 ENCOUNTER — Other Ambulatory Visit: Payer: Self-pay | Admitting: Physician Assistant

## 2020-12-24 ENCOUNTER — Other Ambulatory Visit (HOSPITAL_COMMUNITY): Payer: Self-pay | Admitting: "Endocrinology

## 2020-12-24 MED FILL — FREESTYLE LIBRE 14 DAY SENS: 28 days supply | Qty: 2 | Fill #0

## 2020-12-26 ENCOUNTER — Other Ambulatory Visit: Payer: Self-pay | Admitting: Internal Medicine

## 2020-12-26 DIAGNOSIS — J984 Other disorders of lung: Secondary | ICD-10-CM | POA: Diagnosis not present

## 2020-12-26 DIAGNOSIS — R0681 Apnea, not elsewhere classified: Secondary | ICD-10-CM | POA: Diagnosis not present

## 2020-12-26 DIAGNOSIS — Z20822 Contact with and (suspected) exposure to covid-19: Secondary | ICD-10-CM | POA: Diagnosis not present

## 2020-12-26 DIAGNOSIS — R0683 Snoring: Secondary | ICD-10-CM | POA: Diagnosis not present

## 2020-12-26 DIAGNOSIS — R5381 Other malaise: Secondary | ICD-10-CM | POA: Diagnosis not present

## 2020-12-26 DIAGNOSIS — U071 COVID-19: Secondary | ICD-10-CM | POA: Diagnosis not present

## 2020-12-26 DIAGNOSIS — R053 Chronic cough: Secondary | ICD-10-CM | POA: Diagnosis not present

## 2020-12-26 DIAGNOSIS — N1831 Chronic kidney disease, stage 3a: Secondary | ICD-10-CM | POA: Diagnosis not present

## 2020-12-26 DIAGNOSIS — G4710 Hypersomnia, unspecified: Secondary | ICD-10-CM | POA: Diagnosis not present

## 2020-12-26 DIAGNOSIS — Z981 Arthrodesis status: Secondary | ICD-10-CM | POA: Diagnosis not present

## 2020-12-26 DIAGNOSIS — I10 Essential (primary) hypertension: Secondary | ICD-10-CM | POA: Diagnosis not present

## 2020-12-26 DIAGNOSIS — E1122 Type 2 diabetes mellitus with diabetic chronic kidney disease: Secondary | ICD-10-CM | POA: Diagnosis not present

## 2020-12-26 DIAGNOSIS — R400 Somnolence: Secondary | ICD-10-CM | POA: Diagnosis not present

## 2020-12-26 DIAGNOSIS — R5382 Chronic fatigue, unspecified: Secondary | ICD-10-CM | POA: Diagnosis not present

## 2021-01-14 ENCOUNTER — Other Ambulatory Visit: Payer: Self-pay | Admitting: Internal Medicine

## 2021-01-15 ENCOUNTER — Other Ambulatory Visit: Payer: Self-pay | Admitting: Internal Medicine

## 2021-01-15 DIAGNOSIS — R053 Chronic cough: Secondary | ICD-10-CM

## 2021-01-15 DIAGNOSIS — R5381 Other malaise: Secondary | ICD-10-CM

## 2021-01-15 DIAGNOSIS — R0683 Snoring: Secondary | ICD-10-CM

## 2021-01-15 DIAGNOSIS — G4733 Obstructive sleep apnea (adult) (pediatric): Secondary | ICD-10-CM

## 2021-01-15 DIAGNOSIS — R5382 Chronic fatigue, unspecified: Secondary | ICD-10-CM

## 2021-01-15 DIAGNOSIS — R0681 Apnea, not elsewhere classified: Secondary | ICD-10-CM

## 2021-01-15 DIAGNOSIS — R400 Somnolence: Secondary | ICD-10-CM

## 2021-01-22 ENCOUNTER — Ambulatory Visit
Admission: RE | Admit: 2021-01-22 | Discharge: 2021-01-22 | Disposition: A | Discharge status: Home / Self Care | Payer: 59 | Source: Ambulatory Visit | Attending: Internal Medicine | Admitting: Internal Medicine

## 2021-01-22 ENCOUNTER — Other Ambulatory Visit: Payer: Self-pay

## 2021-01-22 ENCOUNTER — Other Ambulatory Visit: Payer: Self-pay | Admitting: Internal Medicine

## 2021-01-22 DIAGNOSIS — R0683 Snoring: Secondary | ICD-10-CM | POA: Diagnosis not present

## 2021-01-22 DIAGNOSIS — R059 Cough, unspecified: Secondary | ICD-10-CM | POA: Diagnosis not present

## 2021-01-22 DIAGNOSIS — R0681 Apnea, not elsewhere classified: Secondary | ICD-10-CM | POA: Diagnosis not present

## 2021-01-22 DIAGNOSIS — R5381 Other malaise: Secondary | ICD-10-CM

## 2021-01-22 DIAGNOSIS — G4733 Obstructive sleep apnea (adult) (pediatric): Secondary | ICD-10-CM

## 2021-01-22 DIAGNOSIS — R5382 Chronic fatigue, unspecified: Secondary | ICD-10-CM | POA: Diagnosis not present

## 2021-01-22 DIAGNOSIS — R400 Somnolence: Secondary | ICD-10-CM | POA: Insufficient documentation

## 2021-01-22 DIAGNOSIS — R053 Chronic cough: Secondary | ICD-10-CM

## 2021-01-22 DIAGNOSIS — R079 Chest pain, unspecified: Secondary | ICD-10-CM | POA: Diagnosis not present

## 2021-01-22 HISTORY — DX: Disorder of kidney and ureter, unspecified: N28.9

## 2021-02-19 ENCOUNTER — Other Ambulatory Visit: Payer: Self-pay | Admitting: Internal Medicine

## 2021-02-19 DIAGNOSIS — M549 Dorsalgia, unspecified: Secondary | ICD-10-CM | POA: Diagnosis not present

## 2021-02-19 DIAGNOSIS — M5136 Other intervertebral disc degeneration, lumbar region: Secondary | ICD-10-CM | POA: Diagnosis not present

## 2021-02-19 DIAGNOSIS — I10 Essential (primary) hypertension: Secondary | ICD-10-CM | POA: Diagnosis not present

## 2021-02-19 DIAGNOSIS — G8929 Other chronic pain: Secondary | ICD-10-CM | POA: Diagnosis not present

## 2021-02-19 DIAGNOSIS — N1831 Chronic kidney disease, stage 3a: Secondary | ICD-10-CM | POA: Diagnosis not present

## 2021-02-19 DIAGNOSIS — E1122 Type 2 diabetes mellitus with diabetic chronic kidney disease: Secondary | ICD-10-CM | POA: Diagnosis not present

## 2021-02-19 DIAGNOSIS — Z125 Encounter for screening for malignant neoplasm of prostate: Secondary | ICD-10-CM | POA: Diagnosis not present

## 2021-02-25 ENCOUNTER — Other Ambulatory Visit: Payer: Self-pay | Admitting: "Endocrinology

## 2021-02-25 DIAGNOSIS — M159 Polyosteoarthritis, unspecified: Secondary | ICD-10-CM | POA: Diagnosis not present

## 2021-02-25 DIAGNOSIS — Z0000 Encounter for general adult medical examination without abnormal findings: Secondary | ICD-10-CM | POA: Diagnosis not present

## 2021-02-25 DIAGNOSIS — M542 Cervicalgia: Secondary | ICD-10-CM | POA: Diagnosis not present

## 2021-02-25 DIAGNOSIS — E1122 Type 2 diabetes mellitus with diabetic chronic kidney disease: Secondary | ICD-10-CM | POA: Diagnosis not present

## 2021-02-25 DIAGNOSIS — I10 Essential (primary) hypertension: Secondary | ICD-10-CM | POA: Diagnosis not present

## 2021-03-11 ENCOUNTER — Other Ambulatory Visit (HOSPITAL_COMMUNITY): Payer: Self-pay

## 2021-03-11 MED ORDER — POTASSIUM CITRATE ER 10 MEQ (1080 MG) PO TBCR
10.0000 meq | EXTENDED_RELEASE_TABLET | Freq: Three times a day (TID) | ORAL | 1 refills | Status: DC
  Filled 2021-03-11: qty 270, 90d supply, fill #0
  Filled 2021-07-26: qty 9, 3d supply, fill #0
  Filled 2021-07-26: qty 261, 87d supply, fill #0
  Filled 2021-07-26: qty 270, 90d supply, fill #1

## 2021-03-11 MED FILL — Glimepiride Tab 4 MG: ORAL | 90 days supply | Qty: 90 | Fill #0 | Status: AC

## 2021-03-11 MED FILL — Continuous Glucose System Sensor: 28 days supply | Qty: 2 | Fill #0 | Status: AC

## 2021-03-12 ENCOUNTER — Other Ambulatory Visit (HOSPITAL_COMMUNITY): Payer: Self-pay

## 2021-03-14 ENCOUNTER — Other Ambulatory Visit (HOSPITAL_COMMUNITY): Payer: Self-pay

## 2021-03-15 ENCOUNTER — Other Ambulatory Visit (HOSPITAL_COMMUNITY): Payer: Self-pay

## 2021-04-07 DIAGNOSIS — G4733 Obstructive sleep apnea (adult) (pediatric): Secondary | ICD-10-CM | POA: Diagnosis not present

## 2021-04-08 ENCOUNTER — Other Ambulatory Visit: Payer: Self-pay

## 2021-04-08 MED ORDER — TRESIBA FLEXTOUCH 200 UNIT/ML ~~LOC~~ SOPN
PEN_INJECTOR | SUBCUTANEOUS | 6 refills | Status: AC
  Filled 2021-04-08: qty 9, 30d supply, fill #0
  Filled 2021-05-14: qty 9, 30d supply, fill #1
  Filled 2021-08-02: qty 9, 30d supply, fill #2
  Filled 2021-10-02: qty 9, 30d supply, fill #3
  Filled 2021-11-19 – 2021-11-26 (×2): qty 9, 30d supply, fill #4
  Filled 2021-12-24 (×2): qty 9, 30d supply, fill #5
  Filled 2022-01-21: qty 9, 30d supply, fill #6

## 2021-04-08 MED FILL — Dulaglutide Soln Auto-injector 1.5 MG/0.5ML: SUBCUTANEOUS | 28 days supply | Qty: 2 | Fill #0 | Status: AC

## 2021-04-08 MED FILL — Insulin Pen Needle 32 G X 4 MM (1/6" or 5/32"): 30 days supply | Qty: 100 | Fill #0 | Status: CN

## 2021-04-08 MED FILL — Insulin Pen Needle 32 G X 4 MM (1/6" or 5/32"): 60 days supply | Qty: 100 | Fill #0 | Status: CN

## 2021-04-15 ENCOUNTER — Other Ambulatory Visit (HOSPITAL_COMMUNITY): Payer: Self-pay

## 2021-04-15 ENCOUNTER — Other Ambulatory Visit: Payer: Self-pay

## 2021-04-15 DIAGNOSIS — I129 Hypertensive chronic kidney disease with stage 1 through stage 4 chronic kidney disease, or unspecified chronic kidney disease: Secondary | ICD-10-CM | POA: Diagnosis not present

## 2021-04-15 DIAGNOSIS — E1122 Type 2 diabetes mellitus with diabetic chronic kidney disease: Secondary | ICD-10-CM | POA: Diagnosis not present

## 2021-04-15 DIAGNOSIS — N1831 Chronic kidney disease, stage 3a: Secondary | ICD-10-CM | POA: Diagnosis not present

## 2021-04-15 DIAGNOSIS — E876 Hypokalemia: Secondary | ICD-10-CM | POA: Diagnosis not present

## 2021-04-15 MED ORDER — FARXIGA 5 MG PO TABS
ORAL_TABLET | ORAL | 11 refills | Status: DC
  Filled 2021-04-15: qty 30, 30d supply, fill #0
  Filled 2021-05-14: qty 30, 30d supply, fill #1
  Filled 2021-06-20: qty 30, 30d supply, fill #2

## 2021-04-15 MED FILL — Continuous Glucose System Sensor: 28 days supply | Qty: 2 | Fill #1 | Status: AC

## 2021-04-15 MED FILL — Continuous Glucose System Sensor: 28 days supply | Qty: 2 | Fill #1 | Status: CN

## 2021-04-16 ENCOUNTER — Other Ambulatory Visit: Payer: Self-pay

## 2021-05-01 ENCOUNTER — Other Ambulatory Visit: Payer: Self-pay

## 2021-05-01 MED ORDER — OLMESARTAN MEDOXOMIL 40 MG PO TABS
ORAL_TABLET | ORAL | 1 refills | Status: AC
  Filled 2021-05-01: qty 90, 90d supply, fill #0

## 2021-05-01 MED FILL — Atorvastatin Calcium Tab 10 MG (Base Equivalent): ORAL | 90 days supply | Qty: 90 | Fill #0 | Status: AC

## 2021-05-02 ENCOUNTER — Other Ambulatory Visit: Payer: Self-pay

## 2021-05-02 DIAGNOSIS — E1169 Type 2 diabetes mellitus with other specified complication: Secondary | ICD-10-CM | POA: Diagnosis not present

## 2021-05-02 DIAGNOSIS — E1159 Type 2 diabetes mellitus with other circulatory complications: Secondary | ICD-10-CM | POA: Diagnosis not present

## 2021-05-02 DIAGNOSIS — E1140 Type 2 diabetes mellitus with diabetic neuropathy, unspecified: Secondary | ICD-10-CM | POA: Diagnosis not present

## 2021-05-02 DIAGNOSIS — I152 Hypertension secondary to endocrine disorders: Secondary | ICD-10-CM | POA: Diagnosis not present

## 2021-05-02 MED ORDER — OLMESARTAN MEDOXOMIL 40 MG PO TABS
40.0000 mg | ORAL_TABLET | Freq: Every day | ORAL | 1 refills | Status: AC
  Filled 2021-05-02 – 2021-05-14 (×2): qty 90, 90d supply, fill #0
  Filled 2021-07-26 – 2021-08-22 (×2): qty 90, 90d supply, fill #1

## 2021-05-14 ENCOUNTER — Other Ambulatory Visit: Payer: Self-pay

## 2021-05-14 MED FILL — Continuous Glucose System Sensor: 28 days supply | Qty: 2 | Fill #2 | Status: AC

## 2021-05-14 MED FILL — Testosterone TD Gel 50 MG/5GM (1%): TRANSDERMAL | 30 days supply | Qty: 150 | Fill #0 | Status: CN

## 2021-05-14 MED FILL — Hydralazine HCl Tab 25 MG: ORAL | 90 days supply | Qty: 180 | Fill #0 | Status: AC

## 2021-05-14 MED FILL — Dulaglutide Soln Auto-injector 1.5 MG/0.5ML: SUBCUTANEOUS | 28 days supply | Qty: 2 | Fill #1 | Status: AC

## 2021-05-24 DIAGNOSIS — G4733 Obstructive sleep apnea (adult) (pediatric): Secondary | ICD-10-CM | POA: Diagnosis not present

## 2021-06-07 ENCOUNTER — Other Ambulatory Visit (HOSPITAL_COMMUNITY): Payer: Self-pay

## 2021-06-12 ENCOUNTER — Other Ambulatory Visit: Payer: Self-pay

## 2021-06-12 MED FILL — Insulin Pen Needle 32 G X 4 MM (1/6" or 5/32"): 90 days supply | Qty: 100 | Fill #0 | Status: AC

## 2021-06-12 MED FILL — Testosterone TD Gel 50 MG/5GM (1%): TRANSDERMAL | 30 days supply | Qty: 150 | Fill #0 | Status: CN

## 2021-06-12 MED FILL — Dulaglutide Soln Auto-injector 1.5 MG/0.5ML: SUBCUTANEOUS | 28 days supply | Qty: 2 | Fill #2 | Status: AC

## 2021-06-19 ENCOUNTER — Other Ambulatory Visit: Payer: Self-pay

## 2021-06-19 MED FILL — Continuous Glucose System Sensor: 28 days supply | Qty: 2 | Fill #3 | Status: AC

## 2021-06-19 MED FILL — Testosterone TD Gel 50 MG/5GM (1%): TRANSDERMAL | 30 days supply | Qty: 150 | Fill #0 | Status: CN

## 2021-06-20 ENCOUNTER — Other Ambulatory Visit: Payer: Self-pay

## 2021-06-23 DIAGNOSIS — G4733 Obstructive sleep apnea (adult) (pediatric): Secondary | ICD-10-CM | POA: Diagnosis not present

## 2021-06-27 DIAGNOSIS — R5381 Other malaise: Secondary | ICD-10-CM | POA: Diagnosis not present

## 2021-06-27 DIAGNOSIS — M159 Polyosteoarthritis, unspecified: Secondary | ICD-10-CM | POA: Diagnosis not present

## 2021-06-27 DIAGNOSIS — R7989 Other specified abnormal findings of blood chemistry: Secondary | ICD-10-CM | POA: Diagnosis not present

## 2021-06-27 DIAGNOSIS — I10 Essential (primary) hypertension: Secondary | ICD-10-CM | POA: Diagnosis not present

## 2021-06-27 DIAGNOSIS — N1831 Chronic kidney disease, stage 3a: Secondary | ICD-10-CM | POA: Diagnosis not present

## 2021-06-27 DIAGNOSIS — E1169 Type 2 diabetes mellitus with other specified complication: Secondary | ICD-10-CM | POA: Diagnosis not present

## 2021-06-27 DIAGNOSIS — M722 Plantar fascial fibromatosis: Secondary | ICD-10-CM | POA: Diagnosis not present

## 2021-06-27 DIAGNOSIS — R5383 Other fatigue: Secondary | ICD-10-CM | POA: Diagnosis not present

## 2021-06-27 DIAGNOSIS — E1159 Type 2 diabetes mellitus with other circulatory complications: Secondary | ICD-10-CM | POA: Diagnosis not present

## 2021-06-27 DIAGNOSIS — M545 Low back pain, unspecified: Secondary | ICD-10-CM | POA: Diagnosis not present

## 2021-06-27 DIAGNOSIS — E1122 Type 2 diabetes mellitus with diabetic chronic kidney disease: Secondary | ICD-10-CM | POA: Diagnosis not present

## 2021-07-10 ENCOUNTER — Other Ambulatory Visit (HOSPITAL_COMMUNITY): Payer: Self-pay

## 2021-07-10 ENCOUNTER — Other Ambulatory Visit: Payer: Self-pay

## 2021-07-10 MED ORDER — TESTOSTERONE 50 MG/5GM (1%) TD GEL
10.0000 g | Freq: Every day | TRANSDERMAL | 5 refills | Status: DC
  Filled 2021-07-10 – 2021-07-23 (×3): qty 150, 15d supply, fill #0

## 2021-07-10 MED FILL — Dulaglutide Soln Auto-injector 1.5 MG/0.5ML: SUBCUTANEOUS | 28 days supply | Qty: 2 | Fill #3 | Status: AC

## 2021-07-15 ENCOUNTER — Other Ambulatory Visit: Payer: Self-pay

## 2021-07-15 MED ORDER — TESTOSTERONE 50 MG/5GM (1%) TD GEL
TRANSDERMAL | 5 refills | Status: AC
  Filled 2021-07-15: qty 150, 15d supply, fill #0
  Filled 2021-07-23: qty 150, 30d supply, fill #0

## 2021-07-15 MED FILL — Testosterone TD Gel 50 MG/5GM (1%): TRANSDERMAL | 30 days supply | Qty: 150 | Fill #0 | Status: CN

## 2021-07-17 ENCOUNTER — Other Ambulatory Visit: Payer: Self-pay

## 2021-07-17 DIAGNOSIS — E876 Hypokalemia: Secondary | ICD-10-CM | POA: Diagnosis not present

## 2021-07-17 DIAGNOSIS — I129 Hypertensive chronic kidney disease with stage 1 through stage 4 chronic kidney disease, or unspecified chronic kidney disease: Secondary | ICD-10-CM | POA: Diagnosis not present

## 2021-07-17 DIAGNOSIS — E1122 Type 2 diabetes mellitus with diabetic chronic kidney disease: Secondary | ICD-10-CM | POA: Diagnosis not present

## 2021-07-17 DIAGNOSIS — N1831 Chronic kidney disease, stage 3a: Secondary | ICD-10-CM | POA: Diagnosis not present

## 2021-07-17 MED ORDER — FARXIGA 10 MG PO TABS
ORAL_TABLET | ORAL | 11 refills | Status: AC
  Filled 2021-07-17: qty 30, 30d supply, fill #0
  Filled 2021-08-22: qty 30, 30d supply, fill #1
  Filled 2021-09-24: qty 30, 30d supply, fill #2
  Filled 2021-10-21: qty 30, 30d supply, fill #3
  Filled 2021-11-26: qty 30, 30d supply, fill #4
  Filled 2021-12-24: qty 30, 30d supply, fill #5
  Filled 2022-02-03: qty 30, 30d supply, fill #6

## 2021-07-22 ENCOUNTER — Other Ambulatory Visit: Payer: Self-pay

## 2021-07-22 MED ORDER — TESTOSTERONE 50 MG/5GM (1%) TD GEL
TRANSDERMAL | 5 refills | Status: DC
  Filled 2021-07-22: qty 150, 30d supply, fill #0
  Filled 2021-07-23 (×2): qty 150, 15d supply, fill #0
  Filled 2021-07-26: qty 150, 30d supply, fill #0
  Filled 2021-08-22: qty 150, 15d supply, fill #0
  Filled 2021-10-02: qty 150, 15d supply, fill #1
  Filled 2021-11-19: qty 150, 15d supply, fill #2

## 2021-07-22 MED FILL — Testosterone TD Gel 50 MG/5GM (1%): TRANSDERMAL | 30 days supply | Qty: 150 | Fill #0 | Status: CN

## 2021-07-23 ENCOUNTER — Other Ambulatory Visit: Payer: Self-pay

## 2021-07-23 MED FILL — Testosterone TD Gel 50 MG/5GM (1%): TRANSDERMAL | 30 days supply | Qty: 150 | Fill #0 | Status: CN

## 2021-07-24 ENCOUNTER — Other Ambulatory Visit: Payer: Self-pay

## 2021-07-24 DIAGNOSIS — G4733 Obstructive sleep apnea (adult) (pediatric): Secondary | ICD-10-CM | POA: Diagnosis not present

## 2021-07-24 MED FILL — Testosterone TD Gel 50 MG/5GM (1%): TRANSDERMAL | 30 days supply | Qty: 150 | Fill #0 | Status: CN

## 2021-07-26 ENCOUNTER — Other Ambulatory Visit (HOSPITAL_COMMUNITY): Payer: Self-pay

## 2021-07-26 ENCOUNTER — Other Ambulatory Visit: Payer: Self-pay

## 2021-07-26 MED ORDER — ATORVASTATIN CALCIUM 10 MG PO TABS
ORAL_TABLET | ORAL | 1 refills | Status: AC
  Filled 2021-07-26: qty 90, 90d supply, fill #0
  Filled 2021-08-22: qty 90, 90d supply, fill #1

## 2021-07-26 MED FILL — Testosterone TD Gel 50 MG/5GM (1%): TRANSDERMAL | 30 days supply | Qty: 150 | Fill #0 | Status: CN

## 2021-07-29 ENCOUNTER — Other Ambulatory Visit: Payer: Self-pay

## 2021-08-01 ENCOUNTER — Other Ambulatory Visit: Payer: Self-pay

## 2021-08-01 MED FILL — Testosterone TD Gel 50 MG/5GM (1%): TRANSDERMAL | 30 days supply | Qty: 150 | Fill #0 | Status: CN

## 2021-08-02 ENCOUNTER — Other Ambulatory Visit: Payer: Self-pay

## 2021-08-02 MED FILL — Dulaglutide Soln Auto-injector 1.5 MG/0.5ML: SUBCUTANEOUS | 28 days supply | Qty: 2 | Fill #4 | Status: AC

## 2021-08-08 ENCOUNTER — Other Ambulatory Visit: Payer: Self-pay

## 2021-08-08 ENCOUNTER — Other Ambulatory Visit (HOSPITAL_COMMUNITY): Payer: Self-pay

## 2021-08-08 MED ORDER — FREESTYLE LIBRE 14 DAY SENSOR MISC
11 refills | Status: AC
  Filled 2021-08-08: qty 2, 28d supply, fill #0
  Filled 2021-10-09: qty 2, 28d supply, fill #1

## 2021-08-09 ENCOUNTER — Other Ambulatory Visit (HOSPITAL_COMMUNITY): Payer: Self-pay

## 2021-08-09 MED ORDER — FREESTYLE LIBRE 14 DAY SENSOR MISC
11 refills | Status: AC
  Filled 2021-10-18: qty 2, 28d supply, fill #0

## 2021-08-15 DIAGNOSIS — E349 Endocrine disorder, unspecified: Secondary | ICD-10-CM | POA: Diagnosis not present

## 2021-08-22 ENCOUNTER — Other Ambulatory Visit: Payer: Self-pay

## 2021-08-23 ENCOUNTER — Other Ambulatory Visit: Payer: Self-pay

## 2021-08-23 MED ORDER — HYDRALAZINE HCL 25 MG PO TABS
25.0000 mg | ORAL_TABLET | Freq: Two times a day (BID) | ORAL | 1 refills | Status: AC
  Filled 2021-08-23: qty 180, 90d supply, fill #0
  Filled 2021-11-19: qty 180, 90d supply, fill #1

## 2021-08-24 DIAGNOSIS — G4733 Obstructive sleep apnea (adult) (pediatric): Secondary | ICD-10-CM | POA: Diagnosis not present

## 2021-08-29 ENCOUNTER — Other Ambulatory Visit (HOSPITAL_COMMUNITY): Payer: Self-pay

## 2021-09-02 ENCOUNTER — Other Ambulatory Visit: Payer: Self-pay

## 2021-09-02 MED FILL — Dulaglutide Soln Auto-injector 1.5 MG/0.5ML: SUBCUTANEOUS | 28 days supply | Qty: 2 | Fill #5 | Status: AC

## 2021-09-04 ENCOUNTER — Other Ambulatory Visit (HOSPITAL_COMMUNITY): Payer: Self-pay

## 2021-09-24 ENCOUNTER — Other Ambulatory Visit: Payer: Self-pay

## 2021-09-24 MED FILL — Insulin Pen Needle 32 G X 4 MM (1/6" or 5/32"): 90 days supply | Qty: 100 | Fill #1 | Status: AC

## 2021-10-02 ENCOUNTER — Other Ambulatory Visit: Payer: Self-pay

## 2021-10-02 MED FILL — Dulaglutide Soln Auto-injector 1.5 MG/0.5ML: SUBCUTANEOUS | 28 days supply | Qty: 2 | Fill #6 | Status: AC

## 2021-10-09 ENCOUNTER — Other Ambulatory Visit: Payer: Self-pay

## 2021-10-18 ENCOUNTER — Other Ambulatory Visit: Payer: Self-pay

## 2021-10-18 MED ORDER — FREESTYLE LIBRE 14 DAY SENSOR MISC
11 refills | Status: AC
  Filled 2021-10-29: qty 2, 28d supply, fill #0

## 2021-10-18 MED ORDER — AMOXICILLIN 875 MG PO TABS
ORAL_TABLET | ORAL | 0 refills | Status: AC
  Filled 2021-10-18: qty 20, 10d supply, fill #0

## 2021-10-21 ENCOUNTER — Other Ambulatory Visit: Payer: Self-pay

## 2021-10-23 ENCOUNTER — Other Ambulatory Visit: Payer: Self-pay

## 2021-10-23 DIAGNOSIS — E1169 Type 2 diabetes mellitus with other specified complication: Secondary | ICD-10-CM | POA: Diagnosis not present

## 2021-10-23 DIAGNOSIS — I152 Hypertension secondary to endocrine disorders: Secondary | ICD-10-CM | POA: Diagnosis not present

## 2021-10-23 DIAGNOSIS — E1159 Type 2 diabetes mellitus with other circulatory complications: Secondary | ICD-10-CM | POA: Diagnosis not present

## 2021-10-23 DIAGNOSIS — E1140 Type 2 diabetes mellitus with diabetic neuropathy, unspecified: Secondary | ICD-10-CM | POA: Diagnosis not present

## 2021-10-23 MED ORDER — FREESTYLE LIBRE 2 SENSOR MISC: 12 refills | Status: AC

## 2021-10-28 ENCOUNTER — Other Ambulatory Visit: Payer: Self-pay

## 2021-10-28 MED ORDER — DEXCOM G6 SENSOR MISC
11 refills | Status: AC
  Filled 2021-10-28: qty 3, 30d supply, fill #0
  Filled 2021-10-29: qty 3, 30d supply, fill #1
  Filled 2021-10-29 – 2021-10-30 (×2): qty 3, 30d supply, fill #0
  Filled 2021-11-19: qty 3, 30d supply, fill #1
  Filled 2021-12-24: qty 3, 30d supply, fill #2
  Filled 2022-02-03: qty 3, 30d supply, fill #3
  Filled 2022-02-21: qty 3, 30d supply, fill #4
  Filled 2022-04-01: qty 3, 30d supply, fill #5
  Filled 2022-05-07: qty 3, 30d supply, fill #6
  Filled 2022-06-17: qty 3, 30d supply, fill #7
  Filled 2022-07-22: qty 3, 30d supply, fill #8
  Filled 2022-08-27: qty 3, 30d supply, fill #9

## 2021-10-28 MED ORDER — DEXCOM G6 TRANSMITTER MISC
3 refills | Status: AC
  Filled 2021-10-28: qty 1, 90d supply, fill #0
  Filled 2021-10-29: qty 1, 30d supply, fill #0
  Filled 2021-10-30: qty 1, 90d supply, fill #0

## 2021-10-29 ENCOUNTER — Other Ambulatory Visit (HOSPITAL_BASED_OUTPATIENT_CLINIC_OR_DEPARTMENT_OTHER): Payer: Self-pay

## 2021-10-29 ENCOUNTER — Other Ambulatory Visit (HOSPITAL_COMMUNITY): Payer: Self-pay

## 2021-10-29 ENCOUNTER — Other Ambulatory Visit: Payer: Self-pay

## 2021-10-30 ENCOUNTER — Other Ambulatory Visit: Payer: Self-pay

## 2021-10-30 DIAGNOSIS — N1831 Chronic kidney disease, stage 3a: Secondary | ICD-10-CM | POA: Diagnosis not present

## 2021-10-30 DIAGNOSIS — E876 Hypokalemia: Secondary | ICD-10-CM | POA: Diagnosis not present

## 2021-10-30 DIAGNOSIS — I129 Hypertensive chronic kidney disease with stage 1 through stage 4 chronic kidney disease, or unspecified chronic kidney disease: Secondary | ICD-10-CM | POA: Diagnosis not present

## 2021-10-30 MED ORDER — KERENDIA 10 MG PO TABS
ORAL_TABLET | ORAL | 11 refills | Status: DC
  Filled 2021-10-30: qty 30, 30d supply, fill #0
  Filled 2021-12-09 – 2021-12-12 (×4): qty 30, 30d supply, fill #1

## 2021-10-31 ENCOUNTER — Other Ambulatory Visit: Payer: Self-pay

## 2021-10-31 MED FILL — Dulaglutide Soln Auto-injector 1.5 MG/0.5ML: SUBCUTANEOUS | 28 days supply | Qty: 2 | Fill #7 | Status: AC

## 2021-11-06 ENCOUNTER — Other Ambulatory Visit: Payer: Self-pay

## 2021-11-06 MED ORDER — GLIMEPIRIDE 2 MG PO TABS
ORAL_TABLET | ORAL | 11 refills | Status: AC
  Filled 2021-11-06: qty 30, 30d supply, fill #0

## 2021-11-06 MED ORDER — POTASSIUM CITRATE ER 10 MEQ (1080 MG) PO TBCR
EXTENDED_RELEASE_TABLET | ORAL | 1 refills | Status: AC
  Filled 2021-11-06: qty 270, 90d supply, fill #0
  Filled 2022-04-15: qty 270, 90d supply, fill #1

## 2021-11-07 ENCOUNTER — Other Ambulatory Visit: Payer: Self-pay

## 2021-11-07 MED ORDER — ATORVASTATIN CALCIUM 10 MG PO TABS
10.0000 mg | ORAL_TABLET | Freq: Every day | ORAL | 1 refills | Status: AC
  Filled 2021-11-07: qty 90, 90d supply, fill #0
  Filled 2022-02-03: qty 90, 90d supply, fill #1

## 2021-11-19 ENCOUNTER — Other Ambulatory Visit: Payer: Self-pay

## 2021-11-20 ENCOUNTER — Other Ambulatory Visit: Payer: Self-pay

## 2021-11-25 ENCOUNTER — Other Ambulatory Visit: Payer: Self-pay

## 2021-11-25 MED FILL — Dulaglutide Soln Auto-injector 1.5 MG/0.5ML: SUBCUTANEOUS | 28 days supply | Qty: 2 | Fill #8 | Status: AC

## 2021-11-26 ENCOUNTER — Other Ambulatory Visit: Payer: Self-pay

## 2021-11-26 MED ORDER — GLIMEPIRIDE 4 MG PO TABS
ORAL_TABLET | ORAL | 3 refills | Status: AC
  Filled 2021-11-26: qty 90, 90d supply, fill #0
  Filled 2022-03-18: qty 90, 90d supply, fill #1
  Filled 2022-04-29 – 2022-06-12 (×2): qty 90, 90d supply, fill #2
  Filled 2022-10-06: qty 90, 90d supply, fill #3

## 2021-11-26 MED ORDER — OLMESARTAN MEDOXOMIL 40 MG PO TABS
40.0000 mg | ORAL_TABLET | Freq: Every day | ORAL | 1 refills | Status: DC
  Filled 2021-11-26: qty 90, 90d supply, fill #0
  Filled 2022-02-21: qty 90, 90d supply, fill #1

## 2021-11-27 ENCOUNTER — Other Ambulatory Visit: Payer: Self-pay

## 2021-11-28 ENCOUNTER — Other Ambulatory Visit: Payer: Self-pay

## 2021-12-03 DIAGNOSIS — I129 Hypertensive chronic kidney disease with stage 1 through stage 4 chronic kidney disease, or unspecified chronic kidney disease: Secondary | ICD-10-CM | POA: Diagnosis not present

## 2021-12-03 DIAGNOSIS — E876 Hypokalemia: Secondary | ICD-10-CM | POA: Diagnosis not present

## 2021-12-03 DIAGNOSIS — E1122 Type 2 diabetes mellitus with diabetic chronic kidney disease: Secondary | ICD-10-CM | POA: Diagnosis not present

## 2021-12-03 DIAGNOSIS — N1831 Chronic kidney disease, stage 3a: Secondary | ICD-10-CM | POA: Diagnosis not present

## 2021-12-09 ENCOUNTER — Other Ambulatory Visit: Payer: Self-pay

## 2021-12-10 ENCOUNTER — Other Ambulatory Visit: Payer: Self-pay

## 2021-12-10 DIAGNOSIS — I10 Essential (primary) hypertension: Secondary | ICD-10-CM | POA: Diagnosis not present

## 2021-12-10 DIAGNOSIS — J069 Acute upper respiratory infection, unspecified: Secondary | ICD-10-CM | POA: Diagnosis not present

## 2021-12-10 DIAGNOSIS — E1159 Type 2 diabetes mellitus with other circulatory complications: Secondary | ICD-10-CM | POA: Diagnosis not present

## 2021-12-10 DIAGNOSIS — E1140 Type 2 diabetes mellitus with diabetic neuropathy, unspecified: Secondary | ICD-10-CM | POA: Diagnosis not present

## 2021-12-10 MED ORDER — AZITHROMYCIN 250 MG PO TABS
ORAL_TABLET | ORAL | 0 refills | Status: AC
  Filled 2021-12-10: qty 6, 5d supply, fill #0

## 2021-12-10 MED ORDER — HYDROCODONE BIT-HOMATROP MBR 5-1.5 MG/5ML PO SOLN
ORAL | 0 refills | Status: AC
  Filled 2021-12-10: qty 70, 10d supply, fill #0

## 2021-12-11 ENCOUNTER — Other Ambulatory Visit: Payer: Self-pay

## 2021-12-12 ENCOUNTER — Other Ambulatory Visit: Payer: Self-pay

## 2021-12-13 ENCOUNTER — Other Ambulatory Visit: Payer: Self-pay

## 2021-12-13 MED ORDER — SPIRONOLACTONE 25 MG PO TABS
ORAL_TABLET | ORAL | 11 refills | Status: DC
  Filled 2021-12-13: qty 30, 30d supply, fill #0
  Filled 2022-01-13: qty 30, 30d supply, fill #1
  Filled 2022-02-10: qty 30, 30d supply, fill #2
  Filled 2022-03-18: qty 30, 30d supply, fill #3
  Filled 2022-04-15: qty 30, 30d supply, fill #4

## 2021-12-24 ENCOUNTER — Other Ambulatory Visit: Payer: Self-pay

## 2021-12-24 MED FILL — Dulaglutide Soln Auto-injector 1.5 MG/0.5ML: SUBCUTANEOUS | 28 days supply | Qty: 2 | Fill #9 | Status: AC

## 2021-12-27 ENCOUNTER — Other Ambulatory Visit: Payer: Self-pay

## 2021-12-27 DIAGNOSIS — M2550 Pain in unspecified joint: Secondary | ICD-10-CM | POA: Diagnosis not present

## 2021-12-27 DIAGNOSIS — M159 Polyosteoarthritis, unspecified: Secondary | ICD-10-CM | POA: Diagnosis not present

## 2021-12-27 DIAGNOSIS — Z0000 Encounter for general adult medical examination without abnormal findings: Secondary | ICD-10-CM | POA: Diagnosis not present

## 2021-12-27 DIAGNOSIS — I10 Essential (primary) hypertension: Secondary | ICD-10-CM | POA: Diagnosis not present

## 2021-12-27 DIAGNOSIS — E1165 Type 2 diabetes mellitus with hyperglycemia: Secondary | ICD-10-CM | POA: Diagnosis not present

## 2021-12-27 MED ORDER — SILDENAFIL CITRATE 100 MG PO TABS
ORAL_TABLET | ORAL | 2 refills | Status: AC
  Filled 2021-12-27: qty 6, 30d supply, fill #0

## 2022-01-13 ENCOUNTER — Other Ambulatory Visit: Payer: Self-pay

## 2022-01-13 MED ORDER — BD PEN NEEDLE NANO 2ND GEN 32G X 4 MM MISC
3 refills | Status: AC
  Filled 2022-01-13: qty 100, 90d supply, fill #0

## 2022-01-13 MED ORDER — UNIFINE PENTIPS 32G X 4 MM MISC
Freq: Every day | 3 refills | Status: AC
  Filled 2022-01-14: qty 100, 90d supply, fill #0
  Filled 2022-04-29: qty 100, 90d supply, fill #1

## 2022-01-14 ENCOUNTER — Other Ambulatory Visit: Payer: Self-pay

## 2022-01-21 ENCOUNTER — Other Ambulatory Visit: Payer: Self-pay

## 2022-01-21 MED ORDER — TESTOSTERONE 50 MG/5GM (1%) TD GEL
TRANSDERMAL | 5 refills | Status: DC
  Filled 2022-01-21: qty 150, 15d supply, fill #0
  Filled 2022-04-01: qty 150, 15d supply, fill #1
  Filled 2022-05-23: qty 150, 15d supply, fill #2
  Filled 2022-07-17: qty 150, 15d supply, fill #3

## 2022-01-21 MED FILL — Dulaglutide Soln Auto-injector 1.5 MG/0.5ML: SUBCUTANEOUS | 28 days supply | Qty: 2 | Fill #10 | Status: AC

## 2022-01-29 DIAGNOSIS — E1159 Type 2 diabetes mellitus with other circulatory complications: Secondary | ICD-10-CM | POA: Diagnosis not present

## 2022-01-29 DIAGNOSIS — E1140 Type 2 diabetes mellitus with diabetic neuropathy, unspecified: Secondary | ICD-10-CM | POA: Diagnosis not present

## 2022-01-29 DIAGNOSIS — E1169 Type 2 diabetes mellitus with other specified complication: Secondary | ICD-10-CM | POA: Diagnosis not present

## 2022-01-29 DIAGNOSIS — I152 Hypertension secondary to endocrine disorders: Secondary | ICD-10-CM | POA: Diagnosis not present

## 2022-02-03 ENCOUNTER — Other Ambulatory Visit: Payer: Self-pay

## 2022-02-04 ENCOUNTER — Other Ambulatory Visit: Payer: Self-pay

## 2022-02-10 ENCOUNTER — Other Ambulatory Visit: Payer: Self-pay

## 2022-02-10 DIAGNOSIS — E876 Hypokalemia: Secondary | ICD-10-CM | POA: Diagnosis not present

## 2022-02-10 DIAGNOSIS — I10 Essential (primary) hypertension: Secondary | ICD-10-CM | POA: Diagnosis not present

## 2022-02-10 DIAGNOSIS — E1122 Type 2 diabetes mellitus with diabetic chronic kidney disease: Secondary | ICD-10-CM | POA: Diagnosis not present

## 2022-02-10 DIAGNOSIS — N1831 Chronic kidney disease, stage 3a: Secondary | ICD-10-CM | POA: Diagnosis not present

## 2022-02-20 ENCOUNTER — Other Ambulatory Visit: Payer: Self-pay

## 2022-02-20 MED ORDER — INSULIN DEGLUDEC FLEXTOUCH 200 UNIT/ML ~~LOC~~ SOPN
PEN_INJECTOR | SUBCUTANEOUS | 11 refills | Status: DC
  Filled 2022-02-20: qty 9, 27d supply, fill #0
  Filled 2022-03-20: qty 9, 27d supply, fill #1
  Filled 2022-05-01: qty 9, 27d supply, fill #2
  Filled 2022-06-12: qty 9, 27d supply, fill #3

## 2022-02-20 MED FILL — Dulaglutide Soln Auto-injector 1.5 MG/0.5ML: SUBCUTANEOUS | 28 days supply | Qty: 2 | Fill #11 | Status: AC

## 2022-02-21 ENCOUNTER — Other Ambulatory Visit: Payer: Self-pay

## 2022-02-25 ENCOUNTER — Other Ambulatory Visit: Payer: Self-pay

## 2022-02-26 ENCOUNTER — Other Ambulatory Visit: Payer: Self-pay

## 2022-02-27 ENCOUNTER — Other Ambulatory Visit: Payer: Self-pay

## 2022-02-27 MED ORDER — HYDRALAZINE HCL 25 MG PO TABS
ORAL_TABLET | ORAL | 1 refills | Status: AC
  Filled 2022-02-27: qty 180, 90d supply, fill #0

## 2022-02-27 MED ORDER — FARXIGA 10 MG PO TABS
10.0000 mg | ORAL_TABLET | Freq: Every day | ORAL | 3 refills | Status: AC
  Filled 2022-02-27: qty 90, 90d supply, fill #0
  Filled 2022-04-29 – 2022-05-30 (×2): qty 90, 90d supply, fill #1
  Filled 2022-12-02: qty 90, 90d supply, fill #2

## 2022-03-18 ENCOUNTER — Other Ambulatory Visit: Payer: Self-pay

## 2022-03-20 ENCOUNTER — Other Ambulatory Visit: Payer: Self-pay

## 2022-03-21 ENCOUNTER — Other Ambulatory Visit: Payer: Self-pay

## 2022-03-21 MED ORDER — TRULICITY 1.5 MG/0.5ML ~~LOC~~ SOAJ
SUBCUTANEOUS | 12 refills | Status: DC
  Filled 2022-03-21: qty 2, 28d supply, fill #0
  Filled 2022-04-15: qty 2, 28d supply, fill #1
  Filled 2022-05-26: qty 2, 28d supply, fill #2
  Filled 2022-06-12: qty 2, 28d supply, fill #3

## 2022-04-01 ENCOUNTER — Other Ambulatory Visit: Payer: Self-pay

## 2022-04-14 ENCOUNTER — Other Ambulatory Visit: Payer: Self-pay

## 2022-04-14 DIAGNOSIS — Z794 Long term (current) use of insulin: Secondary | ICD-10-CM | POA: Diagnosis not present

## 2022-04-14 DIAGNOSIS — K625 Hemorrhage of anus and rectum: Secondary | ICD-10-CM | POA: Diagnosis not present

## 2022-04-14 DIAGNOSIS — I10 Essential (primary) hypertension: Secondary | ICD-10-CM | POA: Diagnosis not present

## 2022-04-14 DIAGNOSIS — E1165 Type 2 diabetes mellitus with hyperglycemia: Secondary | ICD-10-CM | POA: Diagnosis not present

## 2022-04-14 MED ORDER — HYDROCORTISONE 1 % EX CREA
TOPICAL_CREAM | CUTANEOUS | 0 refills | Status: AC
  Filled 2022-04-14: qty 28, 10d supply, fill #0

## 2022-04-15 ENCOUNTER — Other Ambulatory Visit: Payer: Self-pay

## 2022-04-16 ENCOUNTER — Other Ambulatory Visit: Payer: Self-pay

## 2022-04-18 DIAGNOSIS — E1122 Type 2 diabetes mellitus with diabetic chronic kidney disease: Secondary | ICD-10-CM | POA: Diagnosis not present

## 2022-04-18 DIAGNOSIS — R635 Abnormal weight gain: Secondary | ICD-10-CM | POA: Diagnosis not present

## 2022-04-18 DIAGNOSIS — Z6832 Body mass index (BMI) 32.0-32.9, adult: Secondary | ICD-10-CM | POA: Diagnosis not present

## 2022-04-18 DIAGNOSIS — I10 Essential (primary) hypertension: Secondary | ICD-10-CM | POA: Diagnosis not present

## 2022-04-18 DIAGNOSIS — Z125 Encounter for screening for malignant neoplasm of prostate: Secondary | ICD-10-CM | POA: Diagnosis not present

## 2022-04-18 DIAGNOSIS — E291 Testicular hypofunction: Secondary | ICD-10-CM | POA: Diagnosis not present

## 2022-04-22 DIAGNOSIS — N1831 Chronic kidney disease, stage 3a: Secondary | ICD-10-CM | POA: Diagnosis not present

## 2022-04-22 DIAGNOSIS — E876 Hypokalemia: Secondary | ICD-10-CM | POA: Diagnosis not present

## 2022-04-22 DIAGNOSIS — I129 Hypertensive chronic kidney disease with stage 1 through stage 4 chronic kidney disease, or unspecified chronic kidney disease: Secondary | ICD-10-CM | POA: Diagnosis not present

## 2022-04-22 DIAGNOSIS — N183 Chronic kidney disease, stage 3 unspecified: Secondary | ICD-10-CM | POA: Diagnosis not present

## 2022-04-22 DIAGNOSIS — E1122 Type 2 diabetes mellitus with diabetic chronic kidney disease: Secondary | ICD-10-CM | POA: Diagnosis not present

## 2022-04-22 DIAGNOSIS — I10 Essential (primary) hypertension: Secondary | ICD-10-CM | POA: Diagnosis not present

## 2022-04-22 DIAGNOSIS — I151 Hypertension secondary to other renal disorders: Secondary | ICD-10-CM | POA: Diagnosis not present

## 2022-04-23 DIAGNOSIS — E1122 Type 2 diabetes mellitus with diabetic chronic kidney disease: Secondary | ICD-10-CM | POA: Diagnosis not present

## 2022-04-23 DIAGNOSIS — I10 Essential (primary) hypertension: Secondary | ICD-10-CM | POA: Diagnosis not present

## 2022-04-23 DIAGNOSIS — G629 Polyneuropathy, unspecified: Secondary | ICD-10-CM | POA: Diagnosis not present

## 2022-04-23 DIAGNOSIS — Z1339 Encounter for screening examination for other mental health and behavioral disorders: Secondary | ICD-10-CM | POA: Diagnosis not present

## 2022-04-23 DIAGNOSIS — M1990 Unspecified osteoarthritis, unspecified site: Secondary | ICD-10-CM | POA: Diagnosis not present

## 2022-04-23 DIAGNOSIS — Z6833 Body mass index (BMI) 33.0-33.9, adult: Secondary | ICD-10-CM | POA: Diagnosis not present

## 2022-04-23 DIAGNOSIS — R635 Abnormal weight gain: Secondary | ICD-10-CM | POA: Diagnosis not present

## 2022-04-23 DIAGNOSIS — Z1331 Encounter for screening for depression: Secondary | ICD-10-CM | POA: Diagnosis not present

## 2022-04-29 ENCOUNTER — Other Ambulatory Visit: Payer: Self-pay

## 2022-05-01 ENCOUNTER — Other Ambulatory Visit: Payer: Self-pay

## 2022-05-05 ENCOUNTER — Other Ambulatory Visit: Payer: Self-pay

## 2022-05-05 DIAGNOSIS — M159 Polyosteoarthritis, unspecified: Secondary | ICD-10-CM | POA: Diagnosis not present

## 2022-05-05 DIAGNOSIS — E1165 Type 2 diabetes mellitus with hyperglycemia: Secondary | ICD-10-CM | POA: Diagnosis not present

## 2022-05-05 DIAGNOSIS — M545 Low back pain, unspecified: Secondary | ICD-10-CM | POA: Diagnosis not present

## 2022-05-05 DIAGNOSIS — Z794 Long term (current) use of insulin: Secondary | ICD-10-CM | POA: Diagnosis not present

## 2022-05-05 DIAGNOSIS — Z0000 Encounter for general adult medical examination without abnormal findings: Secondary | ICD-10-CM | POA: Diagnosis not present

## 2022-05-05 DIAGNOSIS — Z125 Encounter for screening for malignant neoplasm of prostate: Secondary | ICD-10-CM | POA: Diagnosis not present

## 2022-05-05 DIAGNOSIS — N1832 Chronic kidney disease, stage 3b: Secondary | ICD-10-CM | POA: Diagnosis not present

## 2022-05-05 DIAGNOSIS — F330 Major depressive disorder, recurrent, mild: Secondary | ICD-10-CM | POA: Diagnosis not present

## 2022-05-05 DIAGNOSIS — Z6832 Body mass index (BMI) 32.0-32.9, adult: Secondary | ICD-10-CM | POA: Diagnosis not present

## 2022-05-05 DIAGNOSIS — I10 Essential (primary) hypertension: Secondary | ICD-10-CM | POA: Diagnosis not present

## 2022-05-05 MED ORDER — DEXCOM G7 SENSOR MISC
12 refills | Status: AC
  Filled 2022-05-05 – 2022-12-02 (×6): qty 3, 30d supply, fill #0
  Filled 2023-01-08 (×2): qty 3, 30d supply, fill #1
  Filled 2023-01-30 – 2023-02-05 (×3): qty 3, 30d supply, fill #2

## 2022-05-05 MED ORDER — ATORVASTATIN CALCIUM 10 MG PO TABS
10.0000 mg | ORAL_TABLET | Freq: Every day | ORAL | 1 refills | Status: DC
  Filled 2022-05-05: qty 90, 90d supply, fill #0
  Filled 2022-08-19: qty 90, 90d supply, fill #1

## 2022-05-07 ENCOUNTER — Other Ambulatory Visit: Payer: Self-pay

## 2022-05-08 DIAGNOSIS — Z6832 Body mass index (BMI) 32.0-32.9, adult: Secondary | ICD-10-CM | POA: Diagnosis not present

## 2022-05-08 DIAGNOSIS — N183 Chronic kidney disease, stage 3 unspecified: Secondary | ICD-10-CM | POA: Diagnosis not present

## 2022-05-23 ENCOUNTER — Other Ambulatory Visit: Payer: Self-pay

## 2022-05-26 ENCOUNTER — Other Ambulatory Visit: Payer: Self-pay

## 2022-05-26 DIAGNOSIS — K625 Hemorrhage of anus and rectum: Secondary | ICD-10-CM | POA: Diagnosis not present

## 2022-05-26 DIAGNOSIS — Z800 Family history of malignant neoplasm of digestive organs: Secondary | ICD-10-CM | POA: Diagnosis not present

## 2022-05-26 MED ORDER — PEG 3350-KCL-NA BICARB-NACL 420 G PO SOLR
ORAL | 0 refills | Status: AC
  Filled 2022-05-26: qty 4000, 1d supply, fill #0

## 2022-05-30 ENCOUNTER — Other Ambulatory Visit: Payer: Self-pay

## 2022-05-30 DIAGNOSIS — Z6832 Body mass index (BMI) 32.0-32.9, adult: Secondary | ICD-10-CM | POA: Diagnosis not present

## 2022-05-30 DIAGNOSIS — I10 Essential (primary) hypertension: Secondary | ICD-10-CM | POA: Diagnosis not present

## 2022-05-30 MED ORDER — OLMESARTAN MEDOXOMIL 40 MG PO TABS
40.0000 mg | ORAL_TABLET | Freq: Every day | ORAL | 1 refills | Status: AC
  Filled 2022-05-30: qty 30, 30d supply, fill #0
  Filled 2022-06-26: qty 30, 30d supply, fill #1
  Filled 2022-07-30: qty 30, 30d supply, fill #2
  Filled 2022-09-05: qty 90, 90d supply, fill #3

## 2022-06-12 ENCOUNTER — Other Ambulatory Visit: Payer: Self-pay

## 2022-06-12 DIAGNOSIS — E1159 Type 2 diabetes mellitus with other circulatory complications: Secondary | ICD-10-CM | POA: Diagnosis not present

## 2022-06-12 DIAGNOSIS — I152 Hypertension secondary to endocrine disorders: Secondary | ICD-10-CM | POA: Diagnosis not present

## 2022-06-12 DIAGNOSIS — E785 Hyperlipidemia, unspecified: Secondary | ICD-10-CM | POA: Diagnosis not present

## 2022-06-12 DIAGNOSIS — E1169 Type 2 diabetes mellitus with other specified complication: Secondary | ICD-10-CM | POA: Diagnosis not present

## 2022-06-12 DIAGNOSIS — E1140 Type 2 diabetes mellitus with diabetic neuropathy, unspecified: Secondary | ICD-10-CM | POA: Diagnosis not present

## 2022-06-12 MED ORDER — MOUNJARO 5 MG/0.5ML ~~LOC~~ SOAJ
SUBCUTANEOUS | 6 refills | Status: AC
  Filled 2022-06-12: qty 2, 28d supply, fill #0
  Filled 2022-07-17: qty 2, 28d supply, fill #1
  Filled 2022-08-13: qty 2, 28d supply, fill #2
  Filled 2022-09-15: qty 2, 28d supply, fill #3

## 2022-06-17 ENCOUNTER — Other Ambulatory Visit: Payer: Self-pay

## 2022-06-26 ENCOUNTER — Other Ambulatory Visit: Payer: Self-pay

## 2022-06-26 DIAGNOSIS — E876 Hypokalemia: Secondary | ICD-10-CM | POA: Diagnosis not present

## 2022-06-26 DIAGNOSIS — E1122 Type 2 diabetes mellitus with diabetic chronic kidney disease: Secondary | ICD-10-CM | POA: Diagnosis not present

## 2022-06-26 DIAGNOSIS — I10 Essential (primary) hypertension: Secondary | ICD-10-CM | POA: Diagnosis not present

## 2022-06-26 DIAGNOSIS — N1832 Chronic kidney disease, stage 3b: Secondary | ICD-10-CM | POA: Diagnosis not present

## 2022-07-02 DIAGNOSIS — I10 Essential (primary) hypertension: Secondary | ICD-10-CM | POA: Diagnosis not present

## 2022-07-02 DIAGNOSIS — N1831 Chronic kidney disease, stage 3a: Secondary | ICD-10-CM | POA: Diagnosis not present

## 2022-07-02 DIAGNOSIS — E1122 Type 2 diabetes mellitus with diabetic chronic kidney disease: Secondary | ICD-10-CM | POA: Diagnosis not present

## 2022-07-17 ENCOUNTER — Other Ambulatory Visit: Payer: Self-pay

## 2022-07-22 ENCOUNTER — Other Ambulatory Visit: Payer: Self-pay

## 2022-07-30 ENCOUNTER — Other Ambulatory Visit: Payer: Self-pay

## 2022-08-13 ENCOUNTER — Other Ambulatory Visit: Payer: Self-pay

## 2022-08-19 ENCOUNTER — Other Ambulatory Visit: Payer: Self-pay

## 2022-08-19 MED ORDER — POTASSIUM CITRATE ER 10 MEQ (1080 MG) PO TBCR
EXTENDED_RELEASE_TABLET | ORAL | 1 refills | Status: DC
  Filled 2022-08-19 (×2): qty 270, 90d supply, fill #0
  Filled 2023-01-26: qty 270, 90d supply, fill #1

## 2022-08-20 ENCOUNTER — Other Ambulatory Visit: Payer: Self-pay

## 2022-08-21 ENCOUNTER — Other Ambulatory Visit: Payer: Self-pay

## 2022-08-25 ENCOUNTER — Other Ambulatory Visit: Payer: Self-pay

## 2022-08-25 MED ORDER — PEG 3350-KCL-NA BICARB-NACL 420 G PO SOLR
ORAL | 0 refills | Status: AC
  Filled 2022-08-25: qty 4000, 1d supply, fill #0

## 2022-08-27 ENCOUNTER — Other Ambulatory Visit: Payer: Self-pay

## 2022-08-28 ENCOUNTER — Encounter: Payer: Self-pay | Admitting: *Deleted

## 2022-08-29 ENCOUNTER — Ambulatory Visit
Admission: RE | Admit: 2022-08-29 | Discharge: 2022-08-29 | Disposition: A | Discharge status: Home / Self Care | Payer: 59 | Attending: Gastroenterology | Admitting: Gastroenterology

## 2022-08-29 ENCOUNTER — Encounter
Admission: RE | Disposition: A | Discharge status: Home / Self Care | Payer: Self-pay | Source: Home / Self Care | Attending: Gastroenterology

## 2022-08-29 ENCOUNTER — Encounter: Payer: Self-pay | Admitting: *Deleted

## 2022-08-29 ENCOUNTER — Ambulatory Visit: Payer: 59 | Admitting: Anesthesiology

## 2022-08-29 DIAGNOSIS — E119 Type 2 diabetes mellitus without complications: Secondary | ICD-10-CM | POA: Diagnosis not present

## 2022-08-29 DIAGNOSIS — Z538 Procedure and treatment not carried out for other reasons: Secondary | ICD-10-CM | POA: Diagnosis not present

## 2022-08-29 DIAGNOSIS — Z01818 Encounter for other preprocedural examination: Secondary | ICD-10-CM | POA: Insufficient documentation

## 2022-08-29 HISTORY — PX: COLONOSCOPY WITH PROPOFOL: SHX5780

## 2022-08-29 LAB — GLUCOSE, CAPILLARY: Glucose-Capillary: 116 mg/dL — ABNORMAL HIGH (ref 70–99)

## 2022-08-29 SURGERY — COLONOSCOPY WITH PROPOFOL
Anesthesia: General

## 2022-08-29 MED ORDER — PROPOFOL 10 MG/ML IV BOLUS
INTRAVENOUS | Status: AC
  Filled 2022-08-29: qty 20

## 2022-08-29 MED ORDER — PROPOFOL 1000 MG/100ML IV EMUL
INTRAVENOUS | Status: AC
  Filled 2022-08-29: qty 100

## 2022-08-29 MED ORDER — SODIUM CHLORIDE 0.9 % IV SOLN: INTRAVENOUS | Status: DC

## 2022-08-29 NOTE — OR Nursing (Signed)
PT HAS OPTION OF INTUBATION VERSES CANCELLING PROCEDURE.PT TAKES A DIABETIC DRUG REQUIRING INTUBATION. PT CHOSE TO CANCEL AND RE-SCHEDULE . DR Haig Prophet NOTIFIED

## 2022-09-01 ENCOUNTER — Encounter: Payer: Self-pay | Admitting: Gastroenterology

## 2022-09-03 ENCOUNTER — Other Ambulatory Visit: Payer: Self-pay

## 2022-09-03 MED ORDER — NA SULFATE-K SULFATE-MG SULF 17.5-3.13-1.6 GM/177ML PO SOLN
ORAL | 0 refills | Status: AC
  Filled 2022-09-03: qty 354, 1d supply, fill #0

## 2022-09-05 ENCOUNTER — Other Ambulatory Visit: Payer: Self-pay

## 2022-09-05 DIAGNOSIS — Z6829 Body mass index (BMI) 29.0-29.9, adult: Secondary | ICD-10-CM | POA: Diagnosis not present

## 2022-09-05 DIAGNOSIS — N1831 Chronic kidney disease, stage 3a: Secondary | ICD-10-CM | POA: Diagnosis not present

## 2022-09-05 DIAGNOSIS — E1165 Type 2 diabetes mellitus with hyperglycemia: Secondary | ICD-10-CM | POA: Diagnosis not present

## 2022-09-05 DIAGNOSIS — Z794 Long term (current) use of insulin: Secondary | ICD-10-CM | POA: Diagnosis not present

## 2022-09-05 DIAGNOSIS — M545 Low back pain, unspecified: Secondary | ICD-10-CM | POA: Diagnosis not present

## 2022-09-05 DIAGNOSIS — I10 Essential (primary) hypertension: Secondary | ICD-10-CM | POA: Diagnosis not present

## 2022-09-05 DIAGNOSIS — Z125 Encounter for screening for malignant neoplasm of prostate: Secondary | ICD-10-CM | POA: Diagnosis not present

## 2022-09-05 DIAGNOSIS — M542 Cervicalgia: Secondary | ICD-10-CM | POA: Diagnosis not present

## 2022-09-05 DIAGNOSIS — M25552 Pain in left hip: Secondary | ICD-10-CM | POA: Diagnosis not present

## 2022-09-05 DIAGNOSIS — M25551 Pain in right hip: Secondary | ICD-10-CM | POA: Diagnosis not present

## 2022-09-05 DIAGNOSIS — G8929 Other chronic pain: Secondary | ICD-10-CM | POA: Diagnosis not present

## 2022-09-05 DIAGNOSIS — M722 Plantar fascial fibromatosis: Secondary | ICD-10-CM | POA: Diagnosis not present

## 2022-09-05 MED ORDER — FARXIGA 10 MG PO TABS
10.0000 mg | ORAL_TABLET | Freq: Every day | ORAL | 3 refills | Status: AC
  Filled 2022-09-05: qty 90, 90d supply, fill #0

## 2022-09-15 ENCOUNTER — Other Ambulatory Visit: Payer: Self-pay

## 2022-09-17 ENCOUNTER — Ambulatory Visit (INDEPENDENT_AMBULATORY_CARE_PROVIDER_SITE_OTHER): Payer: 59 | Admitting: Podiatry

## 2022-09-17 DIAGNOSIS — L603 Nail dystrophy: Secondary | ICD-10-CM

## 2022-09-17 DIAGNOSIS — B351 Tinea unguium: Secondary | ICD-10-CM | POA: Diagnosis not present

## 2022-09-17 NOTE — Progress Notes (Signed)
  Subjective:  Patient ID: Brian Butler, male    DOB: 19-Nov-1966,  MRN: [id number]  Chief Complaint  Patient presents with   Nail Problem       NP- Pinky on R foot is discolored.    56 y.o. male presents with the above complaint. History confirmed with patient.  He is very concerned about his right fifth toenails discolored thickened and he would like to have this taken care of he is very concerned about the impact on his health  Objective:  Physical Exam: warm, good capillary refill, no trophic changes or ulcerative lesions, normal DP and PT pulses, normal sensory exam, and thickened dystrophic discolored fifth toenail there is adductovarus contracture of the fifth toe  Assessment:   1. Nail dystrophy   2. Onychomycosis      Plan:  Patient was evaluated and treated and all questions answered.  We discussed etiology treatment options of the onychodystrophy and likely onychomycosis.  I recommended culture and biopsy of the nail plate today and this was done.  We discussed the impact and thickening of the nail plate secondary to the adductovarus contracture.  We discussed treatment options for this if there is fungus including oral topical and laser treatment as well as temporary or permanent total nail avulsion.  I also encouraged him that the impacts overall of onychomycosis on health or systemic effects is incredibly rare and I do not see this being a problem for him.  I will message him with the results of his culture which I advised may take 3 to 5 weeks and we will plan further treatment and follow-up thereafter  Return if symptoms worsen or fail to improve.

## 2022-09-30 ENCOUNTER — Other Ambulatory Visit: Payer: Self-pay

## 2022-09-30 MED ORDER — MOUNJARO 5 MG/0.5ML ~~LOC~~ SOAJ
SUBCUTANEOUS | 3 refills | Status: AC
  Filled 2022-09-30: qty 6, 84d supply, fill #0
  Filled 2022-10-06: qty 2, 28d supply, fill #0

## 2022-10-06 ENCOUNTER — Other Ambulatory Visit: Payer: Self-pay

## 2022-10-06 DIAGNOSIS — M47816 Spondylosis without myelopathy or radiculopathy, lumbar region: Secondary | ICD-10-CM | POA: Diagnosis not present

## 2022-10-06 MED ORDER — TESTOSTERONE 50 MG/5GM (1%) TD GEL
TRANSDERMAL | 5 refills | Status: DC
  Filled 2022-10-06 – 2022-10-14 (×3): qty 150, 30d supply, fill #0
  Filled 2022-10-21: qty 150, 15d supply, fill #0
  Filled 2022-10-28: qty 150, 30d supply, fill #0
  Filled 2022-12-15 – 2022-12-22 (×3): qty 150, 30d supply, fill #1
  Filled 2023-02-22: qty 150, 30d supply, fill #2

## 2022-10-09 ENCOUNTER — Other Ambulatory Visit: Payer: Self-pay

## 2022-10-14 ENCOUNTER — Other Ambulatory Visit: Payer: Self-pay

## 2022-10-16 ENCOUNTER — Other Ambulatory Visit: Payer: Self-pay

## 2022-10-16 DIAGNOSIS — E785 Hyperlipidemia, unspecified: Secondary | ICD-10-CM | POA: Diagnosis not present

## 2022-10-16 DIAGNOSIS — L570 Actinic keratosis: Secondary | ICD-10-CM | POA: Diagnosis not present

## 2022-10-16 DIAGNOSIS — E1159 Type 2 diabetes mellitus with other circulatory complications: Secondary | ICD-10-CM | POA: Diagnosis not present

## 2022-10-16 DIAGNOSIS — E349 Endocrine disorder, unspecified: Secondary | ICD-10-CM | POA: Diagnosis not present

## 2022-10-16 DIAGNOSIS — E1140 Type 2 diabetes mellitus with diabetic neuropathy, unspecified: Secondary | ICD-10-CM | POA: Diagnosis not present

## 2022-10-16 DIAGNOSIS — Z8639 Personal history of other endocrine, nutritional and metabolic disease: Secondary | ICD-10-CM | POA: Diagnosis not present

## 2022-10-16 DIAGNOSIS — I152 Hypertension secondary to endocrine disorders: Secondary | ICD-10-CM | POA: Diagnosis not present

## 2022-10-16 DIAGNOSIS — E1169 Type 2 diabetes mellitus with other specified complication: Secondary | ICD-10-CM | POA: Diagnosis not present

## 2022-10-16 MED ORDER — MOUNJARO 10 MG/0.5ML ~~LOC~~ SOAJ
10.0000 mg | SUBCUTANEOUS | 3 refills | Status: AC
  Filled 2022-12-02: qty 2, 28d supply, fill #0
  Filled 2022-12-22 – 2022-12-23 (×2): qty 2, 28d supply, fill #1
  Filled 2023-01-20: qty 2, 28d supply, fill #2
  Filled 2023-02-22: qty 2, 28d supply, fill #3

## 2022-10-16 MED ORDER — MOUNJARO 7.5 MG/0.5ML ~~LOC~~ SOAJ
SUBCUTANEOUS | 0 refills | Status: AC
  Filled 2022-10-16: qty 2, 28d supply, fill #0

## 2022-10-21 ENCOUNTER — Encounter: Payer: Self-pay | Admitting: Podiatry

## 2022-10-21 ENCOUNTER — Other Ambulatory Visit: Payer: Self-pay

## 2022-10-28 ENCOUNTER — Other Ambulatory Visit: Payer: Self-pay

## 2022-10-28 MED ORDER — ATORVASTATIN CALCIUM 10 MG PO TABS
10.0000 mg | ORAL_TABLET | Freq: Every day | ORAL | 1 refills | Status: DC
  Filled 2022-10-28: qty 90, 90d supply, fill #0
  Filled 2023-01-26: qty 90, 90d supply, fill #1

## 2022-10-29 ENCOUNTER — Other Ambulatory Visit: Payer: Self-pay

## 2022-10-30 ENCOUNTER — Other Ambulatory Visit: Payer: Self-pay

## 2022-10-30 DIAGNOSIS — H6983 Other specified disorders of Eustachian tube, bilateral: Secondary | ICD-10-CM | POA: Diagnosis not present

## 2022-10-30 DIAGNOSIS — H6123 Impacted cerumen, bilateral: Secondary | ICD-10-CM | POA: Diagnosis not present

## 2022-11-06 DIAGNOSIS — N1831 Chronic kidney disease, stage 3a: Secondary | ICD-10-CM | POA: Diagnosis not present

## 2022-11-06 DIAGNOSIS — R809 Proteinuria, unspecified: Secondary | ICD-10-CM | POA: Diagnosis not present

## 2022-11-06 DIAGNOSIS — I10 Essential (primary) hypertension: Secondary | ICD-10-CM | POA: Diagnosis not present

## 2022-11-06 DIAGNOSIS — E1122 Type 2 diabetes mellitus with diabetic chronic kidney disease: Secondary | ICD-10-CM | POA: Diagnosis not present

## 2022-11-20 DIAGNOSIS — L02212 Cutaneous abscess of back [any part, except buttock]: Secondary | ICD-10-CM | POA: Diagnosis not present

## 2022-12-02 ENCOUNTER — Other Ambulatory Visit: Payer: Self-pay

## 2022-12-05 ENCOUNTER — Ambulatory Visit: Payer: Commercial Managed Care - PPO | Admitting: Anesthesiology

## 2022-12-05 ENCOUNTER — Ambulatory Visit
Admission: RE | Admit: 2022-12-05 | Discharge: 2022-12-05 | Disposition: A | Discharge status: Home / Self Care | Payer: Commercial Managed Care - PPO | Attending: Gastroenterology | Admitting: Gastroenterology

## 2022-12-05 ENCOUNTER — Encounter
Admission: RE | Disposition: A | Discharge status: Home / Self Care | Payer: Self-pay | Source: Home / Self Care | Attending: Gastroenterology

## 2022-12-05 ENCOUNTER — Other Ambulatory Visit: Payer: Self-pay

## 2022-12-05 DIAGNOSIS — Z79899 Other long term (current) drug therapy: Secondary | ICD-10-CM | POA: Diagnosis not present

## 2022-12-05 DIAGNOSIS — I129 Hypertensive chronic kidney disease with stage 1 through stage 4 chronic kidney disease, or unspecified chronic kidney disease: Secondary | ICD-10-CM | POA: Diagnosis not present

## 2022-12-05 DIAGNOSIS — E1122 Type 2 diabetes mellitus with diabetic chronic kidney disease: Secondary | ICD-10-CM | POA: Insufficient documentation

## 2022-12-05 DIAGNOSIS — E785 Hyperlipidemia, unspecified: Secondary | ICD-10-CM | POA: Insufficient documentation

## 2022-12-05 DIAGNOSIS — Z79890 Hormone replacement therapy (postmenopausal): Secondary | ICD-10-CM | POA: Insufficient documentation

## 2022-12-05 DIAGNOSIS — Z800 Family history of malignant neoplasm of digestive organs: Secondary | ICD-10-CM | POA: Diagnosis present

## 2022-12-05 DIAGNOSIS — Z7984 Long term (current) use of oral hypoglycemic drugs: Secondary | ICD-10-CM | POA: Diagnosis not present

## 2022-12-05 DIAGNOSIS — G709 Myoneural disorder, unspecified: Secondary | ICD-10-CM | POA: Diagnosis not present

## 2022-12-05 DIAGNOSIS — Z1211 Encounter for screening for malignant neoplasm of colon: Secondary | ICD-10-CM | POA: Insufficient documentation

## 2022-12-05 DIAGNOSIS — Z794 Long term (current) use of insulin: Secondary | ICD-10-CM | POA: Diagnosis not present

## 2022-12-05 DIAGNOSIS — N183 Chronic kidney disease, stage 3 unspecified: Secondary | ICD-10-CM | POA: Diagnosis not present

## 2022-12-05 DIAGNOSIS — K640 First degree hemorrhoids: Secondary | ICD-10-CM | POA: Insufficient documentation

## 2022-12-05 HISTORY — PX: COLONOSCOPY WITH PROPOFOL: SHX5780

## 2022-12-05 LAB — GLUCOSE, CAPILLARY: Glucose-Capillary: 131 mg/dL — ABNORMAL HIGH (ref 70–99)

## 2022-12-05 SURGERY — COLONOSCOPY WITH PROPOFOL
Anesthesia: General

## 2022-12-05 MED ORDER — PROPOFOL 10 MG/ML IV BOLUS
INTRAVENOUS | Status: DC | PRN
  Administered 2022-12-05: 100 mg via INTRAVENOUS

## 2022-12-05 MED ORDER — PROPOFOL 500 MG/50ML IV EMUL
INTRAVENOUS | Status: DC | PRN
  Administered 2022-12-05: 175 ug/kg/min via INTRAVENOUS

## 2022-12-05 MED ORDER — PROPOFOL 1000 MG/100ML IV EMUL
INTRAVENOUS | Status: AC
  Filled 2022-12-05: qty 100

## 2022-12-05 MED ORDER — LIDOCAINE HCL (CARDIAC) PF 100 MG/5ML IV SOSY
PREFILLED_SYRINGE | INTRAVENOUS | Status: DC | PRN
  Administered 2022-12-05: 50 mg via INTRAVENOUS

## 2022-12-05 MED ORDER — LIDOCAINE HCL (PF) 2 % IJ SOLN
INTRAMUSCULAR | Status: AC
  Filled 2022-12-05: qty 5

## 2022-12-05 MED ORDER — SODIUM CHLORIDE 0.9 % IV SOLN
INTRAVENOUS | Status: DC
  Administered 2022-12-05: 20 mL/h via INTRAVENOUS

## 2022-12-05 NOTE — Transfer of Care (Signed)
Immediate Anesthesia Transfer of Care Note  Patient: Brian Butler  Procedure(s) Performed: COLONOSCOPY WITH PROPOFOL  Patient Location: Endoscopy Unit  Anesthesia Type:General  Level of Consciousness: awake and drowsy  Airway & Oxygen Therapy: Patient Spontanous Breathing  Post-op Assessment: Report given to RN and Post -op Vital signs reviewed and stable  Post vital signs: Reviewed and stable  Last Vitals:  Vitals Value Taken Time  BP 117/65 12/05/22 0801  Temp    Pulse 87 12/05/22 0802  Resp 13 12/05/22 0802  SpO2 98 % 12/05/22 0802  Vitals shown include unvalidated device data.  Last Pain:  Vitals:   12/05/22 0658  TempSrc: Temporal  PainSc: 0-No pain         Complications: No notable events documented.

## 2022-12-05 NOTE — Anesthesia Postprocedure Evaluation (Signed)
Anesthesia Post Note  Patient: Brian Butler  Procedure(s) Performed: COLONOSCOPY WITH PROPOFOL  Patient location during evaluation: Endoscopy Anesthesia Type: General Level of consciousness: awake and alert Pain management: pain level controlled Vital Signs Assessment: post-procedure vital signs reviewed and stable Respiratory status: spontaneous breathing, nonlabored ventilation, respiratory function stable and patient connected to nasal cannula oxygen Cardiovascular status: blood pressure returned to baseline and stable Postop Assessment: no apparent nausea or vomiting Anesthetic complications: no   No notable events documented.   Last Vitals:  Vitals:   12/05/22 0811 12/05/22 0821  BP: 119/88 127/70  Pulse: 77 75  Resp: 15 12  Temp:    SpO2: 100% 100%    Last Pain:  Vitals:   12/05/22 0821  TempSrc:   PainSc: 0-No pain                 Precious Haws Aza Dantes

## 2022-12-05 NOTE — Anesthesia Procedure Notes (Signed)
Date/Time: 12/05/2022 7:44 AM  Performed by: Johnna Acosta, CRNAPre-anesthesia Checklist: Patient identified, Emergency Drugs available, Suction available, Patient being monitored and Timeout performed Patient Re-evaluated:Patient Re-evaluated prior to induction Oxygen Delivery Method: Nasal cannula Preoxygenation: Pre-oxygenation with 100% oxygen Induction Type: IV induction
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
## 2022-12-05 NOTE — H&P (Signed)
Outpatient short stay form Pre-procedure 12/05/2022  Lesly Rubenstein, MD  Primary Physician: Tracie Harrier, MD  Reason for visit:  Screening  History of present illness:    57 y/o gentleman here for index screening colonoscopy. Has PMH of DM II, HLD, and hypertension. Family history of unknown type of cancer in his mother. No blood thinners. No significant abdominal surgeries. Has had a couple of episodes of clinically insignificant hematochezia months ago.    Current Facility-Administered Medications:    0.9 %  sodium chloride infusion, , Intravenous, Continuous, Klover Priestly, Hilton Cork, MD, Last Rate: 20 mL/hr at 12/05/22 [id number], Continued from Pre-op at 12/05/22 [id number]  Medications Prior to Admission  Medication Sig Dispense Refill Last Dose   aspirin EC 81 MG tablet Take 81 mg by mouth daily.   Past Week   atorvastatin (LIPITOR) 10 MG tablet Take 1 tablet (10 mg total) by mouth daily at 6 PM. (Patient taking differently: Take 10 mg by mouth every evening.) 90 tablet 3 Past Week   atorvastatin (LIPITOR) 10 MG tablet Take 1 tablet (10 mg total) by mouth once daily for 180 days 90 tablet 1 Past Week   atorvastatin (LIPITOR) 10 MG tablet TAKE 1 TABLET BY MOUTH ONCE DAILY 90 tablet 1 Past Week   atorvastatin (LIPITOR) 10 MG tablet Take 1 tablet (10 mg total) by mouth once daily 90 tablet 1 Past Week   azithromycin (ZITHROMAX) 250 MG tablet Take 2 tablets (500mg ) by mouth on Day 1. Take 1 tablet (250mg ) by mouth on Days 2-5. 6 tablet 0 Past Week   bromocriptine (PARLODEL) 2.5 MG tablet 1/4 tab daily 25 tablet 3 Past Week   dapagliflozin propanediol (FARXIGA) 10 MG TABS tablet Take 10 mg by mouth 1 (one) time each day 30 tablet 11 Past Week   glimepiride (AMARYL) 1 MG tablet Take 1 tablet (1 mg total) by mouth daily with breakfast. 30 tablet 11 Past Week   glimepiride (AMARYL) 2 MG tablet Take 1 tablet (2 mg total) by mouth daily with breakfast 30 tablet 11 Past Week   hydrALAZINE (APRESOLINE)  25 MG tablet Take 1 tablet (25 mg total) by mouth 2 (two) times daily 180 tablet 1 Past Week   polyethylene glycol-electrolytes (NULYTELY) 420 g solution Use as directed. 4000 mL 0 Past Week   potassium citrate (UROCIT-K) 10 MEQ (1080 MG) SR tablet Take 1 tablet (10 mEq total) by mouth 3 (three) times daily. 270 tablet 1 Past Week   tirzepatide (MOUNJARO) 5 MG/0.5ML Pen Inject 0.5 mLs (5 mg total) subcutaneously every 7 (seven) days 2 mL 6 Past Week   tirzepatide (MOUNJARO) 5 MG/0.5ML Pen Inject 0.5 mLs (5 mg total) subcutaneously every 7 (seven) days 6 mL 3 Past Week   tirzepatide (MOUNJARO) 7.5 MG/0.5ML Pen Inject 0.5 mLs (7.5 mg total) subcutaneously every 7 (seven) days 2 mL 0 Past Week   albuterol (VENTOLIN HFA) 108 (90 Base) MCG/ACT inhaler INHALE 2 PUFFS BY MOUTH EVERY 6 HOURS AS NEEDED (Patient not taking: Reported on 08/29/2022) 18 g 2    amitriptyline (ELAVIL) 10 MG tablet TAKE 1 TABLET (10 MG TOTAL) BY MOUTH NIGHTLY (Patient not taking: Reported on 08/29/2022) 90 tablet 1    amoxicillin (AMOXIL) 875 MG tablet Take 1 tablet (875 mg total) by mouth every 12 (twelve) hours for 10 days (Patient not taking: Reported on 08/29/2022) 20 tablet 0    atorvastatin (LIPITOR) 10 MG tablet TAKE 1 TABLET BY MOUTH ONCE DAILY 90 tablet 1  Continuous Blood Gluc Sensor (DEXCOM G6 SENSOR) MISC Replace sensor every 10 days (Patient not taking: Reported on 12/05/2022) 3 each 11 Completed Course   Continuous Blood Gluc Sensor (DEXCOM G7 SENSOR) MISC Use 1 each every 10 (ten) days 3 each 12    Continuous Blood Gluc Sensor (FREESTYLE LIBRE 14 DAY SENSOR) MISC Use 1 Kit every 14 days 2 each 11    Continuous Blood Gluc Sensor (FREESTYLE LIBRE 14 DAY SENSOR) MISC USE AS DIRECTED EVERY 14 DAYS FOR GLUCOSE MONITORING. 2 each 11    Continuous Blood Gluc Sensor (FREESTYLE LIBRE 14 DAY SENSOR) MISC USE AS DIRECTED EVERY 14 DAYS FOR GLUCOSE MONITORING. 2 each 11    Continuous Blood Gluc Sensor (FREESTYLE LIBRE 2 SENSOR) MISC  Use 1 kit every 14 (fourteen) days for glucose monitoring 2 each 12    Continuous Blood Gluc Transmit (DEXCOM G6 TRANSMITTER) MISC Replace transmitter every 90 days 1 each 3    FARXIGA 10 MG TABS tablet Take 1 tablet (10 mg total) by mouth once daily (Patient not taking: Reported on 12/05/2022) 90 tablet 3 Not Taking   FARXIGA 10 MG TABS tablet Take 1 tablet (10 mg total) by mouth once daily 90 tablet 3    gabapentin (NEURONTIN) 300 MG capsule TAKE 1 CAPSULE BY MOUTH TWICE DAILY AND 2 TABLETS AT BEDTIME (Patient not taking: Reported on 08/29/2022) 360 capsule 1    glimepiride (AMARYL) 4 MG tablet TAKE 1 TABLET (4 MG TOTAL) BY MOUTH DAILY WITH BREAKFAST 90 tablet 3    hydrALAZINE (APRESOLINE) 25 MG tablet TAKE 1 TABLET (25 MG TOTAL) BY MOUTH 2 (TWO) TIMES DAILY 180 tablet 1    hydrALAZINE (APRESOLINE) 25 MG tablet TAKE 1 TABLET BY MOUTH 2 TIMES DAILY 180 tablet 1    hydrALAZINE (APRESOLINE) 25 MG tablet TAKE 1 TABLET BY MOUTH TWICE DAILY 180 tablet 1    HYDROcodone bit-homatropine (HYCODAN) 5-1.5 MG/5ML syrup Take 5 mLs by mouth every 6 (six) hours as needed for up to 10 days 70 mL 0    hydrocortisone cream 1 % Apply topically 3 (three) times daily for 10 days 28 g 0    insulin degludec (TRESIBA FLEXTOUCH) 200 UNIT/ML FlexTouch Pen Use 38 units daily.  Titrate as needed to max daily dose of 60 units daily 9 mL 6    Insulin Degludec FlexTouch 200 UNIT/ML SOPN Inject 64 units daily  Titrate as needed to max daily dose of 66 units daily 9 mL 11    Insulin Pen Needle (BD PEN NEEDLE NANO 2ND GEN) 32G X 4 MM MISC Use one needle once daily 100 each 3    Insulin Pen Needle (UNIFINE PENTIPS) 32G X 4 MM MISC Use as directed once daily 100 each 3    lisinopril (PRINIVIL,ZESTRIL) 20 MG tablet Take 20 mg by mouth daily.   3    Na Sulfate-K Sulfate-Mg Sulf 17.5-3.13-1.6 GM/177ML SOLN Take 1 Bottle by mouth as directed One kit contains 2 bottles.  Take both bottles at the times instructed by your provider. 354 mL 0     nortriptyline (PAMELOR) 25 MG capsule Take by mouth.      olmesartan (BENICAR) 40 MG tablet Take 1 tablet (40 mg total) by mouth once daily 90 tablet 1    olmesartan (BENICAR) 40 MG tablet TAKE 1 TABLET BY MOUTH ONCE DAILY 90 tablet 1    olmesartan (BENICAR) 40 MG tablet TAKE 1 TABLET BY MOUTH ONCE DAILY 90 tablet 1    polyethylene  glycol-electrolytes (NULYTELY) 420 g solution Take 4,000 mLs by mouth as directed (Patient not taking: Reported on 08/29/2022) 4000 mL 0    potassium citrate (UROCIT-K) 10 MEQ (1080 MG) SR tablet Take 1 tablet (10 mEq total) by mouth 3 (three) times daily. (Patient not taking: Reported on 12/05/2022) 270 tablet 1 Not Taking   sildenafil (VIAGRA) 100 MG tablet Take 1 tablet (100 mg total) by mouth once daily as needed for Erectile Dysfunction for up to 30 days 10 tablet 2    silver sulfADIAZINE (SILVADENE) 1 % cream Apply 1 application topically daily. 50 g 0    testosterone (ANDROGEL) 50 MG/5GM (1%) GEL APPLY 1 TUBE (5 GM) TO SKIN ONCE DAILY AS DIRECTED 150 g 5    testosterone (ANDROGEL) 50 MG/5GM (1%) GEL APPLY 1 TUBE (5 GM) TO SKIN ONCE DAILY AS DIRECTED 150 g 5    testosterone (ANDROGEL) 50 MG/5GM (1%) GEL Apply 100 mg of testosterone topically once daily Apply to the shoulders and/or upper arms. 150 g 5    testosterone (ANDROGEL) 50 MG/5GM (1%) GEL Apply 100 mg of testosterone topically once daily Apply to the shoulders and/or upper arms. 150 g 5    tirzepatide (MOUNJARO) 10 MG/0.5ML Pen Inject 10 mg into the skin once a week. 6 mL 3    traZODone (DESYREL) 50 MG tablet Take 50 mg by mouth at bedtime.         Allergies  Allergen Reactions   Cymbalta [Duloxetine Hcl] Other (See Comments)    insomnia   Ozempic (0.25 Or 0.5 Mg-Dose) [Semaglutide(0.25 Or 0.5mg -Dos)]      Past Medical History:  Diagnosis Date   Arthritis    Chronic neck and back pain    CKD (chronic kidney disease), stage III [facility]) nephrologist-  dr deterding   Hx of hypotestosteronemia 09/05/2015    Hyperlipidemia    Hypertension    Left ureteral stone    Nephrolithiasis    right non-obstructive per CT 08/ 2018 urology office   Nocturia    Peripheral neuropathy    Plantar fasciitis, bilateral    Renal insufficiency    Type 2 diabetes mellitus ([place])     Review of systems:  Otherwise negative.    Physical Exam  Gen: Alert, oriented. Appears stated age.  HEENT: PERRLA. Lungs: No respiratory distress CV: RRR Abd: soft, benign, no masses Ext: No edema    Planned procedures: Proceed with colonoscopy. The patient understands the nature of the planned procedure, indications, risks, alternatives and potential complications including but not limited to bleeding, infection, perforation, damage to internal organs and possible oversedation/side effects from anesthesia. The patient agrees and gives consent to proceed.  Please refer to procedure notes for findings, recommendations and patient disposition/instructions.     Lesly Rubenstein, MD [facility]

## 2022-12-05 NOTE — Brief Op Note (Signed)
Pt refused wheelchair for discharge to car. Gait steady. Pt walked to front entrance escorted by RN. Into car without incident

## 2022-12-05 NOTE — Interval H&P Note (Signed)
History and Physical Interval Note:  12/05/2022 7:41 AM  Brian Butler  has presented today for surgery, with the diagnosis of FH Colorectal cancer.  The various methods of treatment have been discussed with the patient and family. After consideration of risks, benefits and other options for treatment, the patient has consented to  Procedure(s): COLONOSCOPY WITH PROPOFOL (N/A) as a surgical intervention.  The patient's history has been reviewed, patient examined, no change in status, stable for surgery.  I have reviewed the patient's chart and labs.  Questions were answered to the patient's satisfaction.     Lesly Rubenstein  Ok to proceed with colonoscopy

## 2022-12-05 NOTE — Op Note (Signed)
[facility] Patient Name: Brian Butler Procedure Date: 12/05/2022 7:16 AM MRN: [id number] Account #: [id number] Date of Birth: 1966-05-21 Admit Type: Outpatient Age: 57 Room: [facility] ENDO ROOM 3 Gender: Male Note Status: Supervisor Override Instrument Name: Park Meo [id number] Procedure:             Colonoscopy Indications:           Screening for colorectal malignant neoplasm, Family                         history of colon cancer Providers:             Andrey Farmer MD, MD Referring MD:          Tracie Harrier, MD (Referring MD) Medicines:             Monitored Anesthesia Care Complications:         No immediate complications. Procedure:             Pre-Anesthesia Assessment:                        - Prior to the procedure, a History and Physical was                         performed, and patient medications and allergies were                         reviewed. The patient is competent. The risks and                         benefits of the procedure and the sedation options and                         risks were discussed with the patient. All questions                         were answered and informed consent was obtained.                         Patient identification and proposed procedure were                         verified by the physician, the nurse, the                         anesthesiologist, the anesthetist and the technician                         in the endoscopy suite. Mental Status Examination:                         alert and oriented. Airway Examination: normal                         oropharyngeal airway and neck mobility. Respiratory                         Examination: clear to auscultation. CV Examination:  normal. Prophylactic Antibiotics: The patient does not                         require prophylactic antibiotics. Prior                         Anticoagulants: The patient has taken no  anticoagulant                         or antiplatelet agents. ASA Grade Assessment: III - A                         patient with severe systemic disease. After reviewing                         the risks and benefits, the patient was deemed in                         satisfactory condition to undergo the procedure. The                         anesthesia plan was to use monitored anesthesia care                         (MAC). Immediately prior to administration of                         medications, the patient was re-assessed for adequacy                         to receive sedatives. The heart rate, respiratory                         rate, oxygen saturations, blood pressure, adequacy of                         pulmonary ventilation, and response to care were                         monitored throughout the procedure. The physical                         status of the patient was re-assessed after the                         procedure.                        After obtaining informed consent, the colonoscope was                         passed under direct vision. Throughout the procedure,                         the patient's blood pressure, pulse, and oxygen                         saturations were monitored continuously. The  Colonoscope was introduced through the anus and                         advanced to the the cecum, identified by appendiceal                         orifice and ileocecal valve. The colonoscopy was                         performed without difficulty. The patient tolerated                         the procedure well. The quality of the bowel                         preparation was inadequate. The ileocecal valve,                         appendiceal orifice, and rectum were photographed. Findings:      The perianal and digital rectal examinations were normal.      A moderate amount of semi-solid stool was found in the sigmoid colon and       in  the ascending colon, making visualization difficult.      Anal papilla(e) were hypertrophied.      Internal hemorrhoids were found during retroflexion. The hemorrhoids       were Grade I (internal hemorrhoids that do not prolapse).      The exam was otherwise without abnormality on direct and retroflexion       views. Impression:            - Preparation of the colon was inadequate.                        - Stool in the sigmoid colon and in the ascending                         colon.                        - Anal papilla(e) were hypertrophied.                        - Internal hemorrhoids.                        - The examination was otherwise normal on direct and                         retroflexion views.                        - No specimens collected. Recommendation:        - Discharge patient to home.                        - Resume previous diet.                        - Continue present medications.                        -  Repeat colonoscopy in 6-12 months because the bowel                         preparation was suboptimal.                        - Return to referring physician as previously                         scheduled. Procedure Code(s):     --- Professional ---                        Q6834, Colorectal cancer screening; colonoscopy on                         individual not meeting criteria for high risk Diagnosis Code(s):     --- Professional ---                        Z12.11, Encounter for screening for malignant neoplasm                         of colon                        K64.0, First degree hemorrhoids                        K62.89, Other specified diseases of anus and rectum CPT copyright 2022 [employer]. All rights reserved. The codes documented in this report are preliminary and upon coder review may  be revised to meet current compliance requirements. Andrey Farmer MD, MD 12/05/2022 8:03:49 AM Number of Addenda: 0 Note Initiated On:  12/05/2022 7:16 AM Scope Withdrawal Time: 0 hours 6 minutes 41 seconds  Total Procedure Duration: 0 hours 9 minutes 12 seconds  Estimated Blood Loss:  Estimated blood loss: none.      [facility]

## 2022-12-06 NOTE — Progress Notes (Signed)
Non-identified Voicemail.  No Message Left. 

## 2022-12-08 ENCOUNTER — Encounter: Payer: Self-pay | Admitting: Gastroenterology

## 2022-12-15 ENCOUNTER — Other Ambulatory Visit: Payer: Self-pay

## 2022-12-15 MED ORDER — FAMOTIDINE 20 MG PO TABS
20.0000 mg | ORAL_TABLET | Freq: Every day | ORAL | 2 refills | Status: AC
  Filled 2022-12-15: qty 30, 30d supply, fill #0

## 2022-12-15 MED ORDER — OLMESARTAN MEDOXOMIL 40 MG PO TABS
40.0000 mg | ORAL_TABLET | Freq: Every day | ORAL | 1 refills | Status: DC
  Filled 2022-12-15: qty 90, 90d supply, fill #0
  Filled 2023-03-13 (×2): qty 90, 90d supply, fill #1

## 2022-12-15 MED ORDER — ESOMEPRAZOLE MAGNESIUM 20 MG PO CPDR
20.0000 mg | DELAYED_RELEASE_CAPSULE | Freq: Every day | ORAL | 1 refills | Status: DC
  Filled 2022-12-15: qty 90, 90d supply, fill #0
  Filled 2023-09-02: qty 90, 90d supply, fill #1

## 2022-12-18 ENCOUNTER — Telehealth: Payer: Self-pay | Admitting: Podiatry

## 2022-12-18 NOTE — Telephone Encounter (Signed)
Patients results were found and scanned into his chart.

## 2022-12-18 NOTE — Telephone Encounter (Signed)
Pt called stating he had a toenail sample taken on 10.18.2023 and has not heard anything back.He said he has left message for the nurse prior to as well.

## 2022-12-19 ENCOUNTER — Telehealth: Payer: Self-pay | Admitting: Podiatry

## 2022-12-19 ENCOUNTER — Other Ambulatory Visit: Payer: Self-pay

## 2022-12-19 MED ORDER — TERBINAFINE HCL 250 MG PO TABS
250.0000 mg | ORAL_TABLET | Freq: Every day | ORAL | 0 refills | Status: AC
  Filled 2022-12-19: qty 90, 90d supply, fill #0

## 2022-12-19 NOTE — Telephone Encounter (Signed)
Pt Called saying he wanted to go with the Rx [facility] suggested for Oral medication. Pt stated he wanted medication sent to [facility]

## 2022-12-19 NOTE — Telephone Encounter (Signed)
Lamisil 90 tablets sent to pharmacy

## 2022-12-22 ENCOUNTER — Other Ambulatory Visit (HOSPITAL_COMMUNITY): Payer: Self-pay

## 2022-12-22 ENCOUNTER — Other Ambulatory Visit: Payer: Self-pay

## 2022-12-23 ENCOUNTER — Other Ambulatory Visit: Payer: Self-pay

## 2022-12-23 MED ORDER — MUPIROCIN 2 % EX OINT
1.0000 | TOPICAL_OINTMENT | Freq: Three times a day (TID) | CUTANEOUS | 0 refills | Status: AC
  Filled 2022-12-23: qty 22, 14d supply, fill #0

## 2023-01-08 ENCOUNTER — Other Ambulatory Visit: Payer: Self-pay

## 2023-01-20 ENCOUNTER — Other Ambulatory Visit: Payer: Self-pay

## 2023-01-26 ENCOUNTER — Other Ambulatory Visit: Payer: Self-pay

## 2023-01-30 ENCOUNTER — Other Ambulatory Visit: Payer: Self-pay

## 2023-02-02 ENCOUNTER — Other Ambulatory Visit: Payer: Self-pay

## 2023-02-05 ENCOUNTER — Other Ambulatory Visit: Payer: Self-pay

## 2023-02-05 MED ORDER — TRIAMCINOLONE ACETONIDE 0.1 % EX CREA
1.0000 | TOPICAL_CREAM | Freq: Two times a day (BID) | CUTANEOUS | 1 refills | Status: AC | PRN
  Filled 2023-02-05: qty 80, 40d supply, fill #0
  Filled 2023-09-02: qty 80, 40d supply, fill #1

## 2023-02-11 ENCOUNTER — Other Ambulatory Visit: Payer: Self-pay

## 2023-02-23 ENCOUNTER — Other Ambulatory Visit: Payer: Self-pay

## 2023-03-02 ENCOUNTER — Other Ambulatory Visit: Payer: Self-pay

## 2023-03-10 ENCOUNTER — Other Ambulatory Visit: Payer: Self-pay

## 2023-03-12 ENCOUNTER — Other Ambulatory Visit: Payer: Self-pay

## 2023-03-13 ENCOUNTER — Other Ambulatory Visit: Payer: Self-pay

## 2023-03-13 ENCOUNTER — Other Ambulatory Visit (HOSPITAL_COMMUNITY): Payer: Self-pay

## 2023-03-13 MED ORDER — FARXIGA 10 MG PO TABS
10.0000 mg | ORAL_TABLET | Freq: Every day | ORAL | 3 refills | Status: DC
  Filled 2023-03-13: qty 90, 90d supply, fill #0
  Filled 2023-06-08: qty 90, 90d supply, fill #1
  Filled 2023-08-26: qty 90, 90d supply, fill #2
  Filled 2023-12-07: qty 90, 90d supply, fill #3

## 2023-03-16 ENCOUNTER — Other Ambulatory Visit: Payer: Self-pay

## 2023-03-16 MED ORDER — MOUNJARO 12.5 MG/0.5ML ~~LOC~~ SOAJ
12.5000 mg | SUBCUTANEOUS | 3 refills | Status: DC
  Filled 2023-03-16: qty 2, 28d supply, fill #0
  Filled 2023-05-12: qty 2, 28d supply, fill #1

## 2023-04-02 ENCOUNTER — Other Ambulatory Visit: Payer: Self-pay

## 2023-04-02 MED ORDER — LOSARTAN POTASSIUM 25 MG PO TABS
25.0000 mg | ORAL_TABLET | Freq: Every day | ORAL | 11 refills | Status: DC
  Filled 2023-04-02: qty 30, 30d supply, fill #0
  Filled 2023-04-30 – 2023-05-12 (×2): qty 30, 30d supply, fill #1
  Filled 2023-06-08: qty 30, 30d supply, fill #2
  Filled 2023-07-09 – 2023-09-02 (×2): qty 30, 30d supply, fill #3
  Filled 2023-09-29: qty 30, 30d supply, fill #4
  Filled 2023-10-28 – 2023-11-13 (×2): qty 30, 30d supply, fill #5
  Filled 2024-01-12: qty 30, 30d supply, fill #6
  Filled 2024-02-11 – 2024-03-09 (×3): qty 30, 30d supply, fill #7

## 2023-04-08 ENCOUNTER — Other Ambulatory Visit: Payer: Self-pay

## 2023-04-08 MED ORDER — MOUNJARO 15 MG/0.5ML ~~LOC~~ SOAJ
15.0000 mg | SUBCUTANEOUS | 6 refills | Status: DC
  Filled 2023-04-08: qty 2, 28d supply, fill #0
  Filled 2023-05-10 – 2023-06-08 (×2): qty 2, 28d supply, fill #1
  Filled 2023-06-30: qty 2, 28d supply, fill #2
  Filled 2023-08-04 (×2): qty 2, 28d supply, fill #3
  Filled 2023-08-04 – 2023-08-07 (×3): qty 2, 28d supply, fill #0
  Filled 2023-09-02: qty 2, 28d supply, fill #1
  Filled 2023-09-29: qty 2, 28d supply, fill #2
  Filled 2023-10-26: qty 2, 28d supply, fill #3

## 2023-04-13 ENCOUNTER — Other Ambulatory Visit: Payer: Self-pay

## 2023-04-13 MED ORDER — ATORVASTATIN CALCIUM 10 MG PO TABS
10.0000 mg | ORAL_TABLET | Freq: Every day | ORAL | 1 refills | Status: DC
  Filled 2023-04-13: qty 90, 90d supply, fill #0
  Filled 2023-07-09 – 2023-08-04 (×2): qty 90, 90d supply, fill #1

## 2023-04-14 ENCOUNTER — Other Ambulatory Visit: Payer: Self-pay

## 2023-04-14 MED ORDER — TESTOSTERONE 50 MG/5GM (1%) TD GEL
10.0000 g | Freq: Every day | TRANSDERMAL | 5 refills | Status: DC
  Filled 2023-04-14: qty 150, 15d supply, fill #0
  Filled 2023-06-08: qty 150, 15d supply, fill #1
  Filled 2023-07-21: qty 150, 15d supply, fill #2
  Filled 2023-08-26: qty 150, 15d supply, fill #3
  Filled 2023-10-09: qty 150, 15d supply, fill #4

## 2023-04-15 ENCOUNTER — Other Ambulatory Visit: Payer: Self-pay

## 2023-04-30 ENCOUNTER — Other Ambulatory Visit: Payer: Self-pay

## 2023-04-30 MED ORDER — TRESIBA FLEXTOUCH 200 UNIT/ML ~~LOC~~ SOPN
PEN_INJECTOR | SUBCUTANEOUS | 11 refills | Status: DC
  Filled 2023-04-30 – 2023-05-12 (×2): qty 9, 27d supply, fill #0
  Filled 2023-06-08: qty 9, 27d supply, fill #1
  Filled 2023-08-04: qty 9, 27d supply, fill #2
  Filled 2023-09-02: qty 9, 27d supply, fill #3
  Filled 2023-10-09: qty 9, 27d supply, fill #4
  Filled 2023-11-13: qty 9, 27d supply, fill #5
  Filled 2024-01-12: qty 9, 27d supply, fill #6
  Filled 2024-02-11 – 2024-02-24 (×2): qty 9, 27d supply, fill #7
  Filled 2024-04-08: qty 9, 27d supply, fill #8

## 2023-04-30 MED ORDER — GLIMEPIRIDE 4 MG PO TABS
4.0000 mg | ORAL_TABLET | Freq: Every day | ORAL | 3 refills | Status: DC
  Filled 2023-04-30 – 2023-05-12 (×2): qty 90, 90d supply, fill #0
  Filled 2023-08-04: qty 90, 90d supply, fill #1
  Filled 2023-11-13: qty 90, 90d supply, fill #2
  Filled 2024-02-11 – 2024-02-24 (×2): qty 90, 90d supply, fill #3

## 2023-05-12 ENCOUNTER — Other Ambulatory Visit: Payer: Self-pay

## 2023-06-08 ENCOUNTER — Other Ambulatory Visit: Payer: Self-pay

## 2023-06-09 ENCOUNTER — Other Ambulatory Visit: Payer: Self-pay

## 2023-06-09 MED ORDER — POTASSIUM CITRATE ER 10 MEQ (1080 MG) PO TBCR
10.0000 meq | EXTENDED_RELEASE_TABLET | Freq: Three times a day (TID) | ORAL | 1 refills | Status: DC
  Filled 2023-06-09: qty 270, 90d supply, fill #0
  Filled 2023-09-02: qty 270, 90d supply, fill #1

## 2023-06-30 ENCOUNTER — Other Ambulatory Visit: Payer: Self-pay

## 2023-07-01 ENCOUNTER — Other Ambulatory Visit: Payer: Self-pay

## 2023-07-09 ENCOUNTER — Other Ambulatory Visit: Payer: Self-pay

## 2023-07-17 ENCOUNTER — Other Ambulatory Visit: Payer: Self-pay

## 2023-07-21 ENCOUNTER — Ambulatory Visit
Admission: EM | Admit: 2023-07-21 | Discharge: 2023-07-21 | Disposition: A | Discharge status: Home / Self Care | Payer: Medicare Other

## 2023-07-21 ENCOUNTER — Encounter: Payer: Self-pay | Admitting: Emergency Medicine

## 2023-07-21 ENCOUNTER — Other Ambulatory Visit: Payer: Self-pay

## 2023-07-21 DIAGNOSIS — R21 Rash and other nonspecific skin eruption: Secondary | ICD-10-CM

## 2023-07-21 DIAGNOSIS — W57XXXA Bitten or stung by nonvenomous insect and other nonvenomous arthropods, initial encounter: Secondary | ICD-10-CM | POA: Diagnosis not present

## 2023-07-21 MED ORDER — HYDROXYZINE HCL 25 MG PO TABS
25.0000 mg | ORAL_TABLET | Freq: Every evening | ORAL | 0 refills | Status: AC | PRN
  Filled 2023-07-21: qty 10, 10d supply, fill #0

## 2023-07-21 MED ORDER — CETIRIZINE HCL 10 MG PO TABS
10.0000 mg | ORAL_TABLET | Freq: Every day | ORAL | 0 refills | Status: AC
  Filled 2023-07-21: qty 14, 14d supply, fill #0

## 2023-07-21 MED ORDER — PREDNISONE 10 MG PO TABS
ORAL_TABLET | ORAL | 0 refills | Status: AC
  Filled 2023-07-21: qty 21, 6d supply, fill #0

## 2023-07-21 MED ORDER — OLMESARTAN MEDOXOMIL 40 MG PO TABS
40.0000 mg | ORAL_TABLET | Freq: Every day | ORAL | 1 refills | Status: AC
  Filled 2023-07-21: qty 90, 90d supply, fill #0
  Filled 2023-10-26: qty 90, 90d supply, fill #1

## 2023-07-21 NOTE — ED Provider Notes (Signed)
Brian Butler    CSN: [id number] Arrival date & time: 07/21/23  6606      History   Chief Complaint Chief Complaint  Patient presents with   Rash    HPI Brian Butler is a 57 y.o. male.  Patient presents with pruritic rash on his legs, groin, and abdomen x 3 days.  The rash started after he was in the woods.  No new products, medications, foods.  He has been treating it with topical anti-itch lotion.  He denies fever, numbness, weakness, sore throat, cough, shortness of breath, difficulty swallowing, or other symptoms.  His medical history includes diabetes, hypertension, CKD, osteoarthritis, chronic low back pain, kidney stones, chronic pain syndrome.  The history is provided by the patient and medical records.    Past Medical History:  Diagnosis Date   Arthritis    Chronic neck and back pain    CKD (chronic kidney disease), stage III [facility]) nephrologist-  dr deterding   Hx of hypotestosteronemia 09/05/2015   Hyperlipidemia    Hypertension    Left ureteral stone    Nephrolithiasis    right non-obstructive per CT 08/ 2018 urology office   Nocturia    Peripheral neuropathy    Plantar fasciitis, bilateral    Renal insufficiency    Type 2 diabetes mellitus (HCC)     Patient Active Problem List   Diagnosis Date Noted   Degenerative disc disease, cervical 01/21/2018   S/P cervical spinal fusion 01/21/2018   Cervicalgia 01/21/2018   Chronic pain syndrome 01/21/2018   Diabetes (HCC) 09/18/2016   Biceps tendonitis on left 08/07/2016   Pigmented skin lesion of uncertain nature 08/07/2016   Neck muscle spasm 08/07/2016   Plantar fasciitis 05/30/2016   Chronic heel pain 05/30/2016   Hyperlipidemia associated with type 2 diabetes mellitus (HCC) 04/30/2016   Chronic low back pain 04/30/2016   Overweight (BMI 25.0-29.9) 04/30/2016   Abnormal nuclear cardiac imaging test 04/23/2016   Unstable angina (HCC)    Essential hypertension 09/05/2015   Nephrolithiasis  09/05/2015   Hx of hypotestosteronemia 09/05/2015    Past Surgical History:  Procedure Laterality Date   ANTERIOR CERVICAL DECOMP/DISCECTOMY FUSION  2006  approx.   C4 --5   CARDIAC CATHETERIZATION N/A 04/23/2016   Procedure: Left Heart Cath and Coronary Angiography;  Surgeon: Lyn Records, MD;  Location: [facility] INVASIVE CV LAB;  Service: Cardiovascular;  Laterality: N/A;  abnormal nuclear study and precordial pain:  normal coronary arteries, normal LV size, funciton (ef 55-65%) and filling pressures (LVEDP is )   CARDIOVASCULAR STRESS TEST  04-22-2016   dr Verdis Prime-- normal LV function and wall motion , ef 55-65%   Inermediate nuclear study w/ reversible small defect of mild severity present in the mid anterior location and concerning for a very small area of ischemia mid anterior wall;  partially reversible medium defect of mild severity in the basal anterosetpal, mid anteroseptal , and apical septal locasion that could be soft tissue attenuation but also could be concerning for small area of ischemia   COLONOSCOPY WITH PROPOFOL N/A 08/29/2022   Procedure: COLONOSCOPY WITH PROPOFOL;  Surgeon: Regis Bill, MD;  Location: [facility] ENDOSCOPY;  Service: Endoscopy;  Laterality: N/A;   COLONOSCOPY WITH PROPOFOL N/A 12/05/2022   Procedure: COLONOSCOPY WITH PROPOFOL;  Surgeon: Regis Bill, MD;  Location: [facility] ENDOSCOPY;  Service: Endoscopy;  Laterality: N/A;   CYSTOSCOPY W/ RETROGRADES Left 01/16/2016   Procedure: CYSTOSCOPY WITH RETROGRADE PYELOGRAM;  Surgeon: Hildred Laser,  MD;  Location: [facility] ORS;  Service: Urology;  Laterality: Left;   CYSTOSCOPY WITH RETROGRADE PYELOGRAM, URETEROSCOPY AND STENT PLACEMENT Left 09/18/2017   Procedure: CYSTOSCOPY WITH RETROGRADE PYELOGRAM, URETEROSCOPY AND STENT PLACEMENT;  Surgeon: Crista Elliot, MD;  Location: [facility];  Service: Urology;  Laterality: Left;   CYSTOSCOPY WITH STENT PLACEMENT Left 12/05/2015   Procedure:  CYSTOSCOPY WITH STENT PLACEMENT;  Surgeon: Hildred Laser, MD;  Location: [facility] ORS;  Service: Urology;  Laterality: Left;   CYSTOSCOPY WITH STENT PLACEMENT Left 01/16/2016   Procedure: CYSTOSCOPY WITH STENT PLACEMENT/ EXCHANGE;  Surgeon: Hildred Laser, MD;  Location: [facility] ORS;  Service: Urology;  Laterality: Left;   CYSTOSCOPY/URETEROSCOPY/HOLMIUM LASER/STENT PLACEMENT Left 10/20/2016   Procedure: CYSTOSCOPY/URETEROSCOPY/HOLMIUM LASER/STENT PLACEMENT;  Surgeon: Vanna Scotland, MD;  Location: [facility] ORS;  Service: Urology;  Laterality: Left;   EXTRACORPOREAL SHOCK WAVE LITHOTRIPSY Left 09/20/2015   Procedure: EXTRACORPOREAL SHOCK WAVE LITHOTRIPSY (ESWL);  Surgeon: Hildred Laser, MD;  Location: [facility] ORS;  Service: Urology;  Laterality: Left;   EXTRACORPOREAL SHOCK WAVE LITHOTRIPSY Left 11/15/2015   Procedure: EXTRACORPOREAL SHOCK WAVE LITHOTRIPSY (ESWL);  Surgeon: Vanna Scotland, MD;  Location: [facility] ORS;  Service: Urology;  Laterality: Left;   HOLMIUM LASER APPLICATION Left 09/18/2017   Procedure: HOLMIUM LASER APPLICATION;  Surgeon: Crista Elliot, MD;  Location: [facility];  Service: Urology;  Laterality: Left;   URETEROSCOPY WITH HOLMIUM LASER LITHOTRIPSY Left 12/05/2015   Procedure: URETEROSCOPY WITH HOLMIUM LASER LITHOTRIPSY;  Surgeon: Hildred Laser, MD;  Location: [facility] ORS;  Service: Urology;  Laterality: Left;   URETEROSCOPY WITH HOLMIUM LASER LITHOTRIPSY Left 01/16/2016   Procedure: URETEROSCOPY WITH HOLMIUM LASER LITHOTRIPSY;  Surgeon: Hildred Laser, MD;  Location: [facility] ORS;  Service: Urology;  Laterality: Left;       Home Medications    Prior to Admission medications   Medication Sig Start Date End Date Taking? Authorizing Provider  cetirizine (ZYRTEC ALLERGY) 10 MG tablet Take 1 tablet (10 mg total) by mouth daily for 14 days. 07/21/23 08/04/23 Yes Mickie Bail, NP  Cholecalciferol (VITAMIN D3) 10 MCG (400 UNIT) tablet Take by mouth. 09/23/19  Yes  [provider]  hydrOXYzine (ATARAX) 25 MG tablet Take 1 tablet (25 mg total) by mouth at bedtime as needed. 07/21/23  Yes Mickie Bail, NP  insulin glargine, 1 Unit Dial, (TOUJEO SOLOSTAR) 300 UNIT/ML Solostar Pen Inject into the skin. 11/21/19  Yes [provider]  metoprolol succinate (TOPROL-XL) 25 MG 24 hr tablet Take 1 tablet by mouth daily. 02/16/19  Yes [provider]  predniSONE (DELTASONE) 10 MG tablet Take 6 tablets (60 mg total) by mouth daily for 1 day, THEN 5 tablets (50 mg total) daily for 1 day, THEN 4 tablets (40 mg total) daily for 1 day, THEN 3 tablets (30 mg total) daily for 1 day, THEN 2 tablets (20 mg total) daily for 1 day, THEN 1 tablet (10 mg total) daily for 1 day. 07/21/23 07/27/23 Yes Mickie Bail, NP  albuterol (VENTOLIN HFA) 108 (90 Base) MCG/ACT inhaler INHALE 2 PUFFS BY MOUTH EVERY 6 HOURS AS NEEDED Patient not taking: Reported on 08/29/2022 12/26/20 12/26/21  Barbette Reichmann, MD  amitriptyline (ELAVIL) 10 MG tablet TAKE 1 TABLET (10 MG TOTAL) BY MOUTH NIGHTLY Patient not taking: Reported on 08/29/2022 10/24/20 10/24/21  Barbette Reichmann, MD  amoxicillin (AMOXIL) 875 MG tablet Take 1 tablet (875 mg total) by mouth every 12 (twelve) hours for 10 days Patient  not taking: Reported on 08/29/2022 10/18/21     aspirin EC 81 MG tablet Take 81 mg by mouth daily.    [provider]  atorvastatin (LIPITOR) 10 MG tablet Take 1 tablet (10 mg total) by mouth daily at 6 PM. Patient taking differently: Take 10 mg by mouth every evening. 08/07/16   Reubin Milan, MD  atorvastatin (LIPITOR) 10 MG tablet TAKE 1 TABLET BY MOUTH ONCE DAILY 07/23/20 07/23/21  Barbette Reichmann, MD  atorvastatin (LIPITOR) 10 MG tablet Take 1 tablet (10 mg total) by mouth once daily for 180 days 07/26/21     atorvastatin (LIPITOR) 10 MG tablet TAKE 1 TABLET BY MOUTH ONCE DAILY 11/07/21     atorvastatin (LIPITOR) 10 MG tablet Take 1 tablet (10 mg total) by mouth once daily  04/13/23     azithromycin (ZITHROMAX) 250 MG tablet Take 2 tablets (500mg ) by mouth on Day 1. Take 1 tablet (250mg ) by mouth on Days 2-5. 12/10/21     bromocriptine (PARLODEL) 2.5 MG tablet 1/4 tab daily 10/19/18   Romero Belling, MD  Continuous Blood Gluc Sensor (DEXCOM G6 SENSOR) MISC Replace sensor every 10 days Patient not taking: Reported on 12/05/2022 10/28/21     Continuous Blood Gluc Sensor (DEXCOM G7 SENSOR) MISC Use 1 each every 10 (ten) days 05/05/22     Continuous Blood Gluc Sensor (FREESTYLE LIBRE 14 DAY SENSOR) MISC Use 1 Kit every 14 days 08/08/21     Continuous Blood Gluc Sensor (FREESTYLE LIBRE 14 DAY SENSOR) MISC USE AS DIRECTED EVERY 14 DAYS FOR GLUCOSE MONITORING. 08/09/21     Continuous Blood Gluc Sensor (FREESTYLE LIBRE 14 DAY SENSOR) MISC USE AS DIRECTED EVERY 14 DAYS FOR GLUCOSE MONITORING. 10/18/21     Continuous Blood Gluc Sensor (FREESTYLE LIBRE 2 SENSOR) MISC Use 1 kit every 14 (fourteen) days for glucose monitoring 10/23/21     Continuous Blood Gluc Transmit (DEXCOM G6 TRANSMITTER) MISC Replace transmitter every 90 days 10/28/21     dapagliflozin propanediol (FARXIGA) 10 MG TABS tablet Take 10 mg by mouth 1 (one) time each day 07/17/21     esomeprazole (NEXIUM) 20 MG capsule Take 1 capsule (20 mg total) by mouth daily. 12/15/22     famotidine (PEPCID) 20 MG tablet Take 1 tablet (20 mg total) by mouth daily. 12/15/22     FARXIGA 10 MG TABS tablet Take 1 tablet (10 mg total) by mouth once daily Patient not taking: Reported on 12/05/2022 02/27/22     FARXIGA 10 MG TABS tablet Take 1 tablet (10 mg total) by mouth once daily 09/05/22     FARXIGA 10 MG TABS tablet Take 1 tablet (10 mg total) by mouth daily. 03/13/23     gabapentin (NEURONTIN) 300 MG capsule TAKE 1 CAPSULE BY MOUTH TWICE DAILY AND 2 TABLETS AT BEDTIME Patient not taking: Reported on 08/29/2022 07/13/17   Reubin Milan, MD  glimepiride (AMARYL) 1 MG tablet Take 1 tablet (1 mg total) by mouth daily with breakfast. 12/17/18    Romero Belling, MD  glimepiride (AMARYL) 2 MG tablet Take 1 tablet (2 mg total) by mouth daily with breakfast 11/05/21     glimepiride (AMARYL) 4 MG tablet TAKE 1 TABLET (4 MG TOTAL) BY MOUTH DAILY WITH BREAKFAST 11/26/21     glimepiride (AMARYL) 4 MG tablet Take 1 tablet (4 mg total) by mouth daily with breakfast. 04/30/23     hydrALAZINE (APRESOLINE) 25 MG tablet TAKE 1 TABLET (25 MG TOTAL) BY MOUTH 2 (TWO) TIMES  DAILY 04/25/20 08/29/22  Barbette Reichmann, MD  hydrALAZINE (APRESOLINE) 25 MG tablet TAKE 1 TABLET BY MOUTH 2 TIMES DAILY 04/11/20 04/11/21  Barbette Reichmann, MD  hydrALAZINE (APRESOLINE) 25 MG tablet TAKE 1 TABLET BY MOUTH TWICE DAILY 08/23/21     hydrALAZINE (APRESOLINE) 25 MG tablet Take 1 tablet (25 mg total) by mouth 2 (two) times daily 02/27/22     HYDROcodone bit-homatropine (HYCODAN) 5-1.5 MG/5ML syrup Take 5 mLs by mouth every 6 (six) hours as needed for up to 10 days 12/10/21     hydrocortisone cream 1 % Apply topically 3 (three) times daily for 10 days 04/14/22     insulin degludec (TRESIBA FLEXTOUCH) 200 UNIT/ML FlexTouch Pen Use 38 units daily.  Titrate as needed to max daily dose of 60 units daily 04/08/21     insulin degludec (TRESIBA FLEXTOUCH) 200 UNIT/ML FlexTouch Pen Inject 64 units daily  Titrate as needed to max daily dose of 66 units daily 04/30/23     Insulin Pen Needle (BD PEN NEEDLE NANO 2ND GEN) 32G X 4 MM MISC Use one needle once daily 01/13/22     lisinopril (PRINIVIL,ZESTRIL) 20 MG tablet Take 20 mg by mouth daily.  11/26/17   [provider]  losartan (COZAAR) 25 MG tablet Take 1 tablet (25 mg total) by mouth 1 (one) time each day 04/02/23     mupirocin ointment (BACTROBAN) 2 % Apply 1 Application topically 3 (three) times daily for 10 to 14 days. 11/20/22   Jonell Cluck, PA-C  Na Sulfate-K Sulfate-Mg Sulf 17.5-3.13-1.6 GM/177ML SOLN Take 1 Bottle by mouth as directed One kit contains 2 bottles.  Take both bottles at the times instructed by your provider. 09/03/22      nortriptyline (PAMELOR) 25 MG capsule Take by mouth. 02/23/17 02/23/18  [provider]  olmesartan (BENICAR) 40 MG tablet Take 1 tablet (40 mg total) by mouth once daily 05/01/21     olmesartan (BENICAR) 40 MG tablet TAKE 1 TABLET BY MOUTH ONCE DAILY 05/02/21     olmesartan (BENICAR) 40 MG tablet TAKE 1 TABLET BY MOUTH ONCE DAILY 05/30/22     olmesartan (BENICAR) 40 MG tablet Take 1 tablet (40 mg total) by mouth daily. 12/15/22     polyethylene glycol-electrolytes (NULYTELY) 420 g solution Take 4,000 mLs by mouth as directed Patient not taking: Reported on 08/29/2022 05/26/22     polyethylene glycol-electrolytes (NULYTELY) 420 g solution Use as directed. 08/25/22     potassium citrate (UROCIT-K) 10 MEQ (1080 MG) SR tablet Take 1 tablet (10 mEq total) by mouth 3 (three) times daily. 11/06/21     potassium citrate (UROCIT-K) 10 MEQ (1080 MG) SR tablet Take 1 tablet (10 mEq total) by mouth 3 (three) times daily. 06/09/23     sildenafil (VIAGRA) 100 MG tablet Take 1 tablet (100 mg total) by mouth once daily as needed for Erectile Dysfunction for up to 30 days 12/27/21     silver sulfADIAZINE (SILVADENE) 1 % cream Apply 1 application topically daily. 04/12/20   Moshe Cipro, NP  testosterone (ANDROGEL) 50 MG/5GM (1%) GEL APPLY 1 TUBE (5 GM) TO SKIN ONCE DAILY AS DIRECTED 02/19/21 08/29/22  Barbette Reichmann, MD  testosterone (ANDROGEL) 50 MG/5GM (1%) GEL APPLY 1 TUBE (5 GM) TO SKIN ONCE DAILY AS DIRECTED 08/15/20 08/29/22  Crista Elliot, MD  testosterone (ANDROGEL) 50 MG/5GM (1%) GEL Apply 100 mg of testosterone topically once daily Apply to the shoulders and/or upper arms. 07/15/21     testosterone (  ANDROGEL) 50 MG/5GM (1%) GEL Place 10 g onto the skin daily. Apply to the shoulders and/or upper arms. 04/14/23     tirzepatide (MOUNJARO) 10 MG/0.5ML Pen Inject 10 mg into the skin once a week. 10/16/22     tirzepatide (MOUNJARO) 12.5 MG/0.5ML Pen Inject 12.5 mg into the skin once a week. 03/16/23      tirzepatide (MOUNJARO) 15 MG/0.5ML Pen Inject 15 mg into the skin every 7 (seven) days. 04/07/23     tirzepatide (MOUNJARO) 5 MG/0.5ML Pen Inject 0.5 mLs (5 mg total) subcutaneously every 7 (seven) days 06/12/22     tirzepatide (MOUNJARO) 5 MG/0.5ML Pen Inject 0.5 mLs (5 mg total) subcutaneously every 7 (seven) days 09/30/22     tirzepatide (MOUNJARO) 7.5 MG/0.5ML Pen Inject 0.5 mLs (7.5 mg total) subcutaneously every 7 (seven) days 10/16/22     traZODone (DESYREL) 50 MG tablet Take 50 mg by mouth at bedtime.  11/26/17 11/26/18  [provider]  triamcinolone cream (KENALOG) 0.1 % Apply 1 Application topically 2 (two) times daily as needed to involved areas. This should be targeted mainly at itchy of inflamed areas. Discontinue when problem resolves. 02/04/23     spironolactone (ALDACTONE) 25 MG tablet Take 1 tablet (25 mg total) by mouth 1 (one) time each day 12/13/21 06/26/22      Family History Family History  Problem Relation Age of Onset   Stroke Father    Nephrolithiasis Father    Diabetes type I Maternal Grandfather    Prostate cancer Neg Hx    Bladder Cancer Neg Hx     Social History Social History   Tobacco Use   Smoking status: Never   Smokeless tobacco: Never  Vaping Use   Vaping status: Never Used  Substance Use Topics   Alcohol use: No    Alcohol/week: 0.0 standard drinks of alcohol   Drug use: No     Allergies   Cymbalta [duloxetine hcl] and Ozempic (0.25 or 0.5 mg-dose) [semaglutide(0.25 or 0.5mg -dos)]   Review of Systems Review of Systems  Constitutional:  Negative for chills and fever.  Respiratory:  Negative for cough and shortness of breath.   Musculoskeletal:  Negative for arthralgias, gait problem and joint swelling.  Skin:  Positive for rash. Negative for color change.  Neurological:  Negative for weakness and numbness.     Physical Exam Triage Vital Signs ED Triage Vitals  Encounter Vitals Group     BP 07/21/23 1015 123/75     Systolic  BP Percentile --      Diastolic BP Percentile --      Pulse Rate 07/21/23 1008 81     Resp 07/21/23 1008 18     Temp 07/21/23 1008 98.2 F (36.8 C)     Temp src --      SpO2 07/21/23 1008 97 %     Weight --      Height --      Head Circumference --      Peak Flow --      Pain Score 07/21/23 1013 0     Pain Loc --      Pain Education --      Exclude from Growth Chart --    No data found.  Updated Vital Signs BP 123/75 (BP Location: Left Arm)   Pulse 81   Temp 98.2 F (36.8 C)   Resp 18   SpO2 97%   Visual Acuity Right Eye Distance:   Left Eye Distance:   Bilateral  Distance:    Right Eye Near:   Left Eye Near:    Bilateral Near:     Physical Exam Vitals and nursing note reviewed.  Constitutional:      General: He is not in acute distress.    Appearance: He is well-developed.  HENT:     Mouth/Throat:     Mouth: Mucous membranes are moist.  Cardiovascular:     Rate and Rhythm: Normal rate and regular rhythm.  Pulmonary:     Effort: Pulmonary effort is normal. No respiratory distress.  Musculoskeletal:        General: No swelling. Normal range of motion.     Cervical back: Neck supple.  Skin:    General: Skin is warm and dry.     Capillary Refill: Capillary refill takes less than 2 seconds.     Findings: Rash present.     Comments: Numerous scattered papular rash on lower extremities and abdomen, concentrated around lower legs.    Neurological:     General: No focal deficit present.     Mental Status: He is alert and oriented to person, place, and time.     Sensory: No sensory deficit.     Motor: No weakness.     Gait: Gait normal.  Psychiatric:        Mood and Affect: Mood normal.        Behavior: Behavior normal.      UC Treatments / Results  Labs (all labs ordered are listed, but only abnormal results are displayed) Labs Reviewed - No data to display  EKG   Radiology No results found.  Procedures Procedures (including critical care  time)  Medications Ordered in UC Medications - No data to display  Initial Impression / Assessment and Plan / UC Course  I have reviewed the triage vital signs and the nursing notes.  Pertinent labs & imaging results that were available during my care of the patient were reviewed by me and considered in my medical decision making (see chart for details).   Rash due to insect bites.  The rash appears to be chiggers.  Patient reports intense itching.  Treating with prednisone and Zyrtec.  Also prescribed hydroxyzine for bedtime use; precautions for drowsiness with this medication discussed.  Instructed patient to use calamine lotion also.  Directed him to follow-up with his PCP if his symptoms are not improving.  Education provided on rash and insect bites.  He agrees to plan of care.    Final Clinical Impressions(s) / UC Diagnoses   Final diagnoses:  Rash  Insect bite, unspecified site, initial encounter     Discharge Instructions      Take the prednisone and Zyrtec as directed.    Take the hydroxyzine at bedtime as needed for itching.  Do not drive, operate machinery, drink alcohol, or perform dangerous activities while taking this medication as it may cause drowsiness.  Follow up with your primary care provider if your symptoms are not improving.        ED Prescriptions     Medication Sig Dispense Auth. Provider   predniSONE (DELTASONE) 10 MG tablet Take 6 tablets (60 mg total) by mouth daily for 1 day, THEN 5 tablets (50 mg total) daily for 1 day, THEN 4 tablets (40 mg total) daily for 1 day, THEN 3 tablets (30 mg total) daily for 1 day, THEN 2 tablets (20 mg total) daily for 1 day, THEN 1 tablet (10 mg total) daily for 1 day. 21  tablet Mickie Bail, NP   cetirizine (ZYRTEC ALLERGY) 10 MG tablet Take 1 tablet (10 mg total) by mouth daily for 14 days. 14 tablet Mickie Bail, NP   hydrOXYzine (ATARAX) 25 MG tablet Take 1 tablet (25 mg total) by mouth at bedtime as needed. 10  tablet Mickie Bail, NP      PDMP not reviewed this encounter.   Mickie Bail, NP 07/21/23 1059

## 2023-07-21 NOTE — ED Triage Notes (Signed)
Pt presents with an itchy rash on bilateral legs and stomach x 3 days. Pt does report walking in the woods to look at a property.

## 2023-07-21 NOTE — Discharge Instructions (Addendum)
Take the prednisone and Zyrtec as directed.    Take the hydroxyzine at bedtime as needed for itching.  Do not drive, operate machinery, drink alcohol, or perform dangerous activities while taking this medication as it may cause drowsiness.  Follow up with your primary care provider if your symptoms are not improving.

## 2023-08-04 ENCOUNTER — Other Ambulatory Visit: Payer: Self-pay

## 2023-08-07 ENCOUNTER — Other Ambulatory Visit (HOSPITAL_COMMUNITY): Payer: Self-pay

## 2023-08-07 ENCOUNTER — Other Ambulatory Visit: Payer: Self-pay

## 2023-08-26 ENCOUNTER — Other Ambulatory Visit: Payer: Self-pay

## 2023-09-02 ENCOUNTER — Other Ambulatory Visit: Payer: Self-pay

## 2023-09-03 ENCOUNTER — Other Ambulatory Visit: Payer: Self-pay

## 2023-09-06 ENCOUNTER — Other Ambulatory Visit: Payer: Self-pay

## 2023-09-07 ENCOUNTER — Other Ambulatory Visit: Payer: Self-pay

## 2023-09-07 MED ORDER — CETIRIZINE HCL 10 MG PO TABS
ORAL_TABLET | ORAL | 0 refills | Status: DC
  Filled 2023-09-07: qty 14, 14d supply, fill #0

## 2023-09-29 ENCOUNTER — Other Ambulatory Visit (HOSPITAL_COMMUNITY): Payer: Self-pay

## 2023-09-29 ENCOUNTER — Other Ambulatory Visit: Payer: Self-pay

## 2023-09-29 MED ORDER — CETIRIZINE HCL 10 MG PO TABS
10.0000 mg | ORAL_TABLET | Freq: Every day | ORAL | 0 refills | Status: DC
  Filled 2023-09-29: qty 14, 14d supply, fill #0

## 2023-09-29 MED ORDER — AMOXICILLIN 875 MG PO TABS
875.0000 mg | ORAL_TABLET | Freq: Two times a day (BID) | ORAL | 0 refills | Status: AC
  Filled 2023-09-29: qty 20, 10d supply, fill #0

## 2023-10-09 ENCOUNTER — Other Ambulatory Visit: Payer: Self-pay

## 2023-10-26 ENCOUNTER — Other Ambulatory Visit (HOSPITAL_COMMUNITY): Payer: Self-pay

## 2023-10-26 ENCOUNTER — Other Ambulatory Visit: Payer: Self-pay

## 2023-10-27 ENCOUNTER — Other Ambulatory Visit: Payer: Self-pay

## 2023-10-28 ENCOUNTER — Other Ambulatory Visit: Payer: Self-pay

## 2023-10-28 MED ORDER — ATORVASTATIN CALCIUM 10 MG PO TABS
10.0000 mg | ORAL_TABLET | Freq: Every day | ORAL | 1 refills | Status: DC
  Filled 2023-10-28 – 2023-11-13 (×2): qty 90, 90d supply, fill #0
  Filled 2024-02-11 – 2024-02-24 (×2): qty 90, 90d supply, fill #1

## 2023-10-30 ENCOUNTER — Other Ambulatory Visit: Payer: Self-pay

## 2023-11-01 ENCOUNTER — Other Ambulatory Visit: Payer: Self-pay

## 2023-11-02 ENCOUNTER — Other Ambulatory Visit: Payer: Self-pay

## 2023-11-03 ENCOUNTER — Other Ambulatory Visit: Payer: Self-pay

## 2023-11-03 MED ORDER — CETIRIZINE HCL 10 MG PO TABS
10.0000 mg | ORAL_TABLET | Freq: Every day | ORAL | 0 refills | Status: AC
  Filled 2023-11-03 – 2024-03-21 (×2): qty 14, 14d supply, fill #0

## 2023-11-10 ENCOUNTER — Other Ambulatory Visit: Payer: Self-pay

## 2023-11-11 ENCOUNTER — Other Ambulatory Visit: Payer: Self-pay

## 2023-11-12 ENCOUNTER — Other Ambulatory Visit: Payer: Self-pay

## 2023-11-12 MED ORDER — TESTOSTERONE 50 MG/5GM (1%) TD GEL
10.0000 g | Freq: Every day | TRANSDERMAL | 5 refills | Status: DC
  Filled 2023-11-12: qty 150, 15d supply, fill #0
  Filled 2023-12-07: qty 150, 15d supply, fill #1
  Filled 2024-02-22 – 2024-02-23 (×2): qty 150, 15d supply, fill #2
  Filled 2024-03-09 (×2): qty 150, 15d supply, fill #3

## 2023-11-13 ENCOUNTER — Other Ambulatory Visit: Payer: Self-pay

## 2023-11-16 ENCOUNTER — Other Ambulatory Visit: Payer: Self-pay

## 2023-11-19 ENCOUNTER — Other Ambulatory Visit: Payer: Self-pay

## 2023-11-19 MED ORDER — MOUNJARO 15 MG/0.5ML ~~LOC~~ SOAJ
15.0000 mg | SUBCUTANEOUS | 6 refills | Status: DC
  Filled 2023-11-19: qty 2, 28d supply, fill #0
  Filled 2023-12-18: qty 2, 28d supply, fill #1
  Filled 2024-01-12: qty 2, 28d supply, fill #2
  Filled 2024-02-11: qty 2, 28d supply, fill #3
  Filled 2024-03-03: qty 2, 28d supply, fill #4
  Filled 2024-04-04: qty 2, 28d supply, fill #5
  Filled 2024-05-03: qty 2, 28d supply, fill #6

## 2023-12-07 ENCOUNTER — Other Ambulatory Visit: Payer: Self-pay

## 2023-12-08 ENCOUNTER — Other Ambulatory Visit: Payer: Self-pay

## 2023-12-08 MED ORDER — ESOMEPRAZOLE MAGNESIUM 20 MG PO CPDR
20.0000 mg | DELAYED_RELEASE_CAPSULE | Freq: Every day | ORAL | 1 refills | Status: DC
  Filled 2023-12-08: qty 90, 90d supply, fill #0
  Filled 2024-03-09 – 2024-04-08 (×3): qty 90, 90d supply, fill #1

## 2023-12-08 MED ORDER — POTASSIUM CITRATE ER 10 MEQ (1080 MG) PO TBCR
10.0000 meq | EXTENDED_RELEASE_TABLET | Freq: Three times a day (TID) | ORAL | 1 refills | Status: DC
  Filled 2023-12-08: qty 270, 90d supply, fill #0
  Filled 2024-03-09 – 2024-03-21 (×3): qty 270, 90d supply, fill #1

## 2023-12-18 ENCOUNTER — Other Ambulatory Visit: Payer: Self-pay

## 2024-01-12 ENCOUNTER — Other Ambulatory Visit: Payer: Self-pay

## 2024-02-11 ENCOUNTER — Other Ambulatory Visit: Payer: Self-pay

## 2024-02-12 ENCOUNTER — Other Ambulatory Visit: Payer: Self-pay

## 2024-02-12 MED ORDER — OLMESARTAN MEDOXOMIL 40 MG PO TABS
40.0000 mg | ORAL_TABLET | Freq: Every day | ORAL | 1 refills | Status: DC
  Filled 2024-02-12 – 2024-02-24 (×2): qty 90, 90d supply, fill #0
  Filled 2024-05-17: qty 90, 90d supply, fill #1

## 2024-02-14 ENCOUNTER — Other Ambulatory Visit: Payer: Self-pay

## 2024-02-22 ENCOUNTER — Other Ambulatory Visit: Payer: Self-pay

## 2024-02-23 ENCOUNTER — Other Ambulatory Visit: Payer: Self-pay

## 2024-02-24 ENCOUNTER — Other Ambulatory Visit: Payer: Self-pay

## 2024-03-03 ENCOUNTER — Other Ambulatory Visit: Payer: Self-pay

## 2024-03-09 ENCOUNTER — Other Ambulatory Visit: Payer: Self-pay

## 2024-03-09 MED ORDER — FARXIGA 10 MG PO TABS
10.0000 mg | ORAL_TABLET | Freq: Every day | ORAL | 3 refills | Status: AC
  Filled 2024-03-09 – 2024-03-21 (×2): qty 90, 90d supply, fill #0
  Filled 2024-06-15: qty 90, 90d supply, fill #1
  Filled 2024-09-15: qty 90, 90d supply, fill #2
  Filled 2024-12-15: qty 90, 90d supply, fill #3

## 2024-03-21 ENCOUNTER — Other Ambulatory Visit: Payer: Self-pay

## 2024-04-04 ENCOUNTER — Other Ambulatory Visit: Payer: Self-pay

## 2024-04-04 MED ORDER — LOSARTAN POTASSIUM 25 MG PO TABS
25.0000 mg | ORAL_TABLET | Freq: Every day | ORAL | 11 refills | Status: AC
  Filled 2024-04-04: qty 30, 30d supply, fill #0
  Filled 2024-05-03: qty 30, 30d supply, fill #1
  Filled 2024-06-15: qty 30, 30d supply, fill #2
  Filled 2024-07-18: qty 30, 30d supply, fill #3
  Filled 2024-08-24: qty 30, 30d supply, fill #4
  Filled 2024-12-15: qty 30, 30d supply, fill #5

## 2024-04-08 ENCOUNTER — Other Ambulatory Visit: Payer: Self-pay

## 2024-04-12 ENCOUNTER — Other Ambulatory Visit: Payer: Self-pay

## 2024-05-03 ENCOUNTER — Other Ambulatory Visit: Payer: Self-pay

## 2024-05-06 ENCOUNTER — Other Ambulatory Visit: Payer: Self-pay

## 2024-05-17 ENCOUNTER — Other Ambulatory Visit: Payer: Self-pay

## 2024-05-17 MED ORDER — GLIMEPIRIDE 4 MG PO TABS
4.0000 mg | ORAL_TABLET | Freq: Every day | ORAL | 3 refills | Status: AC
  Filled 2024-05-17: qty 90, 90d supply, fill #0
  Filled 2024-08-15: qty 90, 90d supply, fill #1
  Filled 2024-11-28: qty 90, 90d supply, fill #2

## 2024-05-23 ENCOUNTER — Other Ambulatory Visit: Payer: Self-pay

## 2024-05-23 MED ORDER — SILDENAFIL CITRATE 100 MG PO TABS
100.0000 mg | ORAL_TABLET | Freq: Every day | ORAL | 0 refills | Status: AC | PRN
  Filled 2024-05-23: qty 30, 30d supply, fill #0

## 2024-05-23 MED ORDER — TESTOSTERONE 50 MG/5GM (1%) TD GEL
5.0000 g | Freq: Every day | TRANSDERMAL | 5 refills | Status: DC
  Filled 2024-05-23: qty 150, 30d supply, fill #0
  Filled 2024-06-22 (×2): qty 150, 30d supply, fill #1
  Filled 2024-08-15: qty 150, 30d supply, fill #2
  Filled 2024-09-22: qty 150, 30d supply, fill #3
  Filled 2024-11-01: qty 150, 30d supply, fill #4

## 2024-05-25 ENCOUNTER — Other Ambulatory Visit: Payer: Self-pay

## 2024-05-26 ENCOUNTER — Other Ambulatory Visit: Payer: Self-pay

## 2024-05-26 MED ORDER — ATORVASTATIN CALCIUM 10 MG PO TABS
10.0000 mg | ORAL_TABLET | Freq: Every day | ORAL | 0 refills | Status: DC
  Filled 2024-05-26: qty 30, 30d supply, fill #0

## 2024-05-30 ENCOUNTER — Other Ambulatory Visit: Payer: Self-pay

## 2024-05-30 MED ORDER — MOUNJARO 15 MG/0.5ML ~~LOC~~ SOAJ
15.0000 mg | SUBCUTANEOUS | 6 refills | Status: DC
  Filled 2024-05-30 (×2): qty 2, 28d supply, fill #0
  Filled 2024-06-26: qty 2, 28d supply, fill #1
  Filled 2024-07-28: qty 2, 28d supply, fill #2
  Filled 2024-08-24: qty 2, 28d supply, fill #3

## 2024-05-31 ENCOUNTER — Other Ambulatory Visit (HOSPITAL_BASED_OUTPATIENT_CLINIC_OR_DEPARTMENT_OTHER): Payer: Self-pay

## 2024-06-15 ENCOUNTER — Other Ambulatory Visit: Payer: Self-pay

## 2024-06-15 MED ORDER — ATORVASTATIN CALCIUM 10 MG PO TABS
10.0000 mg | ORAL_TABLET | Freq: Every day | ORAL | 0 refills | Status: DC
  Filled 2024-06-15 – 2024-06-22 (×2): qty 30, 30d supply, fill #0

## 2024-06-22 ENCOUNTER — Other Ambulatory Visit: Payer: Self-pay

## 2024-06-22 MED ORDER — LOSARTAN POTASSIUM 25 MG PO TABS
25.0000 mg | ORAL_TABLET | Freq: Every day | ORAL | 1 refills | Status: AC
  Filled 2024-06-22 – 2024-09-22 (×2): qty 90, 90d supply, fill #0
  Filled 2024-12-15 (×2): qty 90, 90d supply, fill #1

## 2024-06-26 ENCOUNTER — Other Ambulatory Visit: Payer: Self-pay

## 2024-06-27 ENCOUNTER — Other Ambulatory Visit: Payer: Self-pay

## 2024-06-27 MED ORDER — CETIRIZINE HCL 10 MG PO TABS
10.0000 mg | ORAL_TABLET | Freq: Every day | ORAL | 0 refills | Status: DC
  Filled 2024-06-27: qty 14, 14d supply, fill #0

## 2024-07-04 ENCOUNTER — Other Ambulatory Visit: Payer: Self-pay

## 2024-07-04 MED ORDER — ESOMEPRAZOLE MAGNESIUM 20 MG PO CPDR
20.0000 mg | DELAYED_RELEASE_CAPSULE | Freq: Every day | ORAL | 1 refills | Status: DC
  Filled 2024-07-04: qty 90, 90d supply, fill #0
  Filled 2024-07-18 – 2024-09-22 (×2): qty 90, 90d supply, fill #1

## 2024-07-07 ENCOUNTER — Other Ambulatory Visit: Payer: Self-pay

## 2024-07-07 ENCOUNTER — Emergency Department
Admission: EM | Admit: 2024-07-07 | Discharge: 2024-07-07 | Disposition: A | Discharge status: Home / Self Care | Attending: Emergency Medicine | Admitting: Emergency Medicine

## 2024-07-07 DIAGNOSIS — H5710 Ocular pain, unspecified eye: Secondary | ICD-10-CM | POA: Diagnosis present

## 2024-07-07 DIAGNOSIS — X19XXXA Contact with other heat and hot substances, initial encounter: Secondary | ICD-10-CM | POA: Diagnosis not present

## 2024-07-07 DIAGNOSIS — T2662XA Corrosion of cornea and conjunctival sac, left eye, initial encounter: Secondary | ICD-10-CM

## 2024-07-07 DIAGNOSIS — T2612XA Burn of cornea and conjunctival sac, left eye, initial encounter: Secondary | ICD-10-CM | POA: Diagnosis not present

## 2024-07-07 DIAGNOSIS — I10 Essential (primary) hypertension: Secondary | ICD-10-CM | POA: Insufficient documentation

## 2024-07-07 DIAGNOSIS — T542X1A Toxic effect of corrosive acids and acid-like substances, accidental (unintentional), initial encounter: Secondary | ICD-10-CM | POA: Diagnosis not present

## 2024-07-07 DIAGNOSIS — E119 Type 2 diabetes mellitus without complications: Secondary | ICD-10-CM | POA: Insufficient documentation

## 2024-07-07 MED ORDER — FLUORESCEIN SODIUM 1 MG OP STRP
1.0000 | ORAL_STRIP | Freq: Once | OPHTHALMIC | Status: AC
  Administered 2024-07-07: 1 via OPHTHALMIC
  Filled 2024-07-07: qty 1

## 2024-07-07 MED ORDER — OFLOXACIN 0.3 % OP SOLN
2.0000 [drp] | Freq: Four times a day (QID) | OPHTHALMIC | Status: AC
  Administered 2024-07-07: 2 [drp] via OPHTHALMIC
  Filled 2024-07-07: qty 5

## 2024-07-07 MED ORDER — TETRACAINE HCL 0.5 % OP SOLN
1.0000 [drp] | Freq: Once | OPHTHALMIC | Status: AC
  Administered 2024-07-07: 1 [drp] via OPHTHALMIC
  Filled 2024-07-07: qty 4
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
## 2024-07-07 NOTE — ED Provider Notes (Signed)
 [facility] Provider Note    Event Date/Time   First MD Initiated Contact with Patient 07/07/24 1827     (approximate)   History   Eye Pain    HPI  Brian Butler is a 58 y.o. male    with a past medical history of diabetes type 2, metal foreign body in neck, costochondritis, spondylolysis, who presents to the ED complaining of getting acid in  his left thigh. According to the patient, he got muriatic  acid on his left eye.  Patient did wash twice with water.  Patient denies sensation of foreign body on his eye.  Patient is here by himself     Patient Active Problem List   Diagnosis Date Noted   Degenerative disc disease, cervical 01/21/2018   S/P cervical spinal fusion 01/21/2018   Cervicalgia 01/21/2018   Chronic pain syndrome 01/21/2018   Diabetes (HCC) 09/18/2016   Biceps tendonitis on left 08/07/2016   Pigmented skin lesion of uncertain nature 08/07/2016   Neck muscle spasm 08/07/2016   Plantar fasciitis 05/30/2016   Chronic heel pain 05/30/2016   Hyperlipidemia associated with type 2 diabetes mellitus (HCC) 04/30/2016   Chronic low back pain 04/30/2016   Overweight (BMI 25.0-29.9) 04/30/2016   Abnormal nuclear cardiac imaging test 04/23/2016   Unstable angina (HCC)    Essential hypertension 09/05/2015   Nephrolithiasis 09/05/2015   Hx of hypotestosteronemia 09/05/2015     ROS: Patient currently denies any vision changes, tinnitus, difficulty speaking, facial droop, sore throat, chest pain, shortness of breath, abdominal pain, nausea/vomiting/diarrhea, dysuria, or weakness/numbness/paresthesias in any extremity   Physical Exam   Triage Vital Signs: ED Triage Vitals  Encounter Vitals Group     BP 07/07/24 1823 (!) 152/72     Girls Systolic BP Percentile --      Girls Diastolic BP Percentile --      Boys Systolic BP Percentile --      Boys Diastolic BP Percentile --      Pulse Rate 07/07/24 1823 (!) 113     Resp 07/07/24 1823 20      Temp 07/07/24 1823 98.4 F (36.9 C)     Temp Source 07/07/24 1823 Oral     SpO2 07/07/24 1823 98 %     Weight --      Height --      Head Circumference --      Peak Flow --      Pain Score 07/07/24 1825 0     Pain Loc --      Pain Education --      Exclude from Growth Chart --     Most recent vital signs: Vitals:   07/07/24 1823  BP: (!) 152/72  Pulse: (!) 113  Resp: 20  Temp: 98.4 F (36.9 C)  SpO2: 98%     Physical Exam Vitals and nursing note reviewed.  During triage patient was hypertensive and tachycardic  Constitutional:      General: Awake and alert. No acute distress.    Appearance: Normal appearance. The patient is normal weight.      Able to speak in complete sentences without cough or dyspnea  HENT:     Head: Normocephalic and atraumatic.     Mouth: Mucous membranes are moist.  Eyes:     General: PERRL. Normal EOMs          Conjunctiva/sclera: Conjunctivae normal.  Left eye: Erythema in the nasal angle.  Normal EOM Nose No congestion/rhinorrhea  CV:                  Good peripheral perfusion.  Regular rate and rhythm  Resp:               Normal effort.  Equal breath sounds bilaterally.  Abd:                 No distention.  Soft, nontender.  No rebound or guarding.  Musculoskeletal:        General: No swelling. Normal range of motion.  Skin:    General: Skin is warm and dry.     Capillary Refill: Capillary refill takes less than 2 seconds.     Findings: No rash.  Neurological:     Mental Status: The patient is awake and alert. MAE spontaneously. No gross focal neurologic deficits are appreciated.  Psychiatric Mood and affect are normal. Speech and behavior are normal.  ED Results / Procedures / Treatments   Labs (all labs ordered are listed, but only abnormal results are displayed) Labs Reviewed - No data to display   EKG     RADIOLOGY     PROCEDURES:  Critical Care performed:   .Foreign Body Removal  Date/Time: 07/07/2024 7:15  PM  Performed by: Janit Kast, PA-C Authorized by: Janit Kast, PA-C  Consent: Verbal consent obtained Consent given by: patient Patient understanding: patient states understanding of the procedure being performed Patient identity confirmed: verbally with patient Body area: eye Location details: left conjunctiva  Anesthesia: Local Anesthetic: tetracaine  drops  Sedation: Patient sedated: no  Patient restrained: no Localization method: eyelid eversion, magnification and slit lamp Eye examined with fluorescein . Fluorescein  uptake. Corneal abrasion size: small Corneal abrasion location: medial No residual rust ring present. Dressing: antibiotic drops Complexity: simple Post-procedure assessment: Muriatic acid.     MEDICATIONS ORDERED IN ED: Medications  ofloxacin  (OCUFLOX ) 0.3 % ophthalmic solution 2 drop (has no administration in time range)  fluorescein  ophthalmic strip 1 strip (1 strip Left Eye Given by Other 07/07/24 1851)  tetracaine  (PONTOCAINE) 0.5 % ophthalmic solution 1 drop (1 drop Left Eye Given by Other 07/07/24 1851)      IMPRESSION / MDM / ASSESSMENT AND PLAN / ED COURSE  I reviewed the triage vital signs and the nursing notes.  Differential diagnosis includes, but is not limited to, ocular trauma, chemical burn of the eye, foreign body, conjunctivitis  Patient's presentation is most consistent with acute, uncomplicated illness.  Brian Butler is a 58 y.o., male presents today with history of 2 hours of muriatic acid on his eye with posterior erythema and pain described as a burning sensation.  On physical exam there is evidence of left eye nasal angle erythema, conjunctival erythema. Plan Tetracaine  Eye irrigation with saline solution Fluorescein  Slit lamp to rule out corneal abrasion Ofloxacin  drops Referral to ophthalmology for follow-up. After eye irrigation with saline solution about 300 mL, pH was 7, with a sleep lab they were intake of  fluorescein  at 3.  Visual acuity:  Patient's diagnosis is consistent with chemical burn of left eye. I did not order any imaging or labs physical exam was reassuring. I did review the patient's allergies and medications.The patient is in stable and satisfactory condition for discharge home.  Patient will be discharged home without prescriptions.  I did give to the patient ofloxacin  drops and advised to apply 2 drops every 6 hours while he is awake.  Patient is to follow up with ophthalmology as needed or otherwise  directed. Patient is given ED precautions to return to the ED for any worsening or new symptoms. Discussed plan of care with patient, answered all of patient's questions, Patient agreeable to plan of care. Advised patient to take medications according to the instructions on the label. Discussed possible side effects of new medications. Patient verbalized understanding.  FINAL CLINICAL IMPRESSION(S) / ED DIAGNOSES   Final diagnoses:  Acid chemical burn of cornea and conjunctival sac of left eye     Rx / DC Orders   ED Discharge Orders     None        Note:  This document was prepared using Dragon voice recognition software and may include unintentional dictation errors.   Janit Kast, PA-C 07/07/24 Brian Claudene Rover, MD 07/07/24 Brian Butler

## 2024-07-14 ENCOUNTER — Other Ambulatory Visit: Payer: Self-pay

## 2024-07-14 MED ORDER — AMITRIPTYLINE HCL 10 MG PO TABS
10.0000 mg | ORAL_TABLET | Freq: Every day | ORAL | 3 refills | Status: DC
  Filled 2024-07-14: qty 30, 30d supply, fill #0
  Filled 2024-07-18 – 2024-08-15 (×2): qty 30, 30d supply, fill #1
  Filled 2024-09-22: qty 30, 30d supply, fill #2
  Filled 2024-11-01: qty 30, 30d supply, fill #3

## 2024-07-14 MED ORDER — POTASSIUM CITRATE ER 10 MEQ (1080 MG) PO TBCR
10.0000 meq | EXTENDED_RELEASE_TABLET | Freq: Three times a day (TID) | ORAL | 1 refills | Status: AC
  Filled 2024-07-14: qty 270, 90d supply, fill #0
  Filled 2024-07-18 – 2024-11-01 (×2): qty 270, 90d supply, fill #1

## 2024-07-18 ENCOUNTER — Other Ambulatory Visit: Payer: Self-pay

## 2024-07-18 MED ORDER — ATORVASTATIN CALCIUM 10 MG PO TABS
10.0000 mg | ORAL_TABLET | Freq: Every day | ORAL | 2 refills | Status: DC
  Filled 2024-07-18: qty 30, 30d supply, fill #0
  Filled 2024-08-24: qty 30, 30d supply, fill #1
  Filled 2024-09-22: qty 30, 30d supply, fill #2

## 2024-07-18 MED ORDER — CETIRIZINE HCL 10 MG PO TABS
10.0000 mg | ORAL_TABLET | Freq: Every day | ORAL | 2 refills | Status: AC
  Filled 2024-07-18: qty 30, 30d supply, fill #0
  Filled 2024-08-24: qty 30, 30d supply, fill #1
  Filled 2024-09-22: qty 30, 30d supply, fill #2

## 2024-07-28 ENCOUNTER — Other Ambulatory Visit: Payer: Self-pay

## 2024-08-15 ENCOUNTER — Other Ambulatory Visit: Payer: Self-pay

## 2024-08-24 ENCOUNTER — Other Ambulatory Visit: Payer: Self-pay

## 2024-09-01 ENCOUNTER — Other Ambulatory Visit: Payer: Self-pay

## 2024-09-01 MED ORDER — MOUNJARO 10 MG/0.5ML ~~LOC~~ SOAJ
0.5000 mL | SUBCUTANEOUS | 12 refills | Status: DC
  Filled 2024-09-01: qty 2, 28d supply, fill #0
  Filled 2024-09-22: qty 2, 28d supply, fill #1

## 2024-09-15 ENCOUNTER — Other Ambulatory Visit: Payer: Self-pay

## 2024-09-22 ENCOUNTER — Other Ambulatory Visit: Payer: Self-pay

## 2024-09-26 ENCOUNTER — Other Ambulatory Visit: Payer: Self-pay

## 2024-09-26 MED ORDER — MOUNJARO 12.5 MG/0.5ML ~~LOC~~ SOAJ
12.5000 mg | SUBCUTANEOUS | 12 refills | Status: AC
  Filled 2024-09-26: qty 2, 28d supply, fill #0
  Filled 2024-11-01: qty 2, 28d supply, fill #1
  Filled 2024-11-28: qty 2, 28d supply, fill #2
  Filled 2024-12-19: qty 2, 28d supply, fill #3
  Filled 2024-12-30: qty 2, 28d supply, fill #4

## 2024-09-29 ENCOUNTER — Other Ambulatory Visit: Payer: Self-pay

## 2024-09-29 MED ORDER — TRAZODONE HCL 50 MG PO TABS
50.0000 mg | ORAL_TABLET | Freq: Every day | ORAL | 0 refills | Status: DC
  Filled 2024-09-29: qty 30, 30d supply, fill #0

## 2024-11-01 ENCOUNTER — Other Ambulatory Visit: Payer: Self-pay

## 2024-11-01 MED ORDER — TRAZODONE HCL 50 MG PO TABS
50.0000 mg | ORAL_TABLET | Freq: Every day | ORAL | 0 refills | Status: DC
  Filled 2024-11-01: qty 30, 30d supply, fill #0

## 2024-11-01 MED ORDER — ATORVASTATIN CALCIUM 10 MG PO TABS
10.0000 mg | ORAL_TABLET | Freq: Every day | ORAL | 2 refills | Status: AC
  Filled 2024-11-01: qty 30, 30d supply, fill #0
  Filled 2024-11-28: qty 30, 30d supply, fill #1
  Filled 2024-12-30: qty 30, 30d supply, fill #2

## 2024-11-28 ENCOUNTER — Other Ambulatory Visit: Payer: Self-pay

## 2024-11-28 MED ORDER — AMITRIPTYLINE HCL 10 MG PO TABS
10.0000 mg | ORAL_TABLET | Freq: Every day | ORAL | 2 refills | Status: AC
  Filled 2024-11-28: qty 30, 30d supply, fill #0
  Filled 2024-12-30: qty 30, 30d supply, fill #1

## 2024-11-28 MED ORDER — TESTOSTERONE 50 MG/5GM (1%) TD GEL
5.0000 g | Freq: Every day | TRANSDERMAL | 5 refills | Status: AC
  Filled 2024-11-28: qty 150, 30d supply, fill #0
  Filled 2024-12-30: qty 150, 30d supply, fill #1

## 2024-11-28 MED ORDER — TRAZODONE HCL 50 MG PO TABS
50.0000 mg | ORAL_TABLET | Freq: Every day | ORAL | 2 refills | Status: AC
  Filled 2024-11-28: qty 30, 30d supply, fill #0
  Filled 2024-12-30: qty 30, 30d supply, fill #1

## 2024-11-29 ENCOUNTER — Other Ambulatory Visit: Payer: Self-pay

## 2024-12-15 ENCOUNTER — Other Ambulatory Visit: Payer: Self-pay

## 2024-12-16 ENCOUNTER — Other Ambulatory Visit: Payer: Self-pay

## 2024-12-16 MED ORDER — ESOMEPRAZOLE MAGNESIUM 20 MG PO CPDR
20.0000 mg | DELAYED_RELEASE_CAPSULE | Freq: Every day | ORAL | 1 refills | Status: AC
  Filled 2024-12-16: qty 90, 90d supply, fill #0

## 2024-12-19 ENCOUNTER — Other Ambulatory Visit: Payer: Self-pay

## 2024-12-19 MED ORDER — TRESIBA FLEXTOUCH 200 UNIT/ML ~~LOC~~ SOPN
64.0000 [IU] | PEN_INJECTOR | Freq: Every day | SUBCUTANEOUS | 11 refills | Status: AC
  Filled 2024-12-19: qty 9, 28d supply, fill #0

## 2024-12-30 ENCOUNTER — Other Ambulatory Visit: Payer: Self-pay
